# Patient Record
Sex: Female | Born: 1965 | Race: White | Hispanic: No | Marital: Married | Smoking: Former smoker
Health system: Southern US, Community
[De-identification: ages and names within clinical notes are randomized; demographics above are authoritative.]

## PROBLEM LIST (undated history)

## (undated) DIAGNOSIS — R109 Unspecified abdominal pain: Secondary | ICD-10-CM

## (undated) DIAGNOSIS — N63 Unspecified lump in unspecified breast: Secondary | ICD-10-CM

## (undated) DIAGNOSIS — R569 Unspecified convulsions: Secondary | ICD-10-CM

## (undated) DIAGNOSIS — L309 Dermatitis, unspecified: Secondary | ICD-10-CM

## (undated) DIAGNOSIS — N644 Mastodynia: Secondary | ICD-10-CM

## (undated) DIAGNOSIS — N61 Mastitis without abscess: Secondary | ICD-10-CM

## (undated) DIAGNOSIS — K589 Irritable bowel syndrome without diarrhea: Secondary | ICD-10-CM

## (undated) DIAGNOSIS — F32A Depression, unspecified: Secondary | ICD-10-CM

## (undated) DIAGNOSIS — N643 Galactorrhea not associated with childbirth: Secondary | ICD-10-CM

## (undated) DIAGNOSIS — N816 Rectocele: Secondary | ICD-10-CM

## (undated) DIAGNOSIS — L290 Pruritus ani: Secondary | ICD-10-CM

## (undated) DIAGNOSIS — F419 Anxiety disorder, unspecified: Secondary | ICD-10-CM

## (undated) DIAGNOSIS — G2579 Other drug induced movement disorders: Secondary | ICD-10-CM

## (undated) DIAGNOSIS — G9081 Serotonin syndrome: Secondary | ICD-10-CM

## (undated) DIAGNOSIS — K5790 Diverticulosis of intestine, part unspecified, without perforation or abscess without bleeding: Secondary | ICD-10-CM

## (undated) DIAGNOSIS — F329 Major depressive disorder, single episode, unspecified: Secondary | ICD-10-CM

## (undated) DIAGNOSIS — N189 Chronic kidney disease, unspecified: Secondary | ICD-10-CM

## (undated) DIAGNOSIS — R102 Pelvic and perineal pain unspecified side: Secondary | ICD-10-CM

## (undated) DIAGNOSIS — R6882 Decreased libido: Secondary | ICD-10-CM

## (undated) DIAGNOSIS — N8189 Other female genital prolapse: Secondary | ICD-10-CM

## (undated) DIAGNOSIS — D249 Benign neoplasm of unspecified breast: Secondary | ICD-10-CM

## (undated) DIAGNOSIS — IMO0002 Reserved for concepts with insufficient information to code with codable children: Secondary | ICD-10-CM

## (undated) HISTORY — PX: BREAST BIOPSY: SHX20

## (undated) HISTORY — PX: HERNIA REPAIR: SHX51

## (undated) HISTORY — DX: Reserved for concepts with insufficient information to code with codable children: IMO0002

## (undated) HISTORY — DX: Other female genital prolapse: N81.89

## (undated) HISTORY — DX: Galactorrhea not associated with childbirth: N64.3

## (undated) HISTORY — PX: OVARIAN CYST REMOVAL: SHX89

## (undated) HISTORY — DX: Rectocele: N81.6

## (undated) HISTORY — DX: Benign neoplasm of unspecified breast: D24.9

## (undated) HISTORY — PX: ABDOMINAL HYSTERECTOMY: SHX81

## (undated) HISTORY — DX: Pelvic and perineal pain unspecified side: R10.20

## (undated) HISTORY — DX: Decreased libido: R68.82

## (undated) HISTORY — DX: Unspecified abdominal pain: R10.9

## (undated) HISTORY — DX: Depression, unspecified: F32.A

## (undated) HISTORY — DX: Unspecified convulsions: R56.9

## (undated) HISTORY — DX: Major depressive disorder, single episode, unspecified: F32.9

## (undated) HISTORY — DX: Serotonin syndrome: G90.81

## (undated) HISTORY — PX: INCONTINENCE SURGERY: SHX676

## (undated) HISTORY — DX: Mastodynia: N64.4

## (undated) HISTORY — DX: Diverticulosis of intestine, part unspecified, without perforation or abscess without bleeding: K57.90

## (undated) HISTORY — DX: Mastitis without abscess: N61.0

## (undated) HISTORY — DX: Other drug induced movement disorders: G25.79

## (undated) HISTORY — DX: Pelvic and perineal pain: R10.2

## (undated) HISTORY — DX: Anxiety disorder, unspecified: F41.9

## (undated) HISTORY — DX: Irritable bowel syndrome, unspecified: K58.9

## (undated) HISTORY — DX: Unspecified lump in unspecified breast: N63.0

## (undated) HISTORY — DX: Pruritus ani: L29.0

## (undated) HISTORY — DX: Dermatitis, unspecified: L30.9

---

## 1998-04-10 ENCOUNTER — Other Ambulatory Visit: Admission: RE | Admit: 1998-04-10 | Discharge: 1998-04-10 | Payer: Self-pay | Admitting: Obstetrics and Gynecology

## 1998-08-04 ENCOUNTER — Other Ambulatory Visit: Admission: RE | Admit: 1998-08-04 | Discharge: 1998-08-04 | Payer: Self-pay | Admitting: Obstetrics & Gynecology

## 1999-02-21 ENCOUNTER — Inpatient Hospital Stay (HOSPITAL_COMMUNITY): Admission: AD | Admit: 1999-02-21 | Discharge: 1999-02-23 | Payer: Self-pay | Admitting: *Deleted

## 1999-02-25 ENCOUNTER — Encounter (HOSPITAL_COMMUNITY): Admission: RE | Admit: 1999-02-25 | Discharge: 1999-05-26 | Payer: Self-pay | Admitting: Gynecology

## 1999-08-18 ENCOUNTER — Other Ambulatory Visit: Admission: RE | Admit: 1999-08-18 | Discharge: 1999-08-18 | Payer: Self-pay | Admitting: *Deleted

## 2000-08-01 ENCOUNTER — Other Ambulatory Visit: Admission: RE | Admit: 2000-08-01 | Discharge: 2000-08-01 | Payer: Self-pay | Admitting: Obstetrics and Gynecology

## 2000-12-29 ENCOUNTER — Other Ambulatory Visit: Admission: RE | Admit: 2000-12-29 | Discharge: 2000-12-29 | Payer: Self-pay | Admitting: Obstetrics and Gynecology

## 2001-02-20 ENCOUNTER — Ambulatory Visit (HOSPITAL_COMMUNITY): Admission: RE | Admit: 2001-02-20 | Discharge: 2001-02-20 | Payer: Self-pay | Admitting: Obstetrics and Gynecology

## 2001-02-20 ENCOUNTER — Encounter: Payer: Self-pay | Admitting: Obstetrics and Gynecology

## 2001-07-13 ENCOUNTER — Inpatient Hospital Stay (HOSPITAL_COMMUNITY): Admission: AD | Admit: 2001-07-13 | Discharge: 2001-07-15 | Payer: Self-pay | Admitting: Obstetrics and Gynecology

## 2002-07-30 ENCOUNTER — Other Ambulatory Visit: Admission: RE | Admit: 2002-07-30 | Discharge: 2002-07-30 | Payer: Self-pay | Admitting: Obstetrics and Gynecology

## 2003-07-31 ENCOUNTER — Other Ambulatory Visit: Admission: RE | Admit: 2003-07-31 | Discharge: 2003-07-31 | Payer: Self-pay | Admitting: Obstetrics and Gynecology

## 2003-12-31 ENCOUNTER — Encounter: Payer: Self-pay | Admitting: Internal Medicine

## 2003-12-31 DIAGNOSIS — Z8719 Personal history of other diseases of the digestive system: Secondary | ICD-10-CM

## 2004-01-29 ENCOUNTER — Encounter: Admission: RE | Admit: 2004-01-29 | Discharge: 2004-01-29 | Payer: Self-pay | Admitting: Obstetrics and Gynecology

## 2004-02-05 ENCOUNTER — Ambulatory Visit: Payer: Self-pay | Admitting: Internal Medicine

## 2004-08-12 ENCOUNTER — Other Ambulatory Visit: Admission: RE | Admit: 2004-08-12 | Discharge: 2004-08-12 | Payer: Self-pay | Admitting: Obstetrics and Gynecology

## 2004-10-09 ENCOUNTER — Other Ambulatory Visit: Admission: RE | Admit: 2004-10-09 | Discharge: 2004-10-09 | Payer: Self-pay | Admitting: Obstetrics and Gynecology

## 2004-10-16 ENCOUNTER — Encounter: Admission: RE | Admit: 2004-10-16 | Discharge: 2004-10-16 | Payer: Self-pay | Admitting: Obstetrics and Gynecology

## 2005-08-23 ENCOUNTER — Other Ambulatory Visit: Admission: RE | Admit: 2005-08-23 | Discharge: 2005-08-23 | Payer: Self-pay | Admitting: Obstetrics and Gynecology

## 2005-11-01 ENCOUNTER — Encounter: Admission: RE | Admit: 2005-11-01 | Discharge: 2005-11-01 | Payer: Self-pay | Admitting: Obstetrics and Gynecology

## 2006-01-02 ENCOUNTER — Emergency Department (HOSPITAL_COMMUNITY): Admission: EM | Admit: 2006-01-02 | Discharge: 2006-01-02 | Payer: Self-pay | Admitting: Emergency Medicine

## 2006-06-17 ENCOUNTER — Encounter (INDEPENDENT_AMBULATORY_CARE_PROVIDER_SITE_OTHER): Payer: Self-pay | Admitting: *Deleted

## 2006-06-17 ENCOUNTER — Emergency Department (HOSPITAL_COMMUNITY): Admission: EM | Admit: 2006-06-17 | Discharge: 2006-06-17 | Payer: Self-pay | Admitting: Emergency Medicine

## 2006-06-23 ENCOUNTER — Ambulatory Visit: Payer: Self-pay | Admitting: Internal Medicine

## 2006-11-04 ENCOUNTER — Encounter: Admission: RE | Admit: 2006-11-04 | Discharge: 2006-11-04 | Payer: Self-pay | Admitting: Obstetrics and Gynecology

## 2006-12-09 ENCOUNTER — Ambulatory Visit: Payer: Self-pay | Admitting: Internal Medicine

## 2007-06-29 DIAGNOSIS — F329 Major depressive disorder, single episode, unspecified: Secondary | ICD-10-CM | POA: Insufficient documentation

## 2007-07-13 ENCOUNTER — Encounter: Admission: RE | Admit: 2007-07-13 | Discharge: 2007-07-13 | Payer: Self-pay | Admitting: Obstetrics and Gynecology

## 2007-11-24 ENCOUNTER — Encounter: Admission: RE | Admit: 2007-11-24 | Discharge: 2007-11-24 | Payer: Self-pay | Admitting: Obstetrics and Gynecology

## 2008-07-30 ENCOUNTER — Encounter: Admission: RE | Admit: 2008-07-30 | Discharge: 2008-07-30 | Payer: Self-pay | Admitting: Obstetrics and Gynecology

## 2008-09-23 ENCOUNTER — Telehealth: Payer: Self-pay | Admitting: Internal Medicine

## 2008-10-15 ENCOUNTER — Ambulatory Visit: Payer: Self-pay | Admitting: Internal Medicine

## 2008-10-15 DIAGNOSIS — K589 Irritable bowel syndrome without diarrhea: Secondary | ICD-10-CM

## 2008-10-21 ENCOUNTER — Ambulatory Visit: Payer: Self-pay | Admitting: Internal Medicine

## 2009-05-13 ENCOUNTER — Encounter (INDEPENDENT_AMBULATORY_CARE_PROVIDER_SITE_OTHER): Payer: Self-pay | Admitting: Obstetrics and Gynecology

## 2009-05-13 ENCOUNTER — Ambulatory Visit (HOSPITAL_COMMUNITY): Admission: RE | Admit: 2009-05-13 | Discharge: 2009-05-15 | Payer: Self-pay | Admitting: Obstetrics and Gynecology

## 2010-04-25 ENCOUNTER — Other Ambulatory Visit: Payer: Self-pay | Admitting: Obstetrics and Gynecology

## 2010-04-25 DIAGNOSIS — Z1239 Encounter for other screening for malignant neoplasm of breast: Secondary | ICD-10-CM

## 2010-04-26 ENCOUNTER — Encounter: Payer: Self-pay | Admitting: Obstetrics and Gynecology

## 2010-05-08 ENCOUNTER — Ambulatory Visit
Admission: RE | Admit: 2010-05-08 | Discharge: 2010-05-08 | Disposition: A | Discharge status: Home / Self Care | Payer: BC Managed Care – PPO | Source: Ambulatory Visit | Attending: Obstetrics and Gynecology | Admitting: Obstetrics and Gynecology

## 2010-05-08 DIAGNOSIS — Z1239 Encounter for other screening for malignant neoplasm of breast: Secondary | ICD-10-CM

## 2010-05-13 ENCOUNTER — Other Ambulatory Visit: Payer: Self-pay | Admitting: Obstetrics and Gynecology

## 2010-05-13 DIAGNOSIS — R928 Other abnormal and inconclusive findings on diagnostic imaging of breast: Secondary | ICD-10-CM

## 2010-05-20 ENCOUNTER — Other Ambulatory Visit: Payer: Self-pay | Admitting: Obstetrics and Gynecology

## 2010-05-20 ENCOUNTER — Ambulatory Visit
Admission: RE | Admit: 2010-05-20 | Discharge: 2010-05-20 | Disposition: A | Discharge status: Home / Self Care | Payer: BC Managed Care – PPO | Source: Ambulatory Visit | Attending: Obstetrics and Gynecology | Admitting: Obstetrics and Gynecology

## 2010-05-20 DIAGNOSIS — R928 Other abnormal and inconclusive findings on diagnostic imaging of breast: Secondary | ICD-10-CM

## 2010-05-20 DIAGNOSIS — N6310 Unspecified lump in the right breast, unspecified quadrant: Secondary | ICD-10-CM

## 2010-05-22 ENCOUNTER — Other Ambulatory Visit: Payer: Self-pay | Admitting: Obstetrics and Gynecology

## 2010-05-22 DIAGNOSIS — N6310 Unspecified lump in the right breast, unspecified quadrant: Secondary | ICD-10-CM

## 2010-05-25 ENCOUNTER — Other Ambulatory Visit: Payer: Self-pay | Admitting: Diagnostic Radiology

## 2010-05-25 ENCOUNTER — Ambulatory Visit
Admission: RE | Admit: 2010-05-25 | Discharge: 2010-05-25 | Disposition: A | Discharge status: Home / Self Care | Payer: BC Managed Care – PPO | Source: Ambulatory Visit | Attending: Obstetrics and Gynecology | Admitting: Obstetrics and Gynecology

## 2010-05-25 DIAGNOSIS — N6310 Unspecified lump in the right breast, unspecified quadrant: Secondary | ICD-10-CM

## 2010-06-24 LAB — CBC
Hemoglobin: 17.7 g/dL — ABNORMAL HIGH (ref 12.0–15.0)
MCHC: 33.8 g/dL (ref 30.0–36.0)
Platelets: 177 10*3/uL (ref 150–400)
RBC: 3.64 MIL/uL — ABNORMAL LOW (ref 3.87–5.11)
RBC: 5.2 MIL/uL — ABNORMAL HIGH (ref 3.87–5.11)
RDW: 12.7 % (ref 11.5–15.5)
WBC: 8.1 10*3/uL (ref 4.0–10.5)

## 2010-06-24 LAB — PROTIME-INR: INR: 0.94 (ref 0.00–1.49)

## 2010-06-24 LAB — BASIC METABOLIC PANEL
CO2: 32 mEq/L (ref 19–32)
Calcium: 8.3 mg/dL — ABNORMAL LOW (ref 8.4–10.5)
Calcium: 9.5 mg/dL (ref 8.4–10.5)
Creatinine, Ser: 0.72 mg/dL (ref 0.4–1.2)
GFR calc Af Amer: >60 mL/min (ref >60)
GFR calc non Af Amer: >60 mL/min (ref >60)
Glucose, Bld: 102 mg/dL — ABNORMAL HIGH (ref 70–99)
Glucose, Bld: 104 mg/dL — ABNORMAL HIGH (ref 70–99)
Sodium: 139 mEq/L (ref 135–145)

## 2010-06-24 LAB — DIFFERENTIAL
Basophils Absolute: 0 10*3/uL (ref 0.0–0.1)
Eosinophils Relative: 1 % (ref 0–5)
Lymphocytes Relative: 16 % (ref 12–46)
Lymphs Abs: 1.3 10*3/uL (ref 0.7–4.0)

## 2010-06-24 LAB — APTT: aPTT: 29 seconds (ref 24–37)

## 2010-06-24 LAB — TYPE AND SCREEN: Antibody Screen: NEGATIVE

## 2010-08-18 NOTE — Assessment & Plan Note (Signed)
[facility]                         GASTROENTEROLOGY OFFICE NOTE   NAME:[name], [name] MINARDI                 MRN:          [id number]  DATE:12/09/2006                            DOB:          [birth year]    [name] is a 45 year old white female who is followed by Dr. Jennelle Human  for severe depression.  We saw her initially in 2005 for nausea and  epigastric pain.  Upper endoscopy then showed minimal gastritis.  She  was treated with Zofran.  She has tried proton pump inhibitor without  much improvement.  She at that time was weighing only 103 pounds, she is  now up to about 113 pounds and did well for about 2 years but in the  last several months had exacerbation of severe depression and has  developed nausea, especially in the mornings which follows her through  the day.  There is no vomiting and there is no abdominal pain.  Upper  abdominal ultrasound was negative.  There has been no fever, no back  pain, no change in the bowel habits.   MEDICATIONS:  1. Mirena IUD.  2. Emsam patch 9 mg every 24 hours.  This is a MAO inhibitor which was      started about 3 weeks ago.  3. Zyprexa 7.5 mg p.o. daily.  4. Ativan 2 mg nightly.   She appeared depressed, crying.  LUNGS:  Clear to auscultation.  COR:  Normal S1, normal S2.  ABDOMEN:  Soft, nontender.  Normoactive bowel sounds, no distention, no  focal tenderness.  Liver edge at costal margin.  EXTREMITIES:  No edema.   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   PLAN:  1. Donnatal 1 p.o. b.i.d. to attempt to achieve an antispasmodic      effect.  2. Zofran 4 mg dispensed 30, one p.o. q. 8 hours p.r.n. nausea.  If no      better consider upper endoscopy.  3. Gaviscon p.r.n. indigestion.     Hedwig Morton. Juanda Chance, MD  Electronically Signed    DMB/MedQ  DD: 12/09/2006  DT: 12/09/2006  Job #: [id number]   cc:   [name], M.D.

## 2010-08-21 NOTE — Assessment & Plan Note (Signed)
[name]                         GASTROENTEROLOGY OFFICE NOTE   NAME:[name]                 MRN:          [id number]  DATE:06/23/2006                            DOB:          Nov 13, 1965    CHIEF COMPLAINT:  Nausea with dull pain in stomach.   HISTORY:  [name] is a 45 year old white woman previously seen by Dr.  Juanda [name] in 2005.  She had had nausea and epigastric pain at that time.  She had some minimal gastritis on an upper endoscopy.  She took AcipHex  and then an antispasmodic, which I think was hyoscyamine.  She could not  tolerate Reglan, and she improved, and had not been seen since.  More  recently, she developed problems.  She saw her primary care physician,  Dr. Caryl [name], who performed labs that included CBC, CMET, and lipase.  These were all normal.  She had extreme nausea with some light-  headedness and epigastric discomfort.  It was so bad, she went to the  emergency room on March 14.  She was evaluated there.  Repeat lipase was  normal.  An ultrasound was normal.  Dr. Juanda [name] was contacted and the  patient was offered an appointment at the office to follow up, and she  is here for that now.  Overall, she is significantly better, taking  Protonix 40 mg every day.  She is starting to eat better.  Light-  headedness is improved.   PAST MEDICAL HISTORY:  Notable for depression and anxiety, and those  things mentioned above.   FAMILY HISTORY:  Heart disease in her mother.  Father died of prostate  cancer.  Alcoholism in her family.   SOCIAL HISTORY:  She is married.  She lives with her husband, 2 sons,  and 1 daughter.  Review of our note shows that she has 1 child with  hyperactivity syndrome.  The patient relates that there is no real  change in stress level, but she considers her home life to be somewhat  stressful with the kids.  Occasional alcohol.  No tobacco or drugs.  She  informed me that I cared for her  sister-in-law when I was in practice in  [place], and in fact performed an endoscopy on the patient's husband to  look for celiac disease, as I had diagnosed it in her sister.  I do  remember them.   REVIEW OF SYSTEMS:  Eyeglasses.  Joint pain in the hands.  Night sweats,  usually with menses.  She has fatigue with her nausea.   PHYSICAL EXAM:  Height 5 feet 3-1/2 inches, weight 112 pounds, blood  pressure 120/70, pulse 72.  LUNGS:  Clear.  HEART:  S1, S2.  No rubs or gallops.  ABDOMEN:  Mildly tender in the epigastrium without organomegaly or mass.  LYMPHATICS:  No neck adenopathy.  EXTREMITIES:  No edema.  PSYCH:  She appears alert and oriented x3.  Does not appear anxious.  Affect is appropriate.   ASSESSMENT:  Epigastric pain and dyspepsia, nausea that is improving.  Workup is not revealing any serious cause at this time.  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  This is explained to the patient.  A viral or  bacterial gastritis could be causing this as well.  She does not really  have a lot of other constitutional symptoms, however.  The thought of  sprue comes to mind, but Dr. Juanda [name] took duodenal biopsies in the past  that were negative.   Note, she is on Cymbalta and a vitamin supplement.  Lorazepam at  bedtime.  Protonix.  She has a Mirena IUD.   There were no known drug allergies.  She has not had trouble with this  at night.  She takes her Ativan at bedtime.  She has not tried that  during the day.   PLAN:  1. Add Levbid.  The notes indicate that she did well with Levsin in      the past.  2. Should she continue to have symptoms, she should follow up with [place].      Dr. Juanda [name] would be available in the future.  She is on vacation      now.  I could see her if needed and Dr. Juanda [name] was not available.      Further plans pending clinical course.     Iva Boop, MD,FACG  Electronically Signed    CEG/MedQ  DD: 06/24/2006  DT: 06/24/2006  Job #: [id number]   cc:   [name] Morton. [name] Chance, MD

## 2010-08-21 NOTE — H&P (Signed)
[facility]  Patient:    [name], [name] Visit Number: [id number] MRN: [id number]          Service Type: OBS Location: [id number] Attending Physician:  Leonard Schwartz Dictated by:   Marcelle Smiling Clelia Croft, C.N.M. Admit Date:  07/13/2001                           History and Physical  DATE OF BIRTH:                09/14/1965  CHIEF COMPLAINT:              [name] is a 45 year old married white female, gravida 3 para 2, 0-0-2, at 38-2/7th weeks, who presents with regular uterine contractions this evening.  She denies leaking, bleeding, nausea and vomiting, headache, or visual disturbances.  Her pregnancy has been followed by the [facility] Certified Nurse Midwife service and has been remarkable for:                               1. History of depression.                               2. History of group B strep positive.                               3. History of PIH first pregnancy.                               4. Vulvar varicosities.                               5. Advanced maternal age, with no amnio.  HISTORY OF PRESENT PREGNANCY: She presented for care at approximately 10-1/[redacted] weeks gestation.  She stopped her Effexor and sleep medication both in early first trimester, and planned to follow up with her psychiatrist if depression increased.  At [redacted] weeks gestation she began with bilateral internal itching of the skin around her shoulder, with no evidence of external rash.  In the past she has had a rheumatoid work-up which was normal, as well as normal liver function tests.  She was given Claritin as well as hydrocortisone cream to use.  The itching continued on her shoulder.  At approximately [redacted] weeks gestation she was using Ben-Gay for comfort.  Her ultrasound at 18 weeks was done at [facility] of [place] and was within normal limits.  Vulvar varicosities progressively worsened during the pregnancy.   She was given a Z-Pak for productive cough at approximately [redacted] weeks gestation.  The rest of her prenatal care was unremarkable.  OBSTETRICAL LABORATORY DATA:  Collected on December 29, 2000, hemoglobin 15.1, hematocrit 45.5; platelets 185,000.  Blood type AB-positive.  Antibody negative.  RPR nonreactive.  Rubella immune.  Hepatitis B surface antigen negative.  Pap smear within normal limits.  Gonorrhea negative, Chlamydia negative.  On January 26, 2001 maternal serum alpha-fetoprotein was within normal range.  On April 19, 2001 her one hour glucola was 96 and her hemoglobin was 14.3.  Culture of the vaginal tract for group B strep on June 29, 2001 was positive.  OBSTETRICAL HISTORY:          1. She is gravida 3 para 2, 0-0-2.                               2. In 1998 she delivered a female infant via                                  vacuum extraction at [redacted] weeks gestation                                  after eight hours of labor.  He weighed                                  7 pounds 7 ounces.  His name is [name].                                  She had increased blood pressure the third                                  trimester with that pregnancy.  She was                                  positive for group B strep and also had a                                  fourth degree laceration.                               3. In November 2000 she vaginally delivered a                                  female infant at 38-5/7th weeks gestation                                  after four hours of labor.  Infant weighed                                  7 pounds 3 ounces.  Her name if [name].                                  She was positive for group B strep with                                  that pregnancy as well.  4. She has also had a history postpartum                                  depression.  ALLERGIES:                    No medication  allergies.  PAST MEDICAL HISTORY:         1. She reports using condoms for contraception                                  in the past.                               2. She has had a previous abnormal Pap smear                                  approximately three to five years ago, with                                  repeats normal.                               3. She reports having had the usual childhood                                  illnesses.                               4. She has had urinary tract infection x2 in the                                  past.                               5. History of depression, diagnosed at age 69                                  and followed by Dr. Jennelle Human, and has                                  usually taken Effexor as her primary                                  medication.                               6. The patient has been a previous smoker, and  stopped in 1996.  PAST SURGICAL HISTORY:        1. Wisdom teeth extraction at the age of 67.                               2. Hernia repair in 1986.  FAMILY HISTORY:               Remarkable for family member with angioplasty x2.  Maternal grandmother with borderline diabetes.  Paternal grandfather with rheumatoid arthritis.  Father with prostate cancer.  Father with history of depression and bipolar.  Siblings with bipolar.  Siblings with alcohol, tobacco, and drug use.  GENETIC HISTORY:              Remarkable for the patient being age 1 at the time of delivery.  SOCIAL HISTORY:               The patient is married to the father of the baby.  He is involved and supportive.  His name is Thelma Barge.  They are of the [place] faith.  They are both college educated.  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  They deny any alcohol, tobacco, or illicit drug use with the pregnancy.  PHYSICAL EXAMINATION:  VITAL SIGNS:                   Stable.  She is afebrile.  HEENT:                        Grossly within normal limits.  CHEST:                        Clear to auscultation.   HEART:                        Regular rate and rhythm.  BREAST:                       Soft, nontender.  ABDOMEN:                      Gravid contour, with reassuring fetal heart rate, as well as uterine contractions every three to four minutes.  PELVIC:                       Cervical examination is 5 cm, 80%, vertex presentation with a bulging bag of water per R.N. examination.  EXTREMITIES:                  Within normal limits.  ASSESSMENT:                   1. Intrauterine pregnancy at term.                               2. Active labor.                               3. Group B streptococci positive.  PLAN:                         1. Admit to birthing suite per consult  with Dr. Stefano Gaul.                               2. Routine CNM orders.                               3. Penicillin G for group B strep                                  prophylaxis.                               4. Plans epidural.                               5. Anticipated normal spontaneous vaginal                                  birth. Dictated by:   Marcelle Smiling Clelia Croft, C.N.M. Attending Physician:  Leonard Schwartz DD:  07/13/01 TD:  07/13/01 Job: [id number]

## 2011-05-18 ENCOUNTER — Other Ambulatory Visit: Payer: Self-pay | Admitting: Obstetrics and Gynecology

## 2011-05-18 DIAGNOSIS — Z1231 Encounter for screening mammogram for malignant neoplasm of breast: Secondary | ICD-10-CM

## 2011-06-08 ENCOUNTER — Ambulatory Visit: Payer: BC Managed Care – PPO

## 2011-08-05 ENCOUNTER — Ambulatory Visit
Admission: RE | Admit: 2011-08-05 | Discharge: 2011-08-05 | Disposition: A | Discharge status: Home / Self Care | Payer: BC Managed Care – PPO | Source: Ambulatory Visit | Attending: Obstetrics and Gynecology | Admitting: Obstetrics and Gynecology

## 2011-08-05 DIAGNOSIS — Z1231 Encounter for screening mammogram for malignant neoplasm of breast: Secondary | ICD-10-CM

## 2011-08-27 ENCOUNTER — Telehealth: Payer: Self-pay | Admitting: Obstetrics and Gynecology

## 2011-08-27 NOTE — Telephone Encounter (Signed)
Triage/epic 

## 2011-09-10 ENCOUNTER — Telehealth: Payer: Self-pay

## 2011-09-29 ENCOUNTER — Telehealth: Payer: Self-pay

## 2011-09-29 NOTE — Telephone Encounter (Signed)
Lm for pt to call back 09/10/11, no response from pt.

## 2011-09-29 NOTE — Telephone Encounter (Signed)
Lm for pt to call back on 09/10/11, no response from pt.

## 2011-10-27 ENCOUNTER — Encounter: Payer: Self-pay | Admitting: Obstetrics and Gynecology

## 2012-01-03 ENCOUNTER — Ambulatory Visit (INDEPENDENT_AMBULATORY_CARE_PROVIDER_SITE_OTHER): Payer: BC Managed Care – PPO | Admitting: Obstetrics and Gynecology

## 2012-01-03 ENCOUNTER — Ambulatory Visit (INDEPENDENT_AMBULATORY_CARE_PROVIDER_SITE_OTHER): Payer: BC Managed Care – PPO

## 2012-01-03 ENCOUNTER — Encounter: Payer: Self-pay | Admitting: Obstetrics and Gynecology

## 2012-01-03 ENCOUNTER — Other Ambulatory Visit: Payer: Self-pay | Admitting: Obstetrics and Gynecology

## 2012-01-03 VITALS — BP 94/70 | Temp 98.1°F | Wt 124.0 lb

## 2012-01-03 DIAGNOSIS — R3915 Urgency of urination: Secondary | ICD-10-CM

## 2012-01-03 DIAGNOSIS — K594 Anal spasm: Secondary | ICD-10-CM

## 2012-01-03 DIAGNOSIS — N83209 Unspecified ovarian cyst, unspecified side: Secondary | ICD-10-CM

## 2012-01-03 DIAGNOSIS — R102 Pelvic and perineal pain: Secondary | ICD-10-CM

## 2012-01-03 DIAGNOSIS — N949 Unspecified condition associated with female genital organs and menstrual cycle: Secondary | ICD-10-CM

## 2012-01-03 LAB — POCT URINALYSIS DIPSTICK
Leukocytes, UA: NEGATIVE
Nitrite, UA: NEGATIVE
Protein, UA: NEGATIVE
Urobilinogen, UA: NEGATIVE

## 2012-01-03 NOTE — Progress Notes (Signed)
46 YO complains of back pain, bloating, urinary urgency (no incontinence) and rectal pain x several months-off & on.  Seemingly bloating is worse at night (no change in diet) and sometimes it is difficult to start urine flow though often has urgency. Over the past year will monthly have severe spasm of rectal area and has to sit on toilet and do deep breathing to relieve it.  PMH: LAVH/TVT/A-P repair/left ovarian cystectomy- 2011, PMS, galactorrhea O:  U/A-negative  Abdomen: Pelvic: EGBUS-wnl, vagina-normal, uterus/cervix-surgically absent, right adnexal tenderness without masses, left adnexa-no tenderness Anus: fissures    A:  Proctalgia Fugax      Pelvic Discomfort      Bloating      Urinary Urgency/Hesitancy      Lower Back Pain      Complex Right Ovarian Follicle (history of ovarian cyst)  P: Pelvic U/S-uterus/cervix-surgically absent, normal appearing left ovary, right ovary with complex follicle-2.4 x 1.8 x 2.0 cm      Preparation-H as directed      Follow up with GI for proctalgia and bloating symtpoms     To consult Dr. Normand Sloop about further management of urinary symptoms      Repeat U/S in 4-6 weeks      RTO-as scheduled or prn  Danene Montijo, PA-C

## 2012-01-03 NOTE — Patient Instructions (Signed)
Proctalgia Fugax Proctalgia fugax is a very short episode of intense rectal pain. It can last from seconds to minutes. It often occurs in the night, and awakens the person from sleep. It is not a sign of cancer.  CAUSES  The cause of this often intense rectal pain is not known. One possible cause may be spasm of the pelvic muscles or of the lowest part of the large intestine.  SYMPTOMS  The pain of proctalgia fugax:  Is intensely severe.   Lasts from only a few seconds to thirty minutes.   Usually awakens the person from sleep.  DIAGNOSIS  In order to make sure that there are no other problems, diagnostic tests may be done such as:   Anoscopy. This is a lighted scope that is put into the rectum to look for abnormalities.   Barium enema. X-rays are taken after administering a radio-sensitive material.  TREATMENT  A number of things have been used to try to treat this condition, including:  Medications.   Warm baths.   Relaxation techniques.   Gentle massage of the painful area.  HOME CARE INSTRUCTIONS   Take all medications exactly as directed.   Follow any prescribed diet.   Follow instructions regarding both rest and physical activity.   Learn progressive relaxation techniques.  SEEK IMMEDIATE MEDICAL CARE IF:   Your pain does not get better in the usual amount of time.   You develop any new symptoms.  Document Released: 12/15/2000 Document Revised: 03/11/2011 Document Reviewed: 05/23/2008 [facility] Patient Information 2012 [place], [place].Anal Fissure, Adult An anal fissure is a small tear or crack in the skin around the anus. Bleeding from a fissure usually stops on its own within a few minutes. However, bleeding will often reoccur with each bowel movement until the crack heals.  CAUSES   Passing large, hard stools.   Frequent diarrheal stools.   Constipation.   Inflammatory bowel disease (Crohn's disease or ulcerative colitis).   Infections.   Anal sex.    SYMPTOMS   Small amounts of blood seen on your stools, on toilet paper, or in the toilet after a bowel movement.   Rectal bleeding.   Painful bowel movements.   Itching or irritation around the anus.  DIAGNOSIS Your caregiver will examine the anal area. An anal fissure can usually be seen with careful inspection. A rectal exam may be performed and a short tube (anoscope) may be used to examine the anal canal. TREATMENT   You may be instructed to take fiber supplements. These supplements can soften your stool to help make bowel movements easier.   Sitz baths may be recommended to help heal the tear. Do not use soap in the sitz baths.   A medicated cream or ointment may be prescribed to lessen discomfort.  HOME CARE INSTRUCTIONS   Maintain a diet high in fruits, whole grains, and vegetables. Avoid constipating foods like bananas and dairy products.   Take sitz baths as directed by your caregiver.   Drink enough fluids to keep your urine clear or pale yellow.   Only take over-the-counter or prescription medicines for pain, discomfort, or fever as directed by your caregiver. Do not take aspirin as this may increase bleeding.   Do not use ointments containing numbing medications (anesthetics) or hydrocortisone. They could slow healing.  SEEK MEDICAL CARE IF:   Your fissure is not completely healed within 3 days.   You have further bleeding.   You have a fever.   You have  diarrhea mixed with blood.   You have pain.   Your problem is getting worse rather than better.  MAKE SURE YOU:   Understand these instructions.   Will watch your condition.   Will get help right away if you are not doing well or get worse.  Document Released: 03/22/2005 Document Revised: 03/11/2011 Document Reviewed: 09/06/2010 [facility] Patient Information 2012 [place], [place].

## 2012-02-15 ENCOUNTER — Ambulatory Visit (INDEPENDENT_AMBULATORY_CARE_PROVIDER_SITE_OTHER): Payer: BC Managed Care – PPO | Admitting: Obstetrics and Gynecology

## 2012-02-15 ENCOUNTER — Encounter: Payer: Self-pay | Admitting: Obstetrics and Gynecology

## 2012-02-15 ENCOUNTER — Ambulatory Visit (INDEPENDENT_AMBULATORY_CARE_PROVIDER_SITE_OTHER): Payer: BC Managed Care – PPO

## 2012-02-15 VITALS — BP 102/70 | HR 70 | Wt 125.0 lb

## 2012-02-15 DIAGNOSIS — R102 Pelvic and perineal pain unspecified side: Secondary | ICD-10-CM

## 2012-02-15 DIAGNOSIS — N83201 Unspecified ovarian cyst, right side: Secondary | ICD-10-CM

## 2012-02-15 DIAGNOSIS — N949 Unspecified condition associated with female genital organs and menstrual cycle: Secondary | ICD-10-CM

## 2012-02-15 DIAGNOSIS — N83209 Unspecified ovarian cyst, unspecified side: Secondary | ICD-10-CM

## 2012-02-15 NOTE — Progress Notes (Signed)
46 YO seen in September with proctalgia fugax, pelvic pain & urinary urgency.  States that pain is some what less and has not had any change in urgency and no further rectal spasms since last visit.  O: U/S: uterus/cervix-surgically absent; right ovary with a simple dominant follicle 2.4 x 1.9 x 1.8 cm (virtually unchanged from September study except      the cyst has a simple instead of a complex component; left ovary not visualized   A: Stable Right Ovarian Cyst     Decreased Pelvic Discomfort  P:  Notify office of any worsening symptoms       Repeat U/S in 8 weeks      RTO-as scheduled or prn  Johnathan Heskett, PA-C

## 2012-04-11 ENCOUNTER — Other Ambulatory Visit: Payer: BC Managed Care – PPO

## 2012-04-11 ENCOUNTER — Encounter: Payer: BC Managed Care – PPO | Admitting: Obstetrics and Gynecology

## 2012-06-29 ENCOUNTER — Other Ambulatory Visit: Payer: Self-pay

## 2012-06-29 DIAGNOSIS — Z1231 Encounter for screening mammogram for malignant neoplasm of breast: Secondary | ICD-10-CM

## 2012-08-08 ENCOUNTER — Ambulatory Visit: Payer: Self-pay

## 2012-08-31 ENCOUNTER — Ambulatory Visit (INDEPENDENT_AMBULATORY_CARE_PROVIDER_SITE_OTHER): Payer: BC Managed Care – PPO | Admitting: Neurology

## 2012-08-31 ENCOUNTER — Encounter: Payer: Self-pay | Admitting: Neurology

## 2012-08-31 VITALS — BP 120/75 | HR 94 | Ht 63.75 in | Wt 117.0 lb

## 2012-08-31 DIAGNOSIS — R209 Unspecified disturbances of skin sensation: Secondary | ICD-10-CM | POA: Insufficient documentation

## 2012-08-31 MED ORDER — GABAPENTIN 300 MG PO CAPS: 300.0000 mg | ORAL_CAPSULE | Freq: Every day | ORAL | Status: DC
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
## 2012-09-05 ENCOUNTER — Ambulatory Visit
Admission: RE | Admit: 2012-09-05 | Discharge: 2012-09-05 | Disposition: A | Discharge status: Home / Self Care | Payer: BC Managed Care – PPO | Source: Ambulatory Visit

## 2012-09-05 DIAGNOSIS — Z1231 Encounter for screening mammogram for malignant neoplasm of breast: Secondary | ICD-10-CM

## 2013-01-02 ENCOUNTER — Other Ambulatory Visit: Payer: Self-pay | Admitting: Neurology

## 2013-03-13 ENCOUNTER — Ambulatory Visit: Payer: BC Managed Care – PPO | Admitting: Neurology

## 2013-04-26 ENCOUNTER — Other Ambulatory Visit: Payer: Self-pay | Admitting: Obstetrics and Gynecology

## 2013-04-26 DIAGNOSIS — R102 Pelvic and perineal pain: Secondary | ICD-10-CM

## 2013-05-01 ENCOUNTER — Other Ambulatory Visit: Payer: BC Managed Care – PPO

## 2013-05-03 ENCOUNTER — Ambulatory Visit
Admission: RE | Admit: 2013-05-03 | Discharge: 2013-05-03 | Disposition: A | Discharge status: Home / Self Care | Payer: BC Managed Care – PPO | Source: Ambulatory Visit | Attending: Obstetrics and Gynecology | Admitting: Obstetrics and Gynecology

## 2013-05-03 DIAGNOSIS — R102 Pelvic and perineal pain: Secondary | ICD-10-CM

## 2013-05-03 MED ORDER — IOHEXOL 300 MG/ML  SOLN
100.0000 mL | Freq: Once | INTRAMUSCULAR | Status: AC | PRN
  Administered 2013-05-03: 100 mL via INTRAVENOUS

## 2013-08-07 ENCOUNTER — Other Ambulatory Visit: Payer: Self-pay

## 2013-08-07 DIAGNOSIS — Z1231 Encounter for screening mammogram for malignant neoplasm of breast: Secondary | ICD-10-CM

## 2013-08-16 ENCOUNTER — Ambulatory Visit (INDEPENDENT_AMBULATORY_CARE_PROVIDER_SITE_OTHER): Payer: BC Managed Care – PPO | Admitting: Psychiatry

## 2013-08-16 ENCOUNTER — Encounter (HOSPITAL_COMMUNITY): Payer: Self-pay | Admitting: Psychiatry

## 2013-08-16 ENCOUNTER — Encounter (INDEPENDENT_AMBULATORY_CARE_PROVIDER_SITE_OTHER): Payer: Self-pay

## 2013-08-16 VITALS — BP 129/77 | HR 78 | Ht 63.0 in | Wt 120.0 lb

## 2013-08-16 DIAGNOSIS — F331 Major depressive disorder, recurrent, moderate: Secondary | ICD-10-CM

## 2013-08-16 NOTE — Progress Notes (Signed)
Psychiatric Assessment Adult  Patient Identification:  CELINE DISHMAN Date of Evaluation:  08/16/2013 Chief Complaint: [place] consult  History of Chief Complaint:  No chief complaint on file.   HPI Patient is 48 years old Caucasian female, with MDD, recurrent moderate-severe, since 1994. She thinks she had depressive symptoms, at age 54, prior to receiving treatment. She had post partum depression in the past, with one of her kids.Family history with father having depression, and ECT treatment x 2. Cutting at 80, one time in suicide attempt. She has received the following meds: paxil, prozac, zoloft, lexapro, effexor, cymbalta, pristiq, wellbutrin, imipramine, brintellix, lithium, latuda, mirtazapine. Currently she takes viibryd 40 mg po and abilify 7.5 mg po, vyanse 60 mg po QD, prosom 2 mg hs, and gabapentin 300 mg hs. Extensive psychotherapy in the past, last time was 5 years ago. Beck Score is 22, which is moderate; PHQ9-19, which is moderate range.  She denies psychotic or neurological conditions that would preclude her from [place]. She denies any tumors, metal devices, implants, pacemakers, stents, ICP, head traumas. She denies pregnancy, and she's not nursing. Depressive Symptoms are listed below. She works helping an Physiological scientist; she finds depression disabling and affecting all domains in her life. Calm, and cooperative. Currently, she denies SI/HI/AVH.  Review of Systems Physical Exam  Depressive Symptoms: depressed mood, anhedonia, insomnia, hypersomnia, psychomotor retardation, fatigue, feelings of worthlessness/guilt, difficulty concentrating, hopelessness, impaired memory, anxiety, panic attacks, loss of energy/fatigue, disturbed sleep, weight loss, decreased appetite,  (Hypo) Manic Symptoms:   Elevated Mood:  No Irritable Mood:  Yes Grandiosity:  No Distractibility:  Yes Labiality of Mood:  No Delusions:  No Hallucinations:  No Impulsivity:   No Sexually Inappropriate Behavior:  No Financial Extravagance:  No Flight of Ideas:  No  Anxiety Symptoms: Excessive Worry:  Yes Panic Symptoms:  Yes Agoraphobia:  No Obsessive Compulsive: Yes  Symptoms: obsessive worries Specific Phobias:  No Social Anxiety:  Yes  Psychotic Symptoms:  Hallucinations: No None Delusions:  No Paranoia:  No   Ideas of Reference:  No  PTSD Symptoms: Ever had a traumatic exposure:  No Had a traumatic exposure in the last month:  No Re-experiencing: No None Hypervigilance:  No Hyperarousal: No None Avoidance: No None  Traumatic Brain Injury: No   Past Psychiatric History: Diagnosis: MDD, recurrent, moderate   Hospitalizations: None   Outpatient Care: yes   Substance Abuse Care: none   Self-Mutilation: none   Suicidal Attempts: at 13 cut wrists   Violent Behaviors: none    Past Medical History:   Past Medical History  Diagnosis Date  . Galactorrhea   . Pelvic relaxation   . Anal itching   . Cystocele   . Rectocele   . Depression   . Decreased libido   . Breast nodule     left  . Pelvic pain in female   . Abdominal pain   . Breast inflammation   . Mastodynia   . Fibroadenoma    History of Loss of Consciousness:  No Seizure History:  No Cardiac History:  No Allergies:  No Known Allergies Current Medications:  Current Outpatient Prescriptions  Medication Sig Dispense Refill  . ARIPiprazole (ABILIFY) 5 MG tablet Take 7.5 mg by mouth daily.       Marland Kitchen estazolam (PROSOM) 2 MG tablet Take 2 mg by mouth daily.      Marland Kitchen gabapentin (NEURONTIN) 300 MG capsule TAKE ONE CAPSULE BY MOUTH EVERY DAY AT BEDTIME  30 capsule  3  . VIIBRYD 40 MG TABS Take 40 mg by mouth daily.      Marland Kitchen VYVANSE 60 MG capsule Take 60 mg by mouth daily.       No current facility-administered medications for this visit.    Previous Psychotropic Medications:  Medication Dose                          Substance Abuse History in the last 12  months: Substance Age of 1st Use Last Use Amount Specific Type  Nicotine  12  one week ago  10-20 cigs/daily   on chantix  Alcohol  20  one week ago  1 glass of wine    Cannabis      Opiates      Cocaine      Methamphetamines      LSD      Ecstasy      Benzodiazepines [age]  1 mg  lorazepamprn    Caffeine      Inhalants      Others:                         Social History: Current Place of Residence: [place] of Birth: [place]  Family Members: Husband and 3 kids (19, 65, 3) Marital Status:  Married Children:   Sons: 2  Daughters: 1 Relationships: yes  Education:  Dentist Problems/Performance: regular classes  Religious Beliefs/Practices: Christian  History of Abuse: none 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 Legal History: none  Hobbies/Interests: not really   Family History:   Family History  Problem Relation Age of Onset  . Heart disease Mother     angioplasty  . Cancer Father     prostate  . Hypertension Father   . Depression Father   . Bipolar disorder Father   . Hypertension Maternal Grandmother   . Diabetes Maternal Grandmother     borderline  . Hypertension Paternal Grandmother   . Arthritis Paternal Grandmother   . Stroke Paternal Grandmother   . Rheum arthritis Paternal Grandfather   . Rheum arthritis Sister   . Rheum arthritis Brother     Mental Status Examination/Evaluation: Objective:  Appearance: Casual  Eye Contact::  Fair  Speech:  Clear and Coherent  Volume:  Decreased  Mood:  Dysphoric  Affect:  Constricted and Depressed  Thought Process:  Goal Directed, Linear and Logical  Orientation:  Full (Time, Place, and Person)  Thought Content:  Obsessions and Rumination  Suicidal Thoughts:  No  Homicidal Thoughts:  No  Judgement:  Fair  Insight:  Fair  Psychomotor Activity:  Decreased  Akathisia:  No  Handed:  Right  AIMS (if indicated):  No Abnormal Movement    Assets:  Intimacy Leisure Time Physical Health Resilience Social Support    Laboratory/X-Ray Psychological Evaluation(s)   NA Dr. Dwyane Dee, MD/Abigal Choung   Assessment:  Axis I: MDD, recurrent, moderate  AXIS I MDD, recurrent, moderate  AXIS II Deferred  AXIS III Past Medical History  Diagnosis Date  . Galactorrhea   . Pelvic relaxation   . Anal itching   . Cystocele   . Rectocele   . Depression   . Decreased libido   . Breast nodule     left  . Pelvic pain in female   . Abdominal pain   . Breast inflammation   .  Mastodynia   . Fibroadenoma      AXIS IV economic problems, educational problems, housing problems, occupational problems, other psychosocial or environmental problems, problems related to legal system/crime, problems related to social environment, problems with access to health care services and problems with primary support group  AXIS V 51-60 moderate symptoms   Treatment Plan/Recommendations: Patient is 48 years old Caucasian female, with MDD, recurrent moderate-severe, since 1994. She thinks she had depressive symptoms, at age 76, prior to receiving treatment. She had post partum depression in the past, with one of her kids.Family history with father having depression, and ECT treatment x 2. Cutting at 45, one time in suicide attempt. She has received the following meds: paxil, prozac, zoloft, lexapro, effexor, cymbalta, pristiq, wellbutrin, imipramine, brintellix, lithium, latuda, mirtazapine. Currently she takes viibryd 40 mg po and abilify 7.5 mg po, vyanse 40 mg po QD. Extensive psychotherapy in the past, last time was 5 years ago. Beck Score is 22, which is moderate; PHQ9-19, which is moderate range. She denies psychotic or neurological conditions that would preclude her from [place]. She denies any tumors, metal devices, implants, pacemakers, stents, ICP, head traumas. She denies pregnancy, and she's not nursing. Depressive Symptoms are listed below. She works  helping an Physiological scientist; she finds depression disabling and affecting all domains in her life. Calm, and cooperative. Currently, she denies SI/HI/AVH. Discussed R/R/B/O of [place] with patient. Patient has moderate depression and does not meed eligibility at this time. Patient will come back, if she has a severe episode.     Plan of Care: Medications  Laboratory:  NA  Psychotherapy: none   Medications: abilify 7.5 mg po QD, viibryd 40 mg po QD, Vyvanse 60 mg po QD, Prosom 2mg  HS, and gabapentin 300 mg hs.   Routine PRN Medications:  No  Consultations: as needed   Safety Concerns:  none  Other:      Madison Hickman, NP 5/14/20151:18 PM

## 2013-09-06 ENCOUNTER — Ambulatory Visit: Payer: BC Managed Care – PPO

## 2013-09-13 ENCOUNTER — Ambulatory Visit: Payer: BC Managed Care – PPO

## 2013-10-11 ENCOUNTER — Encounter (INDEPENDENT_AMBULATORY_CARE_PROVIDER_SITE_OTHER): Payer: Self-pay

## 2013-10-11 ENCOUNTER — Ambulatory Visit
Admission: RE | Admit: 2013-10-11 | Discharge: 2013-10-11 | Disposition: A | Discharge status: Home / Self Care | Payer: BC Managed Care – PPO | Source: Ambulatory Visit

## 2013-10-11 ENCOUNTER — Ambulatory Visit: Payer: BC Managed Care – PPO

## 2013-10-11 DIAGNOSIS — Z1231 Encounter for screening mammogram for malignant neoplasm of breast: Secondary | ICD-10-CM

## 2013-10-25 ENCOUNTER — Encounter: Payer: Self-pay | Admitting: Internal Medicine

## 2013-10-25 ENCOUNTER — Telehealth (HOSPITAL_COMMUNITY): Payer: Self-pay | Admitting: *Deleted

## 2013-10-25 NOTE — Telephone Encounter (Signed)
Pt was seen in clinic on 08/16/13 for [place] consultation, but did not rate in the severe category of depression at that time. Called pt to follow up re: current level of depression. Pt stated "I am doing really well, actually." Pt stated that her depression has not worsened at all since her consultation. Pt stated that she would prefer to call this writer if her depression worsens rather than having this writer follow up with her periodically.

## 2013-12-15 ENCOUNTER — Encounter: Payer: Self-pay | Admitting: Internal Medicine

## 2014-02-04 ENCOUNTER — Encounter (HOSPITAL_COMMUNITY): Payer: Self-pay | Admitting: Psychiatry

## 2014-05-14 ENCOUNTER — Other Ambulatory Visit: Payer: Self-pay | Admitting: Obstetrics and Gynecology

## 2014-07-04 ENCOUNTER — Other Ambulatory Visit: Payer: Self-pay | Admitting: Dermatology

## 2014-08-06 ENCOUNTER — Encounter (HOSPITAL_COMMUNITY): Payer: Self-pay | Admitting: Psychiatry

## 2014-08-06 ENCOUNTER — Ambulatory Visit (INDEPENDENT_AMBULATORY_CARE_PROVIDER_SITE_OTHER): Payer: BLUE CROSS/BLUE SHIELD | Admitting: Psychiatry

## 2014-08-06 VITALS — BP 126/80 | HR 80 | Ht 63.5 in | Wt 128.2 lb

## 2014-08-06 DIAGNOSIS — F332 Major depressive disorder, recurrent severe without psychotic features: Secondary | ICD-10-CM | POA: Diagnosis not present

## 2014-08-06 NOTE — Progress Notes (Signed)
[place] [phone] Progress Note  [name] [id number] 49 y.o.  08/06/2014 4:38 PM  Chief Complaint: depression got worse  History of Present Illness: Pt was evaluated for [place] in May 2015. At the time she was moderately depressed. Here today due to worsening depression.  Pt was diagnosed with Serotonin syndrome a few weeks ago by her PCP. Her meds were cut in half and symptoms have improved.    Pt see's Dr. Curt Jews for MDD treatment. States she has been fighting depression for 25 yrs. States depression is getting worse. Reports low motivation, low energy, anxiety, poor hygiene, not doing house work, more irritable, isolation, body aches, worthlessness and hopelessness. Pt has to push herself to do to anything.   Denies crying spells but wishes she could.  Anxiety is increased. Sometimes when she has to make decisions. She is easily overwhelmed.  Pt is sleeping good with Xanax. She gets up at 4:30am and works out for 1 hrs 5 days a week. Energy is low. Appetite is decreased but denies weight changes. Concentration is poor and she is forgetting a lot.   Denies manic and hypomanic symptoms including periods of decreased need for sleep, increased energy, mood lability, impulsivity, FOI, and excessive spending.  Pt is pre-menopausal as confirmed by her Gyn. Pt also has low testosterone.   Pt denies any metal placement in the body. She has one tattoo on her toe.    States meds are keeping from having more severe depression symptoms but they are not helping much. States she tried numerous antidepressants (see previous evaluation from May 2015).  She is not currently in therapy. States she has tried it several times and it is no longer helping.   Suicidal Ideation: No Plan Formed: No Patient has means to carry out plan: No wants to live for her kids  Homicidal Ideation: No Plan Formed: No Patient has means to carry out plan: No  Review of  Systems: Psychiatric: Agitation: Yes Hallucination: Yes Depressed Mood: Yes Insomnia: No Hypersomnia: No Altered Concentration: No Feels Worthless: Yes Grandiose Ideas: No Belief In Special Powers: No New/Increased Substance Abuse: Yes smoking 1/2 ppd. Denies illicit drug use. Drinks about once a month.  Compulsions: No  Neurologic: Headache: No Seizure: No Paresthesias: Yes arms. Evaluated by neurologist   Review of Systems  Constitutional: Negative for fever, chills and weight loss.  HENT: Negative for congestion, ear pain, nosebleeds, sore throat and tinnitus.   Eyes: Negative for blurred vision, double vision and pain.  Respiratory: Negative for cough, sputum production and shortness of breath.   Cardiovascular: Negative for chest pain, palpitations and leg swelling.  Gastrointestinal: Negative for heartburn, nausea, vomiting and abdominal pain.  Musculoskeletal: Positive for myalgias, back pain, joint pain and neck pain.  Skin: Positive for itching. Negative for rash.  Neurological: Positive for sensory change. Negative for dizziness, seizures, loss of consciousness and headaches.  Psychiatric/Behavioral: Positive for depression. Negative for suicidal ideas, hallucinations and substance abuse. The patient is nervous/anxious. The patient does not have insomnia.       Past Medical Family, Social History: married and has 3 kids. Works for an Field seismologist.   Outpatient Encounter Prescriptions as of 08/06/2014  Medication Sig  . ALPRAZolam (XANAX) 1 MG tablet Take 3 mg by mouth at bedtime as needed for anxiety.  . Brexpiprazole 1 MG TABS Take 1.5 mg by mouth daily.  Marland Kitchen VIIBRYD 40 MG TABS Take 40 mg by mouth daily.  Marland Kitchen VYVANSE 60 MG  capsule Take 60 mg by mouth daily.  . ARIPiprazole (ABILIFY) 5 MG tablet Take 7.5 mg by mouth daily.   Marland Kitchen estazolam (PROSOM) 2 MG tablet Take 2 mg by mouth daily.  Marland Kitchen gabapentin (NEURONTIN) 300 MG capsule TAKE ONE CAPSULE BY MOUTH EVERY DAY AT  BEDTIME (Patient not taking: Reported on 08/06/2014)   No facility-administered encounter medications on file as of 08/06/2014.    Past Psychiatric History/Hospitalization(s): Anxiety: No Bipolar Disorder: No Depression: Yes Mania: No Psychosis: No Schizophrenia: No Personality Disorder: No Hospitalization for psychiatric illness: No History of Electroconvulsive Shock Therapy: No Prior Suicide Attempts: Yes  Physical Exam: Constitutional:  LMP 05/20/2010  General Appearance: alert, oriented, no acute distress  Musculoskeletal: Strength & Muscle Tone: within normal limits Gait & Station: normal Patient leans: N/A  Mental Status Examination/Evaluation: Objective: Attitude: Calm and cooperative  Appearance: Casual, appears to be stated age  Eye Contact::  Fair  Speech:  Clear and Coherent and Normal Rate  Volume:  Normal  Mood:  depressed  Affect:  Depressed and Tearful  Thought Process:  Goal Directed  Orientation:  Full (Time, Place, and Person)  Thought Content:  Negative  Suicidal Thoughts:  No  Homicidal Thoughts:  No  Judgement:  Intact  Insight:  Present  Concentration: good  Memory: Immediate-fair Recent-fair Remote-fair  Recall: fair  Language: fair  Gait and Station: normal  [employer] of Knowledge: average  Psychomotor Activity:  Normal  Akathisia:  No  Handed:  Right  AIMS (if indicated):  Facial and Oral Movements  Muscles of Facial Expression: None, normal  Lips and Perioral Area: None, normal  Jaw: None, normal  Tongue: None, normal Extremity Movements: Upper (arms, wrists, hands, fingers): None, normal  Lower (legs, knees, ankles, toes): None, normal,  Trunk Movements:  Neck, shoulders, hips: None, normal,  Overall Severity : Severity of abnormal movements (highest score from questions above): None, normal  Incapacitation due to abnormal movements: None, normal  Patient's awareness of abnormal movements (rate only patient's report): No  Awareness, Dental Status  Current problems with teeth and/or dentures?: No  Does patient usually wear dentures?: No    Assets:  Communication Skills Desire for Improvement Financial Resources/Insurance Housing Intimacy Leisure Time Physical Health Social Support Engineer, drilling (Choose Three): Review of Psycho-Social Stressors (1), Established Problem, Worsening (2) and Review or order medicine tests (1)  Assessment: Axis I: MDD- severe, recurrent without psychotic features  Axis II: deferred  Axis III:  Past Medical History  Diagnosis Date  . Galactorrhea   . Pelvic relaxation   . Anal itching   . Cystocele   . Rectocele   . Depression   . Decreased libido   . Breast nodule     left  . Pelvic pain in female   . Abdominal pain   . Breast inflammation   . Mastodynia   . Fibroadenoma   . Serotonin syndrome     Axis IV: poor coping skills  Axis V: GAF 51-60   Plan: BECK= 32 PHQ 9= 22  Pt is a good TMS candidiate. Reviewed risks/benefits, SE and treatment plan.   Meds as prescribed by primary psychiatrist  Charlcie Cradle, MD 08/06/2014

## 2014-09-05 ENCOUNTER — Other Ambulatory Visit (HOSPITAL_COMMUNITY): Payer: BLUE CROSS/BLUE SHIELD | Admitting: Psychiatry

## 2014-09-12 ENCOUNTER — Other Ambulatory Visit (HOSPITAL_COMMUNITY): Payer: BLUE CROSS/BLUE SHIELD | Attending: Psychiatry | Admitting: Psychiatry

## 2014-09-12 VITALS — BP 130/81 | HR 75 | Ht 63.0 in | Wt 129.4 lb

## 2014-09-12 DIAGNOSIS — F332 Major depressive disorder, recurrent severe without psychotic features: Secondary | ICD-10-CM | POA: Diagnosis present

## 2014-09-13 ENCOUNTER — Other Ambulatory Visit (INDEPENDENT_AMBULATORY_CARE_PROVIDER_SITE_OTHER): Payer: BLUE CROSS/BLUE SHIELD | Admitting: *Deleted

## 2014-09-13 VITALS — BP 154/84 | HR 68

## 2014-09-13 DIAGNOSIS — F332 Major depressive disorder, recurrent severe without psychotic features: Secondary | ICD-10-CM | POA: Diagnosis not present

## 2014-09-13 NOTE — Progress Notes (Signed)
Patient ID: [name], female   DOB: [birth year], 49 y.o.   MRN: [id number] Pt reported to [facility] for Repetitive Transcranial Magnetic Stimulation treatment for Major Depressive Disorder. Pt reported that she experienced no headache or other side effect after her first tx session the previous day. Pt reported no change in medication regimen, alcohol/substance use, caffeine consumption, sleep pattern, or metal implant status since previous tx. Prior to beginning tx, pt became tearful and stated that the tx is much different from what she had expected. Pt stated "I really don't like this. It's really uncomfortable." Staff reassured pt that the majority of [facility] patients acclimate to the sensation of the stimulation within the first 2 weeks of treatment. Pt stated that she plans to stick with the tx despite the discomfort. Pt tolerated tx well. %MT titrated up to 115%. Will attempt to titrate up to 120%MT at the following tx session. Pt with no complaints post-tx. Pt departed from clinic without issue.

## 2014-09-13 NOTE — Addendum Note (Signed)
Addended by: Charlcie Cradle on: 09/13/2014 08:18 PM   Modules accepted: Level of Service

## 2014-09-13 NOTE — Progress Notes (Signed)
Patient ID: [name], female   DOB: [birth year], 49 y.o.   MRN: [id number] Pt reported to [facility] for cortical mapping for Repetitive Transcranial Magnetic Stimulation treatment for Major Depressive Disorder. Pt also received her first session of tx. Pt completed a PHQ-9 prior to procedure with a score of 20 (severe depression). Pt also completed a Beck's Depression Inventory with a score of 35 (severe depression). Pt read and signed an Informed Consent Agreement for [place] tx. TMS tx system head support was fitted to pt's proportions with the following parameter: Up/Down -- 0.7 cm, Front/Back -- 0 cm, LC Setting -- 1.0 cm. After location was identified pt's first treatment session began. Tx was initiated at 80%MT, and was titrated up to 100%MT by the end of the session. After receiving stimulation for several minutes, pt complained of intolerable discomfort during stimulation. Tech increased coil angle to +15, after which the pt stated the stimulation was much more tolerable. Pt with no complaints post-tx. Pt departed from clinic without issue.   MD provider note: Tx coil was placed per biological markers including midline of pt's head and left tragus. Singular pulses were applied with incrementally increasing strength until a motor response was elicited in the musculature of the pt's right hand. This occurred at 1.3 SMT. Anterior/posterior hunt was conducted to find the area that elicited the strongest response, which was found at 4.5 cm A/P. Hunt was also conducted along the superior oblique angle to isolate a motor response in pt's right thumb. Pt's right thumb was unable to be isolated, and best response occurred at 38 degrees SOA, which included pt's index and middle fingers. MT Assist algorithm was used to calculate pt's precise motor threshold, which was found at 1.21 SMT. Cortical mapping procedure produced the following tx parameters for the pt: A/P -- 10.0 cm,  SOA -- 38 degrees, Coil Angle -- +5 degrees. Tx coil was shifted to the tx location and pt received her first burst of pulses. Pt grimaced upon stimulation and later verbalized that the pulses were very painful. Coil angle was increased to +10 degrees for pt comfort. Upon receiving her second burst of pulses, pt did not grimace as much and stated that the stimulation was tolerable. Having found pt's tx parameters and adjusted the coil for comfort, pt will receive high frequency repetitive Transcranial Magnetic Stimulation at 3000 pulses per session at 120% motor threshold consisting of 4 seconds of stimulation at 10 pulses per second separated by 26 seconds of rest.

## 2014-09-16 ENCOUNTER — Other Ambulatory Visit (INDEPENDENT_AMBULATORY_CARE_PROVIDER_SITE_OTHER): Payer: BLUE CROSS/BLUE SHIELD | Admitting: *Deleted

## 2014-09-16 DIAGNOSIS — F332 Major depressive disorder, recurrent severe without psychotic features: Secondary | ICD-10-CM

## 2014-09-16 NOTE — Progress Notes (Signed)
Patient ID: BRYNNLEIGH MCELWEE, female   DOB: 22-Jul-1965, 49 y.o.   MRN: [id number] Pt reported to [facility] for Repetitive Transcranial Magnetic Stimulation treatment for Major Depressive Disorder. Pt reported no change in medication regimen, alcohol/substance use, caffeine consumption, sleep pattern, or metal implant status since previous tx. Pt reported that she cried for several hours on the evening of Friday 09/13/14 (date of her previous tx session). Pt stated that she had not been able to cry for quite some time, and it felt good to "get it all out." Re: the following day, pt stated "I feel like it was the first normal day I have had in 25 years." Pt stated she experienced increased energy and motivation, as well as decreased isolation. Pt reported that her husband and daughter noticed this, as well. Pt stated she also felt less depressed on Sunday. Pt denied experiencing any side effects from [place] after her previous tx. Pt tolerated tx well today. %MT was titrated up to 120%, where it will remain for the rest of the pt's course of tx. Pt with no complaints post-tx. Pt departed from clinic without issue.

## 2014-09-17 ENCOUNTER — Other Ambulatory Visit: Payer: Self-pay

## 2014-09-17 ENCOUNTER — Other Ambulatory Visit (INDEPENDENT_AMBULATORY_CARE_PROVIDER_SITE_OTHER): Payer: BLUE CROSS/BLUE SHIELD | Admitting: *Deleted

## 2014-09-17 VITALS — BP 139/74 | HR 73

## 2014-09-17 DIAGNOSIS — F332 Major depressive disorder, recurrent severe without psychotic features: Secondary | ICD-10-CM | POA: Diagnosis not present

## 2014-09-17 DIAGNOSIS — Z1231 Encounter for screening mammogram for malignant neoplasm of breast: Secondary | ICD-10-CM

## 2014-09-17 NOTE — Progress Notes (Signed)
Patient ID: [name], female   DOB: Oct 12, 1965, 49 y.o.   MRN: [id number] Pt reported to [facility] for Repetitive Transcranial Magnetic Stimulation treatment for Major Depressive Disorder. Pt reported no change in medication regimen, alcohol/substance use, caffeine consumption, sleep pattern, or metal implant status since previous tx. Pt reiterated to writer that the pulses are "very difficult" for her. Pt described the magnitude of difficulty as somewhere between childbirth and getting a tattoo. Writer assured pt that most people who have received [place] at this clinic have reported that unpleasant sensations during stimulation decrease within the first two weeks of initiating tx. Pt tolerated tx well. %MT remained at 120% for the duration of tx. Pt with no complaints post-tx. Pt departed from clinic without issue.

## 2014-09-18 ENCOUNTER — Other Ambulatory Visit (INDEPENDENT_AMBULATORY_CARE_PROVIDER_SITE_OTHER): Payer: BLUE CROSS/BLUE SHIELD | Admitting: *Deleted

## 2014-09-18 VITALS — BP 131/81 | HR 86

## 2014-09-18 DIAGNOSIS — F332 Major depressive disorder, recurrent severe without psychotic features: Secondary | ICD-10-CM | POA: Diagnosis not present

## 2014-09-18 NOTE — Progress Notes (Signed)
Patient ID: [name], female   DOB: [birth year], 49 y.o.   MRN: [id number] Pt reported to [facility] for Repetitive Transcranial Magnetic Stimulation treatment for Major Depressive Disorder. Pt reported no change in medication regimen, alcohol/substance use, caffeine consumption, or metal implant status since previous tx. Pt reported that she had an extremely difficult time getting out of bed this morning, and she almost called out of work as a result. Pt completed a PHQ-9 with a score of 5 (mild depression). This is a drastic decrease from her baseline score of 20 (severe depression) taken on 09/12/2014. Writer explained the PHQ-9 depression inventory to the pt and clarified that the inventory is asking about the past 2 weeks. Pt verbalized that she understood this, and she felt that her answers to the questions were honest. Pt stated "I don't know how to explain it. I've just been feeling better. I have always been the type of person that responds to treatments faster that others." Pt acknowledged that it is possible that she is merely having a good week. Pt tolerated tx well. %MT remained at 120% for the duration of tx. Pt talked for the duration of the tx session with little response from this writer, telling this Probation officer about her extended family and sharing many details about numerous individuals she is related to. There was no change in the rate, volume, or tone of the pt's speech compared to what is normal for her. It should also be noted that the pt exhibited slightly increased rate of gait today while being escorted to the tx area. Will inform MD of pt's presentation today, and will continue to monitor for changes. Pt with no complaints post-tx. Pt departed from clinic without issue.

## 2014-09-19 ENCOUNTER — Other Ambulatory Visit (INDEPENDENT_AMBULATORY_CARE_PROVIDER_SITE_OTHER): Payer: BLUE CROSS/BLUE SHIELD | Admitting: *Deleted

## 2014-09-19 VITALS — BP 132/87 | HR 83 | Ht 63.0 in | Wt 128.4 lb

## 2014-09-19 DIAGNOSIS — F332 Major depressive disorder, recurrent severe without psychotic features: Secondary | ICD-10-CM | POA: Diagnosis not present

## 2014-09-19 NOTE — Progress Notes (Signed)
Patient ID: [name], female   DOB: 01/23/1966, 49 y.o.   MRN: [id number] Pt reported to [facility] for Repetitive Transcranial Magnetic Stimulation treatment for Major Depressive Disorder. Pt reported no change in medication regimen, alcohol/substance use, caffeine consumption, sleep pattern, or metal implant status since previous tx. Pt remained talkative throughout tx session today, but engaged Probation officer in conversation more (see note dated 09/18/14). Will continue to monitor for changes in pt's presentation. Pt tolerated tx well. %MT remained at 120% for the duration of tx. Pt with no complaints post-tx. Pt departed from clinic without issue.

## 2014-09-20 ENCOUNTER — Other Ambulatory Visit (INDEPENDENT_AMBULATORY_CARE_PROVIDER_SITE_OTHER): Payer: BLUE CROSS/BLUE SHIELD | Admitting: *Deleted

## 2014-09-20 VITALS — BP 126/81 | HR 89

## 2014-09-20 DIAGNOSIS — F332 Major depressive disorder, recurrent severe without psychotic features: Secondary | ICD-10-CM

## 2014-09-20 NOTE — Progress Notes (Signed)
Patient ID: [name], female   DOB: [birth year], 49 y.o.   MRN: [id number] Pt reported to [facility] for Repetitive Transcranial Magnetic Stimulation treatment for Major Depressive Disorder. Tx attended by two [place] trainees. Pt reported no change in medication regimen, alcohol/substance use, caffeine consumption, sleep pattern, or metal implant status since previous tx. Pt tolerated tx well. Pt endorsed significant discomfort in the tx location during stimulation, so coil angle was increased 5 degrees to +20 degrees. After this intervention, pt stated that the pulses were much more tolerable. %MT remained at 120% for the duration of tx. Pt with no complaints post-tx. Pt departed from clinic without issue.

## 2014-09-23 ENCOUNTER — Other Ambulatory Visit (INDEPENDENT_AMBULATORY_CARE_PROVIDER_SITE_OTHER): Payer: BLUE CROSS/BLUE SHIELD | Admitting: *Deleted

## 2014-09-23 VITALS — BP 139/92 | HR 78

## 2014-09-23 DIAGNOSIS — F332 Major depressive disorder, recurrent severe without psychotic features: Secondary | ICD-10-CM

## 2014-09-23 NOTE — Progress Notes (Signed)
Patient ID: [name], female   DOB: Dec 04, 1965, 49 y.o.   MRN: [id number] Pt reported to [facility] for Repetitive Transcranial Magnetic Stimulation treatment for Major Depressive Disorder. Pt reported no change in medication regimen, alcohol/substance use, caffeine consumption, sleep pattern, or metal implant status since previous tx. Pt stated that her energy level remained high over the weekend. Pt reported that she painted a room in her house over the weekend, which she has not had the energy to do for years. Pt was talkative today, initiating and maintaining conversation with staff throughout entire tx session. Pt tolerated tx well. %MT remained at 120% for the duration of tx. Pt with no complaints post-tx. Pt stated that stimulation was much more tolerable today than it has been thusfar in her course of tx. Pt departed from clinic without issue.

## 2014-09-24 ENCOUNTER — Other Ambulatory Visit (INDEPENDENT_AMBULATORY_CARE_PROVIDER_SITE_OTHER): Payer: BLUE CROSS/BLUE SHIELD | Admitting: *Deleted

## 2014-09-24 VITALS — BP 135/77 | HR 75

## 2014-09-24 DIAGNOSIS — F332 Major depressive disorder, recurrent severe without psychotic features: Secondary | ICD-10-CM

## 2014-09-25 ENCOUNTER — Other Ambulatory Visit (INDEPENDENT_AMBULATORY_CARE_PROVIDER_SITE_OTHER): Payer: BLUE CROSS/BLUE SHIELD | Admitting: *Deleted

## 2014-09-25 VITALS — BP 136/85 | HR 75 | Ht 63.0 in | Wt 127.2 lb

## 2014-09-25 DIAGNOSIS — F332 Major depressive disorder, recurrent severe without psychotic features: Secondary | ICD-10-CM | POA: Diagnosis not present

## 2014-09-25 NOTE — Progress Notes (Signed)
Patient ID: [name], female   DOB: 18-Nov-1965, 49 y.o.   MRN: [id number] Pt reported to [facility] for Repetitive Transcranial Magnetic Stimulation treatment for Major Depressive Disorder. Pt reported no change in medication regimen, alcohol/substance use, caffeine consumption, sleep pattern, or metal implant status since previous tx. Pt reported that she had been feeling tired and less energetic today, whereas since beginning [place] she has felt an increased level of energy compared to her baseline. Pt tolerated tx well. %MT remained at 120% for the duration of tx. Pt with no complaints post-tx. Pt departed from clinic without issue.

## 2014-09-25 NOTE — Progress Notes (Signed)
Patient ID: [name], female   DOB: 11-09-1965, 49 y.o.   MRN: [id number] Pt reported to [facility] for Repetitive Transcranial Magnetic Stimulation treatment for Major Depressive Disorder. Pt reported no change in medication regimen, alcohol/substance use, caffeine consumption, sleep pattern, or metal implant status since previous tx. Pt completed a PHQ-9 with a score of 6 (mild depression), which is decreased from her baseline of 20, but slightly increased from her score of 5 the previous week. Pt reported that she has felt less energetic the past few days, whereas immediately after beginning [place] tx she felt a drastic increase in her energy level that lasted several days. Pt reported that she feels that substantial progress has been made in alleviating her depression since beginning [place], but she feels there is still room for further progress. This is reflective of the trend in her PHQ-9 score, as it has decreased by 70% since beginning [place], but the pt is not yet in remission. Further tx is warranted in order to obtain remission for this pt from her depression. Pt tolerated tx well. %MT remained at 120% for the duration of tx. Pt with no complaints post-tx. Pt departed from clinic without issue.

## 2014-09-26 ENCOUNTER — Other Ambulatory Visit (INDEPENDENT_AMBULATORY_CARE_PROVIDER_SITE_OTHER): Payer: BLUE CROSS/BLUE SHIELD | Admitting: *Deleted

## 2014-09-26 VITALS — BP 142/89 | HR 75

## 2014-09-26 DIAGNOSIS — F332 Major depressive disorder, recurrent severe without psychotic features: Secondary | ICD-10-CM | POA: Diagnosis not present

## 2014-09-26 NOTE — Progress Notes (Signed)
Patient ID: [name], female   DOB: 1965/11/30, 49 y.o.   MRN: [id number] Pt reported to [facility] for Repetitive Transcranial Magnetic Stimulation treatment for Major Depressive Disorder. Pt reported no change in medication regimen, alcohol/substance use, caffeine consumption, sleep pattern, or metal implant status since previous tx. Pt concerned about her blood pressure (see vitals record). Pt states "my blood pressure is never high, but it's been high the whole time I've been doing this treatment." Pt inquiring if her Vyvanse could be causing her blood pressure to be elevated "since I'm feeling better and don't really need it for the energy anymore." Writer instructed pt to discuss this matter with her prescribing psychiatrist. Pt said she would. Pt tolerated tx well. %MT remained at 120% for the duration of tx. Pt with no complaints post-tx. Pt departed from clinic without issue.

## 2014-09-27 ENCOUNTER — Other Ambulatory Visit (INDEPENDENT_AMBULATORY_CARE_PROVIDER_SITE_OTHER): Payer: BLUE CROSS/BLUE SHIELD | Admitting: *Deleted

## 2014-09-27 VITALS — BP 129/79 | HR 75

## 2014-09-27 DIAGNOSIS — F332 Major depressive disorder, recurrent severe without psychotic features: Secondary | ICD-10-CM | POA: Diagnosis not present

## 2014-09-27 NOTE — Progress Notes (Signed)
Patient ID: [name], female   DOB: Jun 24, 1965, 49 y.o.   MRN: [id number] Pt reported to [facility] for Repetitive Transcranial Magnetic Stimulation treatment for Major Depressive Disorder. Pt reported no change in medication regimen, alcohol/substance use, caffeine consumption, sleep pattern, or metal implant status since previous tx. Pt presented with less energy today. Pt stated she worked for 5 hours at her job today, which made her very tired. This is in contrast to the increase in energy level the pt experienced immediately after beginning [place] tx. Pt tolerated tx well. %MT remained at 120% for the duration of tx. Pt with no complaints post-tx. Pt departed from clinic without issue.  I agree with assessment and plan [name] A. Sabra Heck, M.D.

## 2014-09-30 ENCOUNTER — Other Ambulatory Visit (INDEPENDENT_AMBULATORY_CARE_PROVIDER_SITE_OTHER): Payer: BLUE CROSS/BLUE SHIELD | Admitting: *Deleted

## 2014-09-30 VITALS — BP 132/89 | HR 81

## 2014-09-30 DIAGNOSIS — F332 Major depressive disorder, recurrent severe without psychotic features: Secondary | ICD-10-CM

## 2014-09-30 NOTE — Progress Notes (Signed)
Patient ID: [name], female   DOB: [birth year], 50 y.o.   MRN: [id number] Pt reported to [facility] for Repetitive Transcranial Magnetic Stimulation treatment for Major Depressive Disorder. Pt reported no change in medication regimen, alcohol/substance use, caffeine consumption, sleep pattern, or metal implant status since previous tx. Pt reported that one night over the weekend, she stayed up until 11:00pm watching a movie with her daughter, which she states she never does due to her depression. Pt stated "It's like I'm a different person." Pt reported that her two sons and her husband have also noticed and commented on the pt's progress since beginning [place] tx. Pt tolerated tx well. %MT remained at 120% for the duration of tx. Approximately halfway through tx session, pt verbalized that she could feel the pulses in her right hand. Tx was paused, coil removed from pt's head, proper positioning of pt's head was re-established, and tx was resumed. Pt stated that this resolved the issue. Pt with no complaints post-tx. Pt departed from clinic without issue.  I agree with assessment and plan [name], M.D.

## 2014-10-01 ENCOUNTER — Other Ambulatory Visit (INDEPENDENT_AMBULATORY_CARE_PROVIDER_SITE_OTHER): Payer: BLUE CROSS/BLUE SHIELD | Admitting: *Deleted

## 2014-10-01 VITALS — BP 130/78 | HR 81

## 2014-10-01 DIAGNOSIS — F332 Major depressive disorder, recurrent severe without psychotic features: Secondary | ICD-10-CM | POA: Diagnosis not present

## 2014-10-01 NOTE — Progress Notes (Signed)
Patient ID: [name], female   DOB: June 30, 1965, 49 y.o.   MRN: [id number] Pt reported to [facility] for Repetitive Transcranial Magnetic Stimulation treatment for Major Depressive Disorder. Pt reported no change in medication regimen, alcohol/substance use, caffeine consumption, sleep pattern, or metal implant status since previous tx. Pt completed a PHQ-9 with a score of 5 (mild depression), which is no change from the previous week. Pt also completed a Beck's Depression Inventory with a score of 17 (borderline clinical depression), which is a significant decrease from her baseline score of 35 taken on 09/12/14. Pt has clearly made significant gains since starting [place], but as she has not yet reached remission, further treatment is warranted. Pt tolerated tx well. %MT remained at 120% for the duration of tx. Pt with no complaints post-tx. Pt departed from clinic without issue.  I agree with assessment and plan [name], M.D.

## 2014-10-02 ENCOUNTER — Other Ambulatory Visit (INDEPENDENT_AMBULATORY_CARE_PROVIDER_SITE_OTHER): Payer: BLUE CROSS/BLUE SHIELD | Admitting: *Deleted

## 2014-10-02 VITALS — BP 144/88 | HR 78 | Ht 63.0 in | Wt 129.2 lb

## 2014-10-02 DIAGNOSIS — F332 Major depressive disorder, recurrent severe without psychotic features: Secondary | ICD-10-CM

## 2014-10-02 NOTE — Progress Notes (Signed)
Patient ID: [name], female   DOB: May 16, 1965, 49 y.o.   MRN: [id number] Pt reported to [facility] for Repetitive Transcranial Magnetic Stimulation treatment for Major Depressive Disorder. Pt reported no change in medication regimen, alcohol/substance use, caffeine consumption, sleep pattern, or metal implant status since previous tx. Pt presented today highly talkative. Pt also brought a gift for this Probation officer. Writer explained that he could not accept, as it would be against clinic policy and a breach of the provider/patient relationship. Pt verbalized that she understood. Pt tolerated tx well. %MT remained at 120% for the duration of tx. Pt with no complaints post-tx. Pt departed from clinic without issue.  I agree with assessment and plan [name] A. Sabra Heck, M.D.

## 2014-10-03 ENCOUNTER — Other Ambulatory Visit (INDEPENDENT_AMBULATORY_CARE_PROVIDER_SITE_OTHER): Payer: BLUE CROSS/BLUE SHIELD | Admitting: *Deleted

## 2014-10-03 VITALS — BP 136/78 | HR 75

## 2014-10-03 DIAGNOSIS — F332 Major depressive disorder, recurrent severe without psychotic features: Secondary | ICD-10-CM | POA: Diagnosis not present

## 2014-10-03 NOTE — Progress Notes (Addendum)
Patient ID: [name], female   DOB: 08-05-1965, 49 y.o.   MRN: [id number] Pt reported to [facility] for Repetitive Transcranial Magnetic Stimulation treatment for Major Depressive Disorder. Pt reported no change in medication regimen, alcohol/substance use, caffeine consumption, sleep pattern, or metal implant status since previous tx. Pt stating that she may be interested in coming off of some of her antidepressant regimen after [place], as she is feeling better and may not need the medication any longer. Pt stated she has an appointment with her prescribing psychiatrist next week. Writer encouraged pt to discuss this issue with her psychiatrist at her upcoming appointment. Pt tolerated tx well. %MT remained at 120% for the duration of tx. Pt with no complaints post-tx. Pt departed from clinic without issue.  I agree with assessment and plan [name] A. Sabra Heck, M.D.

## 2014-10-04 ENCOUNTER — Other Ambulatory Visit (HOSPITAL_COMMUNITY): Payer: BLUE CROSS/BLUE SHIELD | Attending: Psychiatry | Admitting: Psychiatry

## 2014-10-04 VITALS — BP 139/75 | HR 82

## 2014-10-04 DIAGNOSIS — F332 Major depressive disorder, recurrent severe without psychotic features: Secondary | ICD-10-CM | POA: Insufficient documentation

## 2014-10-04 NOTE — Progress Notes (Signed)
Patient ID: [name], female   DOB: 01-Mar-1966, 49 y.o.   MRN: [id number] Saw pt in clinic setting. Pt described that her CBK level is elevated [phone] per her PCP. Pt's PCP suggested discontinuing all psychotropic medication immediately. Pt stated that she has requested a call from her prescribing psychiatrist, and she will see him on 7/5 at 2:30 prior to her next [place] appointment. I recommend that we discontinue TMS until she is seen by her prescribing psychiatrist. Pt instructed to contact PCP or report to ED in the event she experiences increase of symptoms, such as muscle tightening or spasms.  Carlton Adam, M.D.

## 2014-10-07 ENCOUNTER — Other Ambulatory Visit (HOSPITAL_COMMUNITY): Payer: BLUE CROSS/BLUE SHIELD

## 2014-10-08 ENCOUNTER — Other Ambulatory Visit (INDEPENDENT_AMBULATORY_CARE_PROVIDER_SITE_OTHER): Payer: BLUE CROSS/BLUE SHIELD | Admitting: *Deleted

## 2014-10-08 VITALS — BP 144/86 | HR 86

## 2014-10-08 DIAGNOSIS — F332 Major depressive disorder, recurrent severe without psychotic features: Secondary | ICD-10-CM | POA: Diagnosis not present

## 2014-10-08 NOTE — Progress Notes (Signed)
Patient ID: [name], female   DOB: Dec 16, 1965, 49 y.o.   MRN: [id number] Pt reported to [facility] for Repetitive Transcranial Magnetic Stimulation treatment for Major Depressive Disorder. Pt reported that she was seen by her prescribing psychiatrist immediately prior to reporting to this clinic for [place] tx. Pt reported that her prescribing psychiatrist recommended continuing [place] treatments, despite her elevated creatine kinase level (see note from 10/04/14). Pt reported that her prescribing psychiatrist discontinued her Rexulti 1.5 mg due to her elevated creatine kinase level, and he wants her to have blood work again in two weeks to monitor this level. Pt reported that she stopped the [place] on Saturday, and she has been feeling "edgy and irritable" since then, including snapping at her children. Pt presented today with lethargic, somewhat guarded affect, and became tearful for a short time while discussing her fear of relapsing into a sever state of depression due to coming off the [place]. Pt reported no change in alcohol/substance use, caffeine consumption, sleep pattern, or metal implant status since previous tx. Pt tolerated tx well. %MT remained at 120% for the duration of tx. Pt with no complaints post-tx. Pt departed from clinic without issue.  I agree with assessment and plan [name] A. Sabra Heck, M.D.

## 2014-10-09 ENCOUNTER — Other Ambulatory Visit (INDEPENDENT_AMBULATORY_CARE_PROVIDER_SITE_OTHER): Payer: BLUE CROSS/BLUE SHIELD | Admitting: *Deleted

## 2014-10-09 VITALS — BP 116/73 | HR 75

## 2014-10-09 DIAGNOSIS — F332 Major depressive disorder, recurrent severe without psychotic features: Secondary | ICD-10-CM | POA: Diagnosis not present

## 2014-10-09 NOTE — Progress Notes (Signed)
Patient ID: [name], female   DOB: 03-Jul-1965, 49 y.o.   MRN: [id number] Pt reported to [facility] for Repetitive Transcranial Magnetic Stimulation treatment for Major Depressive Disorder. Pt reported no change in medication regimen, alcohol/substance use, caffeine consumption, sleep pattern, or metal implant status since previous tx. Pt presented with flat affect today, and stated that she felt "very melancholy," although she reported the irritability/edginess from the previous day had subsided. Pt tolerated tx well. %MT remained at 120% for the duration of tx. Pt with no complaints post-tx. Pt departed from clinic without issue.  I agree with assessment and plan [name], M.D.

## 2014-10-10 ENCOUNTER — Other Ambulatory Visit (INDEPENDENT_AMBULATORY_CARE_PROVIDER_SITE_OTHER): Payer: BLUE CROSS/BLUE SHIELD | Admitting: *Deleted

## 2014-10-10 VITALS — BP 120/71 | HR 75

## 2014-10-10 DIAGNOSIS — F332 Major depressive disorder, recurrent severe without psychotic features: Secondary | ICD-10-CM

## 2014-10-10 NOTE — Progress Notes (Signed)
Patient ID: [name], female   DOB: 1966/03/04, 49 y.o.   MRN: [id number]  Pt reported to [place] [facility] forTranscranial Magnetic Stimulation for Major Depressive Disorder. Pt reported no change in metal implant status, sleep pattern, appetite/intake, caffeine consumption or alcohol/substance use. Pt presented talkative and with bright affect today. Pt tolerated treatment. %MT remained at 120% for duration of treatment. Pt with no complaints post treatment. Pt departed from clinic without issue.  I agree with assessment and plan [name] A. Sabra Heck, M.D.

## 2014-10-11 ENCOUNTER — Other Ambulatory Visit (INDEPENDENT_AMBULATORY_CARE_PROVIDER_SITE_OTHER): Payer: BLUE CROSS/BLUE SHIELD | Admitting: *Deleted

## 2014-10-11 VITALS — BP 127/79 | HR 84

## 2014-10-11 DIAGNOSIS — F332 Major depressive disorder, recurrent severe without psychotic features: Secondary | ICD-10-CM

## 2014-10-11 NOTE — Progress Notes (Addendum)
Patient ID: [name], female   DOB: Jul 31, 1965, 49 y.o.   MRN: [id number] Pt reported to [facility] for Repetitive Transcranial Magnetic Stimulation treatment for Major Depressive Disorder. Pt accompanied for tx by her sone. Pt reported no change in medication regimen, alcohol/substance use, caffeine consumption, sleep pattern, or metal implant status since previous tx. Pt presented with flat affect today. Pt reported that she is feeling upset today due to marital conflict with her husband that began last night. Pt became tearful and stated "it's bad. I really messed up." Pt expressed that she is worried that she may experience situational depression as a result of these circumstances which might counteract the improvements she's made with [place]. Writer encouraged pt to try to deal with her depressive symptoms "one day at a time," rather than worrying about what they will be like in the future. Pt agreed that this is best. Pt tolerated tx well. %MT remained at 120% for the duration of tx. Pt with no complaints post-tx. Pt and her son departed from clinic without issue.  I agree with assessment and plan [name] A. Sabra Heck, M.D.

## 2014-10-14 ENCOUNTER — Other Ambulatory Visit (INDEPENDENT_AMBULATORY_CARE_PROVIDER_SITE_OTHER): Payer: BLUE CROSS/BLUE SHIELD | Admitting: *Deleted

## 2014-10-14 VITALS — BP 136/80 | HR 76

## 2014-10-14 DIAGNOSIS — F332 Major depressive disorder, recurrent severe without psychotic features: Secondary | ICD-10-CM

## 2014-10-14 NOTE — Progress Notes (Signed)
Patient ID: [name], female   DOB: 12-27-1965, 49 y.o.   MRN: [id number] Pt reported to [facility] for Repetitive Transcranial Magnetic Stimulation treatment for Major Depressive Disorder. Pt accompanied by her daughter for tx. Pt reported no change in medication regimen, alcohol/substance use, caffeine consumption, sleep pattern, or metal implant status since previous tx. Pt reported "I had an awful weekend. I came very close to admitting myself here at [facility]." Pt stated that she was experiencing SI due to the circumstantial stress/marital conflict between she and her husband (see note dated 10/11/14). Pt tolerated tx well. %MT remained at 120% for the duration of tx. Pt with no complaints post-tx. Prior to pt departing, Probation officer asked pt's daughter to wait in clinic lobby, and Probation officer discussed SI with pt. Pt denied current active SI, stating "I just don't think I have the guts to do it. I have to think of my kids." Pt denied a plan or intent to harm herself. Writer instructed pt to call 911, report to the nearest ED, or come to [facility] for assessment if her SI increases. Pt confirmed that she would. Pt entered into verbal agreement with writer that she would not harm herself before her next tx session, and that she would seek help per the aforementioned plan if her SI, which is currently passive, worsened. Pt and her daughter departed from clinic without issue. I agree with assessment and plan [name] A. Sabra Heck, M.D.

## 2014-10-15 ENCOUNTER — Other Ambulatory Visit (INDEPENDENT_AMBULATORY_CARE_PROVIDER_SITE_OTHER): Payer: BLUE CROSS/BLUE SHIELD | Admitting: *Deleted

## 2014-10-15 VITALS — BP 124/72 | HR 77

## 2014-10-15 DIAGNOSIS — F332 Major depressive disorder, recurrent severe without psychotic features: Secondary | ICD-10-CM | POA: Diagnosis not present

## 2014-10-15 NOTE — Progress Notes (Signed)
Patient ID: [name], female   DOB: 26-Jan-1966, 49 y.o.   MRN: [id number] Pt reported to [facility] for Repetitive Transcranial Magnetic Stimulation treatment for Major Depressive Disorder. Pt reported no change in medication regimen, alcohol/substance use, caffeine consumption, sleep pattern, or metal implant status since previous tx. Pt reported that she continues to experience anxiety as a result of the marital conflict/life stress she has been experiencing, but she has not been experiencing SI today (see previous notes from 10/11/14 and 10/14/14). Pt reported that she saw her prescribing psychiatrist today, but no changes were made to her medication regimen. Pt also reported that she had her blood drawn for lab work today to follow up on her elevate creatine kinase level discovered on 10/04/14 (see note from that date), and she should know the results tomorrow. Pt tolerated tx well. %MT remained at 120% for the duration of tx. Pt with no complaints post-tx. Pt departed from clinic without issue.  I agree with assessment and plan [name] A. Sabra Heck, M.D.

## 2014-10-16 ENCOUNTER — Other Ambulatory Visit (INDEPENDENT_AMBULATORY_CARE_PROVIDER_SITE_OTHER): Payer: BLUE CROSS/BLUE SHIELD | Admitting: Emergency Medicine

## 2014-10-16 VITALS — BP 126/83 | HR 73

## 2014-10-16 DIAGNOSIS — F332 Major depressive disorder, recurrent severe without psychotic features: Secondary | ICD-10-CM | POA: Diagnosis not present

## 2014-10-16 NOTE — Progress Notes (Signed)
Patient ID: [name], female   DOB: 09-07-1965, 49 y.o.   MRN: [id number] Pt reported to [facility] for Transcranial Magnetic Stimulation for Major Depressive Disorder. Pt reported no change in medication regimen, sleep pattern, caffeine intake, appetite/intake, metal implant status, or alcohol/substance abuse since previous treatment.Pt stated she was felling less depressed today compared to earlier in the week. Pt has been dealing with life stressors (martial conflict). Pt endorsed mild nauseas which she stated she has been experiencing all week. Pt also stated that she has not received the results from the blood that was drawn (see not from 10/15/14).  Pt tolerated treatment well. Pt was talkative as usual and maintained conversation with staff members. %MT remained at 120% for the duration of treatment. Pt had no complaints post treatment. Pt departed clinic without issue.   I agree with assessment and plan [name] A. Sabra Heck, M.D.

## 2014-10-17 ENCOUNTER — Other Ambulatory Visit (INDEPENDENT_AMBULATORY_CARE_PROVIDER_SITE_OTHER): Payer: BLUE CROSS/BLUE SHIELD | Admitting: *Deleted

## 2014-10-17 ENCOUNTER — Ambulatory Visit
Admission: RE | Admit: 2014-10-17 | Discharge: 2014-10-17 | Disposition: A | Discharge status: Home / Self Care | Payer: Self-pay | Source: Ambulatory Visit

## 2014-10-17 VITALS — BP 124/77 | HR 73

## 2014-10-17 DIAGNOSIS — Z1231 Encounter for screening mammogram for malignant neoplasm of breast: Secondary | ICD-10-CM

## 2014-10-17 DIAGNOSIS — F332 Major depressive disorder, recurrent severe without psychotic features: Secondary | ICD-10-CM | POA: Diagnosis not present

## 2014-10-17 NOTE — Progress Notes (Signed)
Patient ID: NABA SNEED, female   DOB: 10-Nov-1965, 49 y.o.   MRN: [id number] Pt reported to [facility] for Repetitive Transcranial Magnetic Stimulation treatment for Major Depressive Disorder. Pt accompanied for tx by her daughter. Pt reported no change in medication regimen, alcohol/substance use, caffeine consumption, sleep pattern, or metal implant status since previous tx. Pt stated that she heard from her PCP's office today re: her CBK level which was extremely elevated on 10/04/14 (see note dated the same). Pt stated that a receptionist informed her that her CBK level from the blood drawn on 10/16/14 was in the normal range. Pt with bright affect today and persistent interaction with staff and her daughter. Pt completed a PHQ-9 with a score of 6 (mild depression). Pt tolerated tx well. %MT remained at 120% for the duration of tx. Pt with no complaints post-tx. Pt and her daughter departed from clinic without issue.  I agree with assessment and plan [name] A. Sabra Heck, M.D.

## 2014-10-18 ENCOUNTER — Other Ambulatory Visit (INDEPENDENT_AMBULATORY_CARE_PROVIDER_SITE_OTHER): Payer: BLUE CROSS/BLUE SHIELD | Admitting: *Deleted

## 2014-10-18 VITALS — BP 116/71 | HR 83

## 2014-10-18 DIAGNOSIS — F332 Major depressive disorder, recurrent severe without psychotic features: Secondary | ICD-10-CM

## 2014-10-18 NOTE — Progress Notes (Signed)
Patient ID: [name], female   DOB: 06/28/1965, 49 y.o.   MRN: [id number] Pt reported to [facility] for Repetitive Transcranial Magnetic Stimulation treatment for Major Depressive Disorder. Pt reported no change in medication regimen, alcohol/substance use, caffeine consumption, sleep pattern, or metal implant status since previous tx. Pt presented with somewhat flat affect today, but maintained conversation with Probation officer for the duration of the session. Regarding her score on the PHQ-9 at the previous session, pt stated that she feels that she is feeling less depressed despite the life stress she is currently dealing with, although she stated she is feeling a considerable amount of anxiety. Pt tolerated tx well. %MT remained at 120% for the duration of tx. Pt with no complaints post-tx. Pt departed from clinic without issue.  I agree with assessment and plan [name] A. Sabra Heck, M.D.

## 2014-10-21 ENCOUNTER — Other Ambulatory Visit (INDEPENDENT_AMBULATORY_CARE_PROVIDER_SITE_OTHER): Payer: BLUE CROSS/BLUE SHIELD | Admitting: *Deleted

## 2014-10-21 ENCOUNTER — Other Ambulatory Visit: Payer: Self-pay | Admitting: Obstetrics and Gynecology

## 2014-10-21 VITALS — BP 143/90 | HR 82

## 2014-10-21 DIAGNOSIS — F332 Major depressive disorder, recurrent severe without psychotic features: Secondary | ICD-10-CM

## 2014-10-21 DIAGNOSIS — R928 Other abnormal and inconclusive findings on diagnostic imaging of breast: Secondary | ICD-10-CM

## 2014-10-21 NOTE — Progress Notes (Signed)
Patient ID: [name], female   DOB: [birth year], 49 y.o.   MRN: [id number]  Pt reported to [facility] forTranscranial Magnetic Stimulation for Major Depressive Disorder. Pt reported no change in metal implant status, caffeine consumption or alcohol/substance use. Pt did report change in sleep pattern and appetite/intake. Pt stated that she could not sleep last night and that she barely ate all week. After reporting this, pt became tearful and very anxious. Pt also reported that she stopped taking her one of her medications last week. [name] (nurse) was notified and Dr. Sabra [name] was called. Dr Sabra [name] suggested that pt continue with treatment this afternoon. Pt tolerated treatment. %MT remained at 120% for duration of treatment. Pt with no complaints post treatment. Pt departed from clinic without issue.  I agree with assessment and plan [name], M.D.

## 2014-10-22 ENCOUNTER — Other Ambulatory Visit (INDEPENDENT_AMBULATORY_CARE_PROVIDER_SITE_OTHER): Payer: BLUE CROSS/BLUE SHIELD | Admitting: Emergency Medicine

## 2014-10-22 VITALS — BP 140/86 | HR 89

## 2014-10-22 DIAGNOSIS — F332 Major depressive disorder, recurrent severe without psychotic features: Secondary | ICD-10-CM

## 2014-10-22 NOTE — Progress Notes (Signed)
Patient ID: [name], female   DOB: 10-20-1965, 49 y.o.   MRN: [id number]  Pt reported to [facility] for Transcranial Magnetic Stimulation for Major Depressive Disorder. Pt reported that her medication regimen has changed - pt did not take one of her medications. Hilliard Clark was notified by tech.  PT reported no change in her sleep pattern, caffeine intake, metal implant status, or alcohol/substance abuse. However, pt reported that her appetite has been suppressed due to medication. Pt stated she only has eaten a piece of toast and cheese. Pt stated she has been feeling very anxious lately and reported that yesterdays session was extremely effective. Pt was accompanied by two of her girlfriends. Pt was talkative like normal and expressed her life stressors. In addition, pt expressed concerns regarding her medications, leftover [place] sessions and her doctors choice of medications. Pt's girlfriends encouraged the idea of switching to a different doctor - pt strongly took idea into consideration. %MT remained at 120% for the duration of treatment. Pt had no complaints post treatment. Pt and her two girlfriends departed clinic without issue.    Marland Kitchen

## 2014-10-23 ENCOUNTER — Other Ambulatory Visit (INDEPENDENT_AMBULATORY_CARE_PROVIDER_SITE_OTHER): Payer: BLUE CROSS/BLUE SHIELD | Admitting: Emergency Medicine

## 2014-10-23 VITALS — BP 122/70 | HR 78

## 2014-10-23 DIAGNOSIS — F332 Major depressive disorder, recurrent severe without psychotic features: Secondary | ICD-10-CM

## 2014-10-23 NOTE — Progress Notes (Signed)
Patient ID: [name], female   DOB: 09/17/1965, 49 y.o.   MRN: [id number] Pt reported to [facility] for Transcranial Magnetic Stimulation for Major Depressive Disorder. Pt reported that her medication regimen has changed (see list of changes below). Hilliard Clark was notified by tech. PT reported no change in her sleep pattern, caffeine intake, metal implant status, or alcohol/substance abuse. However, pt reported that her appetite has been suppressed due to medication. Pt stated she has only eaten a piece of toast. Pt was accompanied by one of her girlfriends. Pt was talkative like normal and expressed her life stressors (martial issues and anxiety about going on family trip this weekend). Pt's girlfriend was supportive during the treatment, however, repeatedly talked about pt's martial problems. Pt did not display any discomfort when her girlfriend expressed her feelings. Pt repeatedly mentioned how stress she was going on the family trip. Pt mentioned she was not going to come to her session 30, however, was strongly encouraged by her girlfriend and tech that needs to come in. Tech and pt made arrangements for early Friday morning. Pt briefly mentioned she received her mammogram results and was requested to see doctor for further updates. %MT remained at 120% for the duration of treatment. Pt had no complaints post treatment. Pt and her two girlfriends departed clinic without issue.   Medication changes instructed by pt's doctor:  *Vyvanse (normally takes 40 mg) - pt is completely off this medication starting this morning 10/23/14 *Vibride (normally takes 20 mg, switched to taking 40 mg) - pt started this morning 10/23/14 *Alprazolam (normally takes 3 or 2 at night, switched to taking 1 at night) - doctor advised it was based on pt discretion. Doctor told pt she will be taken off this medication when she returns from her trip *Quetiapine (started at 1/2 a dose - 50 mg - pt  cuts pill in half) - pt started last night 10/22/14

## 2014-10-24 ENCOUNTER — Ambulatory Visit
Admission: RE | Admit: 2014-10-24 | Discharge: 2014-10-24 | Disposition: A | Discharge status: Home / Self Care | Payer: BLUE CROSS/BLUE SHIELD | Source: Ambulatory Visit | Attending: Obstetrics and Gynecology | Admitting: Obstetrics and Gynecology

## 2014-10-24 ENCOUNTER — Other Ambulatory Visit (INDEPENDENT_AMBULATORY_CARE_PROVIDER_SITE_OTHER): Payer: BLUE CROSS/BLUE SHIELD | Admitting: Emergency Medicine

## 2014-10-24 VITALS — BP 115/76 | HR 65 | Wt 122.4 lb

## 2014-10-24 DIAGNOSIS — F332 Major depressive disorder, recurrent severe without psychotic features: Secondary | ICD-10-CM

## 2014-10-24 DIAGNOSIS — R928 Other abnormal and inconclusive findings on diagnostic imaging of breast: Secondary | ICD-10-CM

## 2014-10-24 NOTE — Progress Notes (Signed)
Patient ID: [name], female   DOB: 06-Mar-1966, 49 y.o.   MRN: [id number] Pt reported to [facility] for Transcranial Magnetic Stimulation for Major Depressive Disorder. Pt reported the changes in her medication regimen yesterday. Changes have been reported to [place]. Modifications have been made. Pt reported no changes in sleep pattern, caffeine intake, appetite/intake, metal implant status, or alcohol/substance abuse since previous treatment. Pt reported eating half a piece of toast and ice cream cone from [employer]. Pt reported her results from her mammogram visit - a benign cyst was located on left breast. Pt tolerated treatment well. Pt was talkative as usual and maintained conversation with staff member. Pt was accompanied by her son. %MT remained at 120% for the duration of treatment. Pt had no complaints post treatment. Pt and her son departed clinic without issue.

## 2014-10-25 ENCOUNTER — Other Ambulatory Visit (INDEPENDENT_AMBULATORY_CARE_PROVIDER_SITE_OTHER): Payer: BLUE CROSS/BLUE SHIELD | Admitting: Emergency Medicine

## 2014-10-25 VITALS — BP 138/90 | HR 80

## 2014-10-25 DIAGNOSIS — F332 Major depressive disorder, recurrent severe without psychotic features: Secondary | ICD-10-CM

## 2014-10-25 NOTE — Progress Notes (Signed)
Patient ID: [name], female   DOB: Nov 03, 1965, 49 y.o.   MRN: [id number] Pt reported to [facility] for Transcranial Magnetic Stimulation for Major Depressive Disorder. Pt reported no changes in medication sleep pattern,metal implant status, or alcohol/substance abuse since previous treatment. Pt has made a smooth transition from her medication adjustment from earlier this week.  Pt reported consuming more food - nuts, cottage cheese and toast. Pt also started drinking coffee and stated that it does not upset her stomach any longer - no nausea experienced. Pt concludes that her slight increase of appetite may be due to the termination of Vyvanse medication. Pt explained she does not feel anxious about this trip - pt will be departing soon as she leaves session. Pt tolerated treatment well. Pt was talkative as usual and maintained conversation with staff member. Pt remained at 120% for the duration of treatment. Pt had no complaints post treatment. Pt departed clinic without issue.

## 2014-11-04 ENCOUNTER — Other Ambulatory Visit (HOSPITAL_COMMUNITY): Payer: BLUE CROSS/BLUE SHIELD | Attending: Psychiatry | Admitting: *Deleted

## 2014-11-04 VITALS — BP 140/75 | HR 63

## 2014-11-04 DIAGNOSIS — F332 Major depressive disorder, recurrent severe without psychotic features: Secondary | ICD-10-CM | POA: Diagnosis present

## 2014-11-04 NOTE — Progress Notes (Signed)
Patient ID: [name], female   DOB: 05-Jul-1965, 49 y.o.   MRN: [id number] Pt reported to [facility] for Repetitive Transcranial Magnetic Stimulation treatment for Major Depressive Disorder. Pt accompanied for tx by her husband. Pt with flat affect for duration of tx, at times appearing anxious. Pt reported no change in medication regimen, alcohol/substance use, caffeine consumption, sleep pattern, or metal implant status since previous tx. Pt tolerated tx well. %MT remained at 120% for the duration of tx. Pt with no complaints post-tx.  Pt and her husband departed from clinic without issue.  I agree with assessment and plan [name] A. Sabra Heck, M.D.

## 2014-11-06 ENCOUNTER — Other Ambulatory Visit (HOSPITAL_COMMUNITY): Payer: BLUE CROSS/BLUE SHIELD | Attending: Psychiatry | Admitting: *Deleted

## 2014-11-06 VITALS — BP 143/86 | HR 88 | Ht 63.0 in | Wt 120.0 lb

## 2014-11-06 DIAGNOSIS — F329 Major depressive disorder, single episode, unspecified: Secondary | ICD-10-CM | POA: Insufficient documentation

## 2014-11-06 DIAGNOSIS — F332 Major depressive disorder, recurrent severe without psychotic features: Secondary | ICD-10-CM

## 2014-11-06 NOTE — Progress Notes (Signed)
Patient ID: [name], female   DOB: 1966/03/16, 49 y.o.   MRN: [id number] Pt reported to [facility] for Repetitive Transcranial Magnetic Stimulation treatment for Major Depressive Disorder. Pt presented with flat, depressed affect. Pt reported no change in medication regimen, alcohol/substance use, caffeine consumption, sleep pattern, or metal implant status since previous tx. Pt completed a PHQ-9 with a score of 10 (moderate depression). This is a slight increase from her score of 9 on 11/01/14 (mild depression). Pt has been dealing with significant life stressors recently, including financial and marital stress, which she believes are affecting her results from treatment. Pt tolerated tx well. %MT remained at 120% for the duration of tx. Pt with no complaints post-tx. Pt departed from clinic without issue.

## 2014-11-08 ENCOUNTER — Other Ambulatory Visit (INDEPENDENT_AMBULATORY_CARE_PROVIDER_SITE_OTHER): Payer: BLUE CROSS/BLUE SHIELD | Admitting: Emergency Medicine

## 2014-11-08 VITALS — BP 135/91 | HR 80

## 2014-11-08 DIAGNOSIS — F332 Major depressive disorder, recurrent severe without psychotic features: Secondary | ICD-10-CM | POA: Diagnosis not present

## 2014-11-08 NOTE — Progress Notes (Signed)
Patient ID: [name], female   DOB: October 02, 1965, 49 y.o.   MRN: [id number] Pt reported to [facility] for Repetitive Transcranial Magnetic Stimulation treatment for Major Depressive Disorder.  Pt reported no changes in alcohol/substance use, caffeine consumption, sleep pattern, or metal implant status since previous tx. However, pt has made modifications to medication regimen - pt currently ONLY taking 40 mg Viibryd in the morning and 1 mg of Xanax (2 pills) at night. Pt was talkative through out session with staff venting about her stressors (i.e., kids starting school this week and marital stressor - mentioned going to see abuse counselor). Pt tolerated tx well. %MT remained at 120% for the duration of tx. Pt expressed no complaints post-tx. Pt departed from clinic without issue

## 2014-11-11 ENCOUNTER — Other Ambulatory Visit (INDEPENDENT_AMBULATORY_CARE_PROVIDER_SITE_OTHER): Payer: BLUE CROSS/BLUE SHIELD | Admitting: *Deleted

## 2014-11-11 VITALS — BP 126/74 | HR 82

## 2014-11-11 DIAGNOSIS — F332 Major depressive disorder, recurrent severe without psychotic features: Secondary | ICD-10-CM | POA: Diagnosis not present

## 2014-11-11 NOTE — Progress Notes (Signed)
Patient ID: [name], female   DOB: 05/17/1965, 49 y.o.   MRN: [id number] Pt reported to [facility] for Repetitive Transcranial Magnetic Stimulation treatment for Major Depressive Disorder. Pt presented with bright, calm affect. Pt reported no change in medication regimen, alcohol/substance use, caffeine consumption, sleep pattern, or metal implant status since previous tx. Pt stated that her mood improved drastically over the weekend. Pt also reported that she has been able to eat regular meals beginning over the weekend, as well, which she has been unable to do for the past two weeks due to reduced appetite from her anxiety. Pt completed a PHQ-9 with a score of 7 (mild depression). Pt tolerated tx well. %MT remained at 120% for the duration of tx. Pt with no complaints post-tx. Pt departed from clinic without issue. I agree with assessment and plan [name] A. Sabra Heck, M.D.

## 2014-11-12 ENCOUNTER — Telehealth (HOSPITAL_COMMUNITY): Payer: Self-pay | Admitting: *Deleted

## 2014-11-12 ENCOUNTER — Other Ambulatory Visit (HOSPITAL_COMMUNITY): Payer: BLUE CROSS/BLUE SHIELD

## 2014-11-12 NOTE — Telephone Encounter (Signed)
Opened in error

## 2014-11-14 ENCOUNTER — Other Ambulatory Visit (INDEPENDENT_AMBULATORY_CARE_PROVIDER_SITE_OTHER): Payer: BLUE CROSS/BLUE SHIELD | Admitting: *Deleted

## 2014-11-14 VITALS — BP 120/78 | HR 75

## 2014-11-14 DIAGNOSIS — F332 Major depressive disorder, recurrent severe without psychotic features: Secondary | ICD-10-CM

## 2014-11-14 NOTE — Progress Notes (Signed)
Patient ID: [name], female   DOB: [birth year], 49 y.o.   MRN: [id number] Pt reported to [facility] for Repetitive Transcranial Magnetic Stimulation treatment for Major Depressive Disorder. Pt reported no change in alcohol/substance use, caffeine consumption, sleep pattern, or metal implant status since previous tx. Pt reported that she recently saw her prescribing psychiatrist who altered her medication regimen. Pt stated she is currently only taking Vybriid 40 mg qd and Xanax 2 mg qd. Pt presented with bright affect today and maintained conversation with Probation officer for entire tx session. Pt stated she is feeling much better re: her depression. Pt tolerated tx well. %MT remained at 120% for the duration of tx. Pt with no complaints post-tx. Pt departed from clinic without issue.  I agree with assessment and plan [name] A. Sabra Heck, M.D.

## 2014-11-18 ENCOUNTER — Other Ambulatory Visit (INDEPENDENT_AMBULATORY_CARE_PROVIDER_SITE_OTHER): Payer: BLUE CROSS/BLUE SHIELD | Admitting: *Deleted

## 2014-11-18 VITALS — BP 138/82 | HR 76 | Ht 63.0 in | Wt 118.2 lb

## 2014-11-18 DIAGNOSIS — F332 Major depressive disorder, recurrent severe without psychotic features: Secondary | ICD-10-CM

## 2014-11-18 NOTE — Progress Notes (Signed)
Patient ID: [name], female   DOB: Sep 14, 1965, 49 y.o.   MRN: [id number] Pt reported to [facility] for Repetitive Transcranial Magnetic Stimulation treatment for Major Depressive Disorder. This is the final session in the pt's course of [place] tx. Pt presented with flat affect today and became tearful at several points during the session, stating that she was sad that today was her final day of tx. Pt reported no change in medication regimen, alcohol/substance use, caffeine consumption, sleep pattern, or metal implant status since previous tx. Pt completed a PHQ-9 with a score of 9 (mild depression). Pt also completed a Beck's Depression Inventory with a score of (borderline clinical depression). This constitutes a 55% decrease in the pt's PHQ-9 score compared to her baseline score of 20 on 09/12/14, which signifies that she has responded to [place] tx. Pt tolerated tx well. %MT remained at 120% for the duration of tx. Pt with no complaints post-tx. Pt departed from clinic without issue. Will schedule a follow up appointment for the pt to see the MD for approximately 1 month from today.  I agree with assessment and plan [name] A. Sabra Heck, M.D.

## 2014-12-19 ENCOUNTER — Ambulatory Visit (HOSPITAL_COMMUNITY): Payer: Self-pay | Admitting: Psychiatry

## 2014-12-24 ENCOUNTER — Ambulatory Visit (HOSPITAL_COMMUNITY): Payer: BLUE CROSS/BLUE SHIELD | Admitting: Psychiatry

## 2014-12-24 ENCOUNTER — Encounter (HOSPITAL_COMMUNITY): Payer: Self-pay | Admitting: Psychiatry

## 2014-12-24 NOTE — Progress Notes (Signed)
Patient ID: [name], female   DOB: [birth year], 49 y.o.   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  That set her back and she lost the response she had and feels about the same as when she started.  Does not see her outpatient psychiatrist until October but would like to see Dr Doyne Keel to see if something else can be done as she has not responded to any medication to any degree.  ECT is not possible at the moment because she has nobody to consistently go with her to treatments and payment might also be a problem as well.

## 2014-12-25 ENCOUNTER — Ambulatory Visit
Admission: RE | Admit: 2014-12-25 | Discharge: 2014-12-25 | Disposition: A | Discharge status: Home / Self Care | Payer: BLUE CROSS/BLUE SHIELD | Source: Ambulatory Visit | Attending: Family Medicine | Admitting: Family Medicine

## 2014-12-25 ENCOUNTER — Other Ambulatory Visit: Payer: Self-pay | Admitting: Family Medicine

## 2014-12-25 DIAGNOSIS — M545 Low back pain, unspecified: Secondary | ICD-10-CM

## 2014-12-26 ENCOUNTER — Encounter (HOSPITAL_COMMUNITY): Payer: Self-pay | Admitting: Emergency Medicine

## 2014-12-26 ENCOUNTER — Emergency Department (HOSPITAL_COMMUNITY)
Admission: EM | Admit: 2014-12-26 | Discharge: 2014-12-26 | Disposition: A | Discharge status: Home / Self Care | Payer: BLUE CROSS/BLUE SHIELD | Attending: Emergency Medicine | Admitting: Emergency Medicine

## 2014-12-26 ENCOUNTER — Emergency Department (HOSPITAL_COMMUNITY): Payer: BLUE CROSS/BLUE SHIELD

## 2014-12-26 ENCOUNTER — Ambulatory Visit (HOSPITAL_COMMUNITY): Payer: Self-pay | Admitting: Psychiatry

## 2014-12-26 DIAGNOSIS — Z872 Personal history of diseases of the skin and subcutaneous tissue: Secondary | ICD-10-CM | POA: Insufficient documentation

## 2014-12-26 DIAGNOSIS — Z8669 Personal history of other diseases of the nervous system and sense organs: Secondary | ICD-10-CM | POA: Insufficient documentation

## 2014-12-26 DIAGNOSIS — R5383 Other fatigue: Secondary | ICD-10-CM | POA: Diagnosis not present

## 2014-12-26 DIAGNOSIS — Z86018 Personal history of other benign neoplasm: Secondary | ICD-10-CM | POA: Diagnosis not present

## 2014-12-26 DIAGNOSIS — R531 Weakness: Secondary | ICD-10-CM | POA: Diagnosis present

## 2014-12-26 DIAGNOSIS — Z720 Tobacco use: Secondary | ICD-10-CM | POA: Insufficient documentation

## 2014-12-26 DIAGNOSIS — N200 Calculus of kidney: Secondary | ICD-10-CM

## 2014-12-26 DIAGNOSIS — Z79899 Other long term (current) drug therapy: Secondary | ICD-10-CM | POA: Insufficient documentation

## 2014-12-26 LAB — BASIC METABOLIC PANEL
Anion gap: 7 (ref 5–15)
BUN: 15 mg/dL (ref 6–20)
CHLORIDE: 103 mmol/L (ref 101–111)
CO2: 31 mmol/L (ref 22–32)
CREATININE: 0.91 mg/dL (ref 0.44–1.00)
Calcium: 9 mg/dL (ref 8.9–10.3)
GFR calc Af Amer: >60 mL/min (ref >60)
GFR calc non Af Amer: >60 mL/min (ref >60)
Glucose, Bld: 117 mg/dL — ABNORMAL HIGH (ref 65–99)
Potassium: 3.5 mmol/L (ref 3.5–5.1)
Sodium: 141 mmol/L (ref 135–145)

## 2014-12-26 LAB — CBC WITH DIFFERENTIAL/PLATELET
Basophils Absolute: 0 10*3/uL (ref 0.0–0.1)
Basophils Relative: 0 %
EOS PCT: 1 %
Eosinophils Absolute: 0.1 10*3/uL (ref 0.0–0.7)
HCT: 43.4 % (ref 36.0–46.0)
Hemoglobin: 14.9 g/dL (ref 12.0–15.0)
LYMPHS ABS: 1.6 10*3/uL (ref 0.7–4.0)
Lymphocytes Relative: 21 %
MCH: 32.5 pg (ref 26.0–34.0)
MCHC: 34.3 g/dL (ref 30.0–36.0)
MCV: 94.8 fL (ref 78.0–100.0)
MONOS PCT: 7 %
Monocytes Absolute: 0.5 10*3/uL (ref 0.1–1.0)
Neutro Abs: 5.3 10*3/uL (ref 1.7–7.7)
Neutrophils Relative %: 71 %
PLATELETS: 182 10*3/uL (ref 150–400)
RBC: 4.58 MIL/uL (ref 3.87–5.11)
RDW: 12.5 % (ref 11.5–15.5)
WBC: 7.5 10*3/uL (ref 4.0–10.5)

## 2014-12-26 LAB — URINALYSIS, ROUTINE W REFLEX MICROSCOPIC
Bilirubin Urine: NEGATIVE
GLUCOSE, UA: NEGATIVE mg/dL
HGB URINE DIPSTICK: NEGATIVE
Ketones, ur: NEGATIVE mg/dL
Leukocytes, UA: NEGATIVE
Nitrite: NEGATIVE
PH: 7 (ref 5.0–8.0)
Protein, ur: NEGATIVE mg/dL
SPECIFIC GRAVITY, URINE: 1.005 (ref 1.005–1.030)
Urobilinogen, UA: 0.2 mg/dL (ref 0.0–1.0)

## 2014-12-26 MED ORDER — SODIUM CHLORIDE 0.9 % IV SOLN: Freq: Once | INTRAVENOUS | Status: DC

## 2014-12-26 MED ORDER — SODIUM CHLORIDE 0.9 % IV BOLUS (SEPSIS)
1000.0000 mL | Freq: Once | INTRAVENOUS | Status: AC
  Administered 2014-12-26: 1000 mL via INTRAVENOUS

## 2014-12-26 NOTE — Discharge Instructions (Signed)

## 2014-12-26 NOTE — ED Provider Notes (Signed)
CSN: [id number]     Arrival date & time 12/26/14  1329 History   First MD Initiated Contact with Patient 12/26/14 1519     Chief Complaint  Patient presents with  . Nephrolithiasis  . Nausea  . Weakness     (Consider location/radiation/quality/duration/timing/severity/associated sxs/prior Treatment) HPI   [name] is a 49 y.o. female  PCP: Suzan Garibaldi, FNP - Dr. Ernie Hew  Blood pressure 133/80, pulse 71, temperature 98.2 F (36.8 C), resp. rate 13, height 5\' 3"  (1.6 m), weight 113 lb (51.256 kg), last menstrual period 05/20/2010, SpO2 98 %.  SIGNIFICANT PMH: hx of abdominal, hx of serotonin syndrome, pelvic/abdominal pain CHIEF COMPLAINT: left flank/back pains with nausea  When: 1 week ago Mechanism: pt reports starting not to feel well, thought it was from working out. She saw Dr. Ernie Hew two days ago who was concerned with kidney stones and got an xray for evaluation, the stones are 6 mm and more smaller stones. Concern for obstruction and advised to come to the ED Chronicity: acute Location: left flank/back Radiation: towards middle of stomach  Quality and severity: stabbing lasting only a few seconds Treatments tried: Ketorolac, Flomax and Rocephin Alleviating factors: None Worsening factors: None Associated Symptoms: Significant fatigue, reports its her most significant symptoms  Risk Factors: none  Negative ROS: Confusion, diaphoresis, fever, headache, weakness (general or focal), change of vision,  neck pain, dysphagia, aphagia, chest pain, shortness of breath, nausea, vomiting, diarrhea, lower extremity swelling, rash.   Past Medical History  Diagnosis Date  . Galactorrhea   . Pelvic relaxation   . Anal itching   . Cystocele   . Rectocele   . Depression   . Decreased libido   . Breast nodule     left  . Pelvic pain in female   . Abdominal pain   . Breast inflammation   . Mastodynia   . Fibroadenoma   . Serotonin syndrome    Past Surgical  History  Procedure Laterality Date  . Abdominal hysterectomy      partial  . Ovarian cyst removal      left  . Hernia repair    . Incontinence surgery     Family History  Problem Relation Age of Onset  . Heart disease Mother     angioplasty  . Cancer Father     prostate  . Hypertension Father   . Depression Father   . Bipolar disorder Father   . Hypertension Maternal Grandmother   . Diabetes Maternal Grandmother     borderline  . Hypertension Paternal Grandmother   . Arthritis Paternal Grandmother   . Stroke Paternal Grandmother   . Rheum arthritis Paternal Grandfather   . Rheum arthritis Sister   . Rheum arthritis Brother    Social History  Substance Use Topics  . Smoking status: Current Every Day Smoker -- 0.50 packs/day    Types: Cigarettes  . Smokeless tobacco: Never Used  . Alcohol Use: 0.0 oz/week    0 Standard drinks or equivalent per week   OB History    Gravida Para Term Preterm AB TAB SAB Ectopic Multiple Living   3 3        3      Review of Systems 10 Systems reviewed and are negative for acute change except as noted in the HPI.   Allergies  Review of patient's allergies indicates no known allergies.  Home Medications   Prior to Admission medications   Medication Sig Start Date End Date Taking?  Authorizing Provider  ALPRAZolam Duanne Moron) 1 MG tablet Take 2 mg by mouth at bedtime as needed for anxiety.    Yes Historical Provider, MD  ketorolac (TORADOL) 10 MG tablet Take 10 mg by mouth every 6 (six) hours as needed for moderate pain.  12/23/14  Yes Historical Provider, MD  tamsulosin (FLOMAX) 0.4 MG CAPS capsule Take 0.4 mg by mouth daily. 12/23/14  Yes Historical Provider, MD  VIIBRYD 40 MG TABS Take 40 mg by mouth daily. 08/09/12  Yes Historical Provider, MD  gabapentin (NEURONTIN) 300 MG capsule TAKE ONE CAPSULE BY MOUTH EVERY DAY AT BEDTIME Patient not taking: Reported on 08/06/2014 01/02/13   Kathrynn Ducking, MD   BP 133/80 mmHg  Pulse 64  Temp(Src)  98.2 F (36.8 C)  Resp 11  Ht 5\' 3"  (1.6 m)  Wt 113 lb (51.256 kg)  BMI 20.02 kg/m2  SpO2 99%  LMP 05/20/2010 Physical Exam  Constitutional: She appears well-developed and well-nourished. No distress.  HENT:  Head: Normocephalic and atraumatic.  Eyes: Pupils are equal, round, and reactive to light.  Neck: Normal range of motion. Neck supple.  Cardiovascular: Normal rate and regular rhythm.   Pulmonary/Chest: Effort normal.  Abdominal: Soft. Bowel sounds are normal. She exhibits no distension. There is no tenderness. There is CVA tenderness (left). There is no rebound and no guarding.  Neurological: She is alert.  Skin: Skin is warm and dry.  Nursing note and vitals reviewed.   ED Course  Procedures (including critical care time) Labs Review Labs Reviewed  URINALYSIS, ROUTINE W REFLEX MICROSCOPIC (NOT AT [facility]) - Abnormal; Notable for the following:    APPearance CLOUDY (*)    All other components within normal limits  BASIC METABOLIC PANEL - Abnormal; Notable for the following:    Glucose, Bld 117 (*)    All other components within normal limits  CBC WITH DIFFERENTIAL/PLATELET    Imaging Review Ct Abdomen Pelvis Wo Contrast  12/26/2014   CLINICAL DATA:  Left abdominal pain. Recently diagnosed kidney stones with possible hydronephrosis. Nausea and weakness. Smoker.  EXAM: CT ABDOMEN AND PELVIS WITHOUT CONTRAST  TECHNIQUE: Multidetector CT imaging of the abdomen and pelvis was performed following the standard protocol without IV contrast.  COMPARISON:  Abdomen and pelvis radiographs obtained yesterday and abdomen and pelvis CT dated 05/03/2013.  FINDINGS: A tiny right lobe liver cyst is unchanged on image 10. There is also a small right lobe liver cyst on image number 20 without significant change. There are 2 tiny right renal calculi 80. There are also 2 small left renal calculi. The largest measures 7 mm in maximum diameter. No bladder or ureteral calculi and no hydronephrosis.   Unremarkable non contrasted appearance of the spleen, pancreas, gallbladder, adrenal glands and ovaries. Surgically absent uterus. No gastrointestinal abnormalities or enlarged lymph nodes. Mild bilateral hip degenerative changes. Minimal bilateral dependent atelectasis or scarring. The lungs are mildly hyperexpanded at the lung bases. Mild lumbar and lower thoracic spine degenerative changes.  IMPRESSION: 1. Small, bilateral, nonobstructing renal calculi. 2. Mild bilateral hip degenerative changes. 3. Mild changes of COPD at the lung bases.   Electronically Signed   By: Claudie Revering M.D.   On: 12/26/2014 16:52   Dg Abd 1 View  12/25/2014   CLINICAL DATA:  Left flank pain for 1 week.  EXAM: ABDOMEN - 1 VIEW  COMPARISON:  CT 05/03/2013.  FINDINGS: Soft tissue structures are unremarkable. Tiny renal stones left kidney. Largest measure 6 mm in the left  upper pole. No evidence of urolithiasis. No bowel distention. Degenerative changes lumbar spine. No acute bony abnormality .  IMPRESSION: Left nephrolithiasis.  No evidence of urolithiasis.   Electronically Signed   By: Marcello Moores  Register   On: 12/25/2014 10:03   I have personally reviewed and evaluated these images and lab results as part of my medical decision-making.   EKG Interpretation None      MDM   Final diagnoses:  Other fatigue  Nephrolithiasis   Pt reports overall general fatigue, denies having significant pain. Declines pain medication x 2. Her PCP sent her for further eval to r/o obstruction.  Normal UA, labs unremarkable (CBC and BMP) CT abd/pelv wo contrast shows kidney stones within the kidney, largest at 2mm without any signs of obstruction. Pt continues to deny pain medicine or nausea medicine. She reports that she has not any pain currently. Just reports feeling fatigued but that her 3 children stress her out.  We'll have her follow-up with Dr. Valora Corporal for her stones who she seen previously and her primary care doctor Dr. Ernie Hew  for fatigue. No infection on UTI. Patient denies any pain or nausea medicine for home.   Medications  0.9 %  sodium chloride infusion (not administered)  sodium chloride 0.9 % bolus 1,000 mL (1,000 mLs Intravenous New Bag/Given 12/26/14 1634)  sodium chloride 0.9 % bolus 1,000 mL (1,000 mLs Intravenous New Bag/Given 12/26/14 1634)    49 y.o.Ron Junco Lakeman's evaluation in the Emergency Department is complete. It has been determined that no acute conditions requiring further emergency intervention are present at this time. The patient/guardian have been advised of the diagnosis and plan. We have discussed signs and symptoms that warrant return to the ED, such as changes or worsening in symptoms.  Vital signs are stable at discharge. Filed Vitals:   12/26/14 1630  BP:   Pulse: 64  Temp:   Resp: 11    Patient/guardian has voiced understanding and agreed to follow-up with the PCP or specialist.    Delos Haring, PA-C 12/26/14 1754  Merrily Pew, MD 12/26/14 2227

## 2014-12-26 NOTE — ED Notes (Signed)
Pt being sent by PCP, Ernie Hew MD, d/t L flank pain and 8mm kidney stone.

## 2014-12-26 NOTE — ED Notes (Signed)
Warm blanket given to pt

## 2014-12-26 NOTE — ED Notes (Signed)
Pt reports dx yesterday with left kidney stones with possible blockage. Pt reports nausea and weakness , palpitation . Hx anxiety. Pt sts that she is taking meds for kidney stone. Pt denies severe pain at this time, she describes it as stabbing discomfort that comes and goes.

## 2014-12-26 NOTE — ED Notes (Signed)
Patient transported to CT 

## 2014-12-31 ENCOUNTER — Ambulatory Visit (INDEPENDENT_AMBULATORY_CARE_PROVIDER_SITE_OTHER): Payer: BLUE CROSS/BLUE SHIELD | Admitting: Psychiatry

## 2014-12-31 ENCOUNTER — Encounter (HOSPITAL_COMMUNITY): Payer: Self-pay | Admitting: Psychiatry

## 2014-12-31 VITALS — BP 138/70 | HR 80 | Ht 63.0 in | Wt 112.0 lb

## 2014-12-31 DIAGNOSIS — F411 Generalized anxiety disorder: Secondary | ICD-10-CM | POA: Diagnosis not present

## 2014-12-31 DIAGNOSIS — F332 Major depressive disorder, recurrent severe without psychotic features: Secondary | ICD-10-CM

## 2014-12-31 NOTE — Progress Notes (Signed)
Patient ID: [name], female   DOB: 09-17-1965, 49 y.o.   MRN: [id number]  [place] [id number] Progress Note  [name] [id number] 49 y.o.  12/31/2014 3:40 PM  Chief Complaint: depression got worse  History of Present Illness: Here with husband. He states pt was doing well at the beginning of [place] and he noticed improvement thru out treatment but mostly notably in the beginning. Even notes today that since completing she has some good days where as before [place] she had none.Marland Kitchen He reports pt has a long hx of a  pattern of starting new meds that was only mildly effective initially but when meds were d/c she would have more severe depression episodes. He feels pt is very hopeful about meds will do and is disappointed by the actual effect. Difficult days pt is hopeless, isolative, confined to bed, low motivation, nausea and depression. On good days pt is engaging and making jokes, doing chores around the house and less hopeless and anxious.   Pt completed [place] on 11/19/2014. Pt states due to med changes where she stopped Rexulti due to serotonin syndrome and multiple stressors she stopped responding to [place] half way thru. Pt states in the beginning of [place] she was doing great and describes it as a "rebirth". Pt thinks she had withdrawal symptoms from [place]. States after this she had crying spells before starting each session of [place] but felt a lot of relief at the end of each [place] session. At the completion of [place] she was only on Vibryd but started on Buspar shortly after. At then end of [place] she states depression improved but anxiety continued and was worse due to stressors.   Here today due to continue depression. Pt see's Dr. Curt Jews for MDD treatment and feels they have exhausted all possible treatments and would like to establish care here at [facility]. States she has been fighting depression for over 25 yrs. Pt reports low motivation, low energy, crying spells, anxiety, poor  hygiene, not doing house work, irritability, isolation, body aches, worthlessness and hopelessness. Pt notes she has had some good days where she has little anxiety, mild improvement in energy and more socially engaging.  Anxiety is increased. She is easily overwhelmed by normal tasks. She has uncontrollable crying, itching, shaky feeling, nervous. It lasts all day and all she wants to do is lie in bed.   Pt is sleeping good with Xanax from 9pm-4:30am. She gets up at 4:30am and works out for 1 hrs 5 days a week. Energy is low. Appetite is decreased and she has lost 18 lbs in the last few months. Concentration is ok.   Denies manic and hypomanic symptoms including periods of decreased need for sleep, increased energy, mood lability, impulsivity, FOI, and excessive spending.  Pt is pre-menopausal as confirmed by her Gyn. Pt also has low testosterone.   States Vibryd is keeping from having more severe depression symptoms but is not helping much. States she tried numerous antidepressants (see previous evaluation from May 2015).  [facility] States she has tried it several times and it is no longer helping.   Suicidal Ideation: No but reports if someone gave her a pill to end her life she would take it Plan Formed: No Patient has means to carry out plan: No wants to live for her kids  Homicidal Ideation: No Plan Formed: No Patient has means to carry out plan: No  Review  of Systems: Psychiatric: Agitation: Yes Hallucination: No Depressed Mood: Yes Insomnia: No Hypersomnia: No Altered Concentration: No Feels Worthless: Yes Grandiose Ideas: No Belief In Special Powers: No New/Increased Substance Abuse: Yes smoking 1/2 ppd. Denies illicit drug use. Drinks about once a month.  Compulsions: No  Neurologic: Headache: No Seizure: No Paresthesias: Yes arms  Review of Systems  Constitutional: Positive for weight loss and  malaise/fatigue. Negative for fever and chills.  HENT: Negative for congestion, ear pain, nosebleeds, sore throat and tinnitus.   Eyes: Negative for blurred vision, double vision and pain.  Respiratory: Negative for cough, sputum production and shortness of breath.   Cardiovascular: Negative for chest pain, palpitations and leg swelling.  Gastrointestinal: Positive for nausea. Negative for heartburn, vomiting and abdominal pain.  Genitourinary: Positive for flank pain. Negative for dysuria, urgency, frequency and hematuria.  Musculoskeletal: Positive for joint pain. Negative for myalgias, back pain and neck pain.  Skin: Positive for itching. Negative for rash.  Neurological: Positive for sensory change. Negative for dizziness, seizures, loss of consciousness and headaches.  Psychiatric/Behavioral: Positive for depression. Negative for suicidal ideas, hallucinations and substance abuse. The patient is nervous/anxious. The patient does not have insomnia.       Past Medical Family, Social History: married and has 3 kids. 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    Outpatient Encounter Prescriptions as of 12/31/2014  Medication Sig  . ALPRAZolam (XANAX) 1 MG tablet Take 2 mg by mouth at bedtime as needed for anxiety.   . busPIRone (BUSPAR) 15 MG tablet Take 15 mg by mouth 2 (two) times daily.  Marland Kitchen ketorolac (TORADOL) 10 MG tablet Take 10 mg by mouth every 6 (six) hours as needed for moderate pain.   . tamsulosin (FLOMAX) 0.4 MG CAPS capsule Take 0.4 mg by mouth daily.  Marland Kitchen VIIBRYD 40 MG TABS Take 40 mg by mouth daily.  Marland Kitchen gabapentin (NEURONTIN) 300 MG capsule TAKE ONE CAPSULE BY MOUTH EVERY DAY AT BEDTIME (Patient not taking: Reported on 08/06/2014)   No facility-administered encounter medications on file as of 12/31/2014.    Past Psychiatric History/Hospitalization(s): Anxiety: No Bipolar Disorder: No Depression: Yes Mania: No Psychosis: No Schizophrenia: No Personality  Disorder: No Hospitalization for psychiatric illness: No History of Electroconvulsive Shock Therapy: No Prior Suicide Attempts: Yes  Physical Exam: Constitutional:  BP 138/70 mmHg  Pulse 80  Ht 5\' 3"  (1.6 m)  Wt 112 lb (50.803 kg)  BMI 19.84 kg/m2  LMP 05/20/2010  General Appearance: alert, oriented, no acute distress  Musculoskeletal: Strength & Muscle Tone: within normal limits Gait & Station: normal Patient leans: N/A  Mental Status Examination/Evaluation: Objective: Attitude: Calm and cooperative  Appearance: Casual, appears to be stated age  Eye Contact::  Fair  Speech:  Clear and Coherent and Normal Rate  Volume:  Normal  Mood:  depressed  Affect:  Depressed and Tearful  Thought Process:  Goal Directed  Orientation:  Full (Time, Place, and Person)  Thought Content:  Negative  Suicidal Thoughts:  No  Homicidal Thoughts:  No  Judgement:  Intact  Insight:  Present  Concentration: good  Memory: Immediate-fair Recent-fair Remote-fair  Recall: fair  Language: fair  Gait and Station: normal  [employer] of Knowledge: average  Psychomotor Activity:  Normal  Akathisia:  No  Handed:  Right  AIMS (if indicated):  Facial and Oral Movements  Muscles of Facial Expression: None, normal  Lips and Perioral Area: None, normal  Jaw: None, normal  Tongue: None, normal Extremity Movements: Upper (arms, wrists, hands, fingers): None, normal  Lower (legs, knees, ankles, toes): None, normal,  Trunk Movements:  Neck, shoulders, hips: None, normal,  Overall Severity : Severity of abnormal movements (highest score from questions above): None, normal  Incapacitation due to abnormal movements: None, normal  Patient's awareness of abnormal movements (rate only patient's report): No Awareness, Dental Status  Current problems with teeth and/or dentures?: No  Does patient usually wear dentures?: No    Assets:  Communication Skills Desire for Improvement Financial  Resources/Insurance Housing Intimacy Leisure Time Physical Health Social Support Engineer, drilling (Choose Three): Review of Psycho-Social Stressors (1), Review or order clinical lab tests (1), Established Problem, Worsening (2), Review of Last Therapy Session (1), Review of Medication Regimen & Side Effects (2) and Review of New Medication or Change in Dosage (2)  Assessment: Axis I: MDD- severe, recurrent without psychotic features; GAD  Axis II: deferred  Axis III:  Past Medical History  Diagnosis Date  . Galactorrhea   . Pelvic relaxation   . Anal itching   . Cystocele   . Rectocele   . Depression   . Decreased libido   . Breast nodule     left  . Pelvic pain in female   . Abdominal pain   . Breast inflammation   . Mastodynia   . Fibroadenoma   . Serotonin syndrome   . Kidney stones     Axis IV: poor coping skills  Axis V: GAF 51-60   Plan:  Pt is interested in changing providers from Dr. Curt Jews to myself at this time.   ECT- possibility in the futture but not now due to transportation and cost. She would like to consider [place] in the future if meds are ineffective.  Discussed developing a daily routine and goal setting as measures for improving depression.  Pt is make a list of realistic expectation vs hope of what meds can do to be discussed at her next appointment. Therapy: brief supportive therapy provided. Discussed psychosocial stressors in detail.   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 Pt will research and discuss possiblity at next appt.   Medication management with supportive therapy. Risks/benefits and SE of the medication  discussed. Pt verbalized understanding and verbal consent obtained for treatment.  Affirm with the patient that the medications are taken as ordered. Patient expressed understanding of how their medications were to be used.   Labs reviewed frp, 12/26/2014 BMP WNL except glu 117, CBC WNL  Pt denies SI and is at an acute low risk for suicide.Patient told to call clinic if any problems occur. Patient advised to go to ER if they should develop SI/HI, side effects, or if symptoms worsen. Has crisis numbers to call if needed. Pt verbalized understanding.  F/up in 1-2 weeks or sooner if needed Charlcie Cradle, MD 12/31/2014

## 2015-01-14 ENCOUNTER — Ambulatory Visit (INDEPENDENT_AMBULATORY_CARE_PROVIDER_SITE_OTHER): Payer: BLUE CROSS/BLUE SHIELD | Admitting: Psychiatry

## 2015-01-14 ENCOUNTER — Encounter (HOSPITAL_COMMUNITY): Payer: Self-pay | Admitting: Psychiatry

## 2015-01-14 VITALS — BP 122/78 | HR 86 | Ht 63.0 in | Wt 109.8 lb

## 2015-01-14 DIAGNOSIS — F411 Generalized anxiety disorder: Secondary | ICD-10-CM | POA: Diagnosis not present

## 2015-01-14 DIAGNOSIS — F332 Major depressive disorder, recurrent severe without psychotic features: Secondary | ICD-10-CM | POA: Diagnosis not present

## 2015-01-14 NOTE — Progress Notes (Signed)
Patient ID: [name], female   DOB: 1965-09-26, 49 y.o.   MRN: [id number]  [facility] [id number] Progress Note  [name] [id number] 49 y.o.  01/14/2015 2:15 PM  Chief Complaint: f/up about meds  History of Present Illness: Here with husband. Pt last seen about 2 weeks ago. Pt reports no change in depression and anxiety symptoms. Today states she is very frustrated and depressed. States quality of life is poor and she can barely get out of bed.  Pt did not do her assigned homework. Pt was to bring in a list of her expectations vs hope of the effect of antidepressants can have. Reports she has a long hx of having mild improvement to antidepressants and then significant worsening of symptoms when the antidepressant is stopped.     Pt has stopped use of Buspar and wants to stop Vibryd as she states it is not effective.   Suicidal Ideation: No but reports if someone gave her a pill to end her life she would take it Plan Formed: No Patient has means to carry out plan: No wants to live for her kids  Homicidal Ideation: No Plan Formed: No Patient has means to carry out plan: No  Review of Systems: Psychiatric: Agitation: Yes Hallucination: No Depressed Mood: Yes Insomnia: No Hypersomnia: No Altered Concentration: No Feels Worthless: Yes Grandiose Ideas: No Belief In Special Powers: No New/Increased Substance Abuse: Yes smoking 1/2 ppd. Denies illicit drug use. Drinks about once a month.  Compulsions: No  Neurologic: Headache: No Seizure: No Paresthesias: Yes arms  Review of Systems  Constitutional: Positive for weight loss and malaise/fatigue. Negative for fever and chills.  HENT: Negative for congestion, ear pain, nosebleeds, sore throat and tinnitus.   Eyes: Negative for blurred vision, double vision and pain.  Respiratory: Negative for cough, sputum production and shortness of breath.   Cardiovascular: Negative for chest pain, palpitations and  leg swelling.  Gastrointestinal: Positive for vomiting. Negative for heartburn, nausea and abdominal pain.  Genitourinary: Negative for dysuria, urgency, frequency, hematuria and flank pain.  Musculoskeletal: Negative for myalgias, back pain, joint pain and neck pain.  Skin: Negative for itching and rash.  Neurological: Positive for sensory change. Negative for dizziness, seizures, loss of consciousness and headaches.  Psychiatric/Behavioral: Positive for depression. Negative for suicidal ideas, hallucinations and substance abuse. The patient is nervous/anxious. The patient does not have insomnia.       Past Medical Family, Social History: married and has 3 kids. 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   reports that she has been smoking Cigarettes.  She has been smoking about 0.50 packs per day. She has never used smokeless tobacco. She reports that she drinks alcohol. She reports that she does not use illicit drugs.   Family History  Problem Relation Age of Onset  . Heart disease Mother     angioplasty  . Cancer Father     prostate  . Hypertension Father   . Depression Father   . Bipolar disorder Father   . Hypertension Maternal Grandmother   . Diabetes Maternal Grandmother     borderline  . Hypertension Paternal Grandmother   . Arthritis Paternal Grandmother   . Stroke Paternal Grandmother   . Rheum arthritis Paternal Grandfather   . Rheum arthritis Sister   . Rheum arthritis Brother     Past Medical History  Diagnosis Date  . Galactorrhea   . Pelvic relaxation   . Anal itching   .  Cystocele   . Rectocele   . Depression   . Decreased libido   . Breast nodule     left  . Pelvic pain in female   . Abdominal pain   . Breast inflammation   . Mastodynia   . Fibroadenoma   . Serotonin syndrome   . Kidney stones      Outpatient Encounter Prescriptions as of 01/14/2015  Medication Sig  . ALPRAZolam (XANAX) 1 MG tablet Take 2 mg by mouth at  bedtime as needed for anxiety.   . gabapentin (NEURONTIN) 300 MG capsule TAKE ONE CAPSULE BY MOUTH EVERY DAY AT BEDTIME  . ketorolac (TORADOL) 10 MG tablet Take 10 mg by mouth every 6 (six) hours as needed for moderate pain.   . tamsulosin (FLOMAX) 0.4 MG CAPS capsule Take 0.4 mg by mouth daily.  Marland Kitchen VIIBRYD 40 MG TABS Take 40 mg by mouth daily.  . busPIRone (BUSPAR) 15 MG tablet Take 15 mg by mouth 2 (two) times daily.   No facility-administered encounter medications on file as of 01/14/2015.    Past Psychiatric History/Hospitalization(s): Anxiety: No Bipolar Disorder: No Depression: Yes Mania: No Psychosis: No Schizophrenia: No Personality Disorder: No Hospitalization for psychiatric illness: No History of Electroconvulsive Shock Therapy: No Prior Suicide Attempts: Yes  Physical Exam: Constitutional:  BP 122/78 mmHg  Pulse 86  Ht 5\' 3"  (1.6 m)  Wt 109 lb 12.8 oz (49.805 kg)  BMI 19.46 kg/m2  LMP 05/20/2010  General Appearance: alert, oriented, no acute distress  Musculoskeletal: Strength & Muscle Tone: within normal limits Gait & Station: normal Patient leans: N/A  Mental Status Examination/Evaluation: Objective: Attitude: Calm and cooperative  Appearance: Casual, appears to be stated age  Eye Contact::  Fair  Speech:  Clear and Coherent and Normal Rate  Volume:  Normal  Mood:  depressed  Affect:  Depressed and Tearful  Thought Process:  Goal Directed  Orientation:  Full (Time, Place, and Person)  Thought Content:  Negative  Suicidal Thoughts:  No  Homicidal Thoughts:  No  Judgement:  Intact  Insight:  Present  Concentration: good  Memory: Immediate-fair Recent-fair Remote-fair  Recall: fair  Language: fair  Gait and Station: normal  [employer] of Knowledge: average  Psychomotor Activity:  Normal  Akathisia:  No  Handed:  Right  AIMS (if indicated):  n/a   Assets:  Armed forces logistics/support/administrative officer Desire for Improvement Financial  Resources/Insurance Housing Intimacy Leisure Time Physical Health Social Support Engineer, drilling (Choose Three): Review of Psycho-Social Stressors (1), Established Problem, Worsening (2), Review of Last Therapy Session (1), Review of Medication Regimen & Side Effects (2) and Review of New Medication or Change in Dosage (2)  Assessment: Axis I: MDD- severe, recurrent without psychotic features; GAD  Axis II: deferred  Axis III:  Past Medical History  Diagnosis Date  . Galactorrhea   . Pelvic relaxation   . Anal itching   . Cystocele   . Rectocele   . Depression   . Decreased libido   . Breast nodule     left  . Pelvic pain in female   . Abdominal pain   . Breast inflammation   . Mastodynia   . Fibroadenoma   . Serotonin syndrome   . Kidney stones     Axis IV: poor coping skills  Axis V: GAF 51-60   Plan:  Pt is interested in changing providers from Dr. Curt Jews to myself at this time.  ECT- possibility in the futture but not now due to transportation and cost. She would like to consider [place] in the future if meds are ineffective.  Discussed developing a daily routine and goal setting as measures for improving depression.  Pt is make a list of realistic expectation vs hope of what meds can do to be discussed at her next appointment. Therapy: brief supportive therapy provided. Discussed psychosocial stressors in detail.   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 Pt will research and discuss possiblity at next appt.   Medication management with supportive therapy. Risks/benefits and SE of the medication discussed. Pt verbalized understanding and verbal consent obtained for treatment.  Affirm with the patient that the medications are taken as ordered. Patient expressed  understanding of how their medications were to be used.   Labs reviewed frp, 12/26/2014 BMP WNL except glu 117, CBC WNL  Pt denies SI and is at an acute low risk for suicide.Patient told to call clinic if any problems occur. Patient advised to go to ER if they should develop SI/HI, side effects, or if symptoms worsen. Has crisis numbers to call if needed. Pt verbalized understanding.  F/up in 2 days or sooner if needed Charlcie Cradle, MD 01/14/2015

## 2015-01-16 ENCOUNTER — Ambulatory Visit (INDEPENDENT_AMBULATORY_CARE_PROVIDER_SITE_OTHER): Payer: BLUE CROSS/BLUE SHIELD | Admitting: Psychiatry

## 2015-01-16 ENCOUNTER — Encounter (HOSPITAL_COMMUNITY): Payer: Self-pay | Admitting: Psychiatry

## 2015-01-16 VITALS — BP 128/88 | HR 95 | Ht 63.0 in | Wt 108.0 lb

## 2015-01-16 DIAGNOSIS — F411 Generalized anxiety disorder: Secondary | ICD-10-CM

## 2015-01-16 DIAGNOSIS — F332 Major depressive disorder, recurrent severe without psychotic features: Secondary | ICD-10-CM

## 2015-01-16 MED ORDER — METHYLPHENIDATE HCL 5 MG PO TABS: 5.0000 mg | ORAL_TABLET | Freq: Every day | ORAL | Status: DC

## 2015-01-16 MED ORDER — VORTIOXETINE HBR 5 MG PO TABS: 5.0000 mg | ORAL_TABLET | Freq: Every day | ORAL | Status: DC

## 2015-01-16 MED ORDER — ALPRAZOLAM 1 MG PO TABS: ORAL_TABLET | ORAL | Status: DC

## 2015-01-16 NOTE — Progress Notes (Signed)
[facility] [phone] Progress Note  TRINITI GRUETZMACHER [id number] 49 y.o.  01/16/2015 11:37 AM  Chief Complaint: f/up about meds  History of Present Illness: Here with husband. Husband is very frustrated and feels worn out by her depression. States he doesn't expect the medication to work based on her previous hx. States TMS was effective until all the stressors happened.  Pt last seen about 2 days ago. Pt reports no change in depression and anxiety symptoms. Reports ongoing sad mood, crying spells, irritability, hopelessness, lack of energy and motivation, nausea and poor appetite, worthlessness, poor concentration and indecision. Pt states she is unable to get out of bed, interact with her kids and do chores. Pt spends all her time in bed. Pt takes Xanas 2mg  at bedtime and it helps her sleep. She takes Xanax 0.5mg  BID for anxiety but it doesn't do much.   States she hopes the meds will give her energy, take away the hopelessness, ease her anxiety and improve her mood. She expects the meds will help her depression even though nothing has worked in the past. States Cymbalta was terrible because she had anxiety and panic attacks and withdrawal symptoms from it.     Pt has stopped use of Buspar and wants to stop Vibryd as she states it is not effective and denies withdrawal symptoms.    States dad suffered from severe depression and had ECT. Mother told her that her dad never improved despite ECT. He died 31 yrs ago from cancer.   Pt reports she was on Vyvanse for many years and it helped her energy but not her mood.   Pt would like to try [place] again as it seemed effective in the beginning.   Suicidal Ideation: Yes Plan Formed: Yes OD on pills. Pt denies collecting pills and states she doesn't want to die Patient has means to carry out plan: No wants to live for her kids  Homicidal Ideation: No Plan Formed: No Patient has means to carry out plan: No  Review of  Systems: Psychiatric: Agitation: Yes Hallucination: No Depressed Mood: Yes Insomnia: No Hypersomnia: No Altered Concentration: Yes Feels Worthless: Yes Grandiose Ideas: No Belief In Special Powers: No New/Increased Substance Abuse: Yes smoking 1/2 ppd. Denies illicit drug use. Drinks about once a month.  Compulsions: No  Neurologic: Headache: No Seizure: No Paresthesias: Yes arms  Review of Systems  Constitutional: Positive for weight loss and malaise/fatigue. Negative for fever and chills.  HENT: Negative for congestion, ear pain, nosebleeds, sore throat and tinnitus.   Eyes: Negative for blurred vision, double vision and pain.  Respiratory: Negative for cough, sputum production and shortness of breath.   Cardiovascular: Negative for chest pain, palpitations and leg swelling.  Gastrointestinal: Positive for vomiting. Negative for heartburn, nausea and abdominal pain.  Genitourinary: Negative for dysuria, urgency, frequency, hematuria and flank pain.  Musculoskeletal: Negative for myalgias, back pain, joint pain and neck pain.  Skin: Negative for itching and rash.  Neurological: Positive for sensory change. Negative for dizziness, seizures, loss of consciousness and headaches.  Psychiatric/Behavioral: Positive for depression. Negative for suicidal ideas, hallucinations and substance abuse. The patient is nervous/anxious. The patient does not have insomnia.       Past Medical Family, Social History: married and has 3 kids. 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   reports that she has been smoking Cigarettes.  She has been smoking about 0.50 packs per day. She has never used smokeless tobacco.  She reports that she drinks alcohol. She reports that she does not use illicit drugs.   Family History  Problem Relation Age of Onset  . Heart disease Mother     angioplasty  . Cancer Father     prostate  . Hypertension Father   . Depression Father   . Bipolar  disorder Father   . Hypertension Maternal Grandmother   . Diabetes Maternal Grandmother     borderline  . Hypertension Paternal Grandmother   . Arthritis Paternal Grandmother   . Stroke Paternal Grandmother   . Rheum arthritis Paternal Grandfather   . Rheum arthritis Sister   . Rheum arthritis Brother     Past Medical History  Diagnosis Date  . Galactorrhea   . Pelvic relaxation   . Anal itching   . Cystocele   . Rectocele   . Depression   . Decreased libido   . Breast nodule     left  . Pelvic pain in female   . Abdominal pain   . Breast inflammation   . Mastodynia   . Fibroadenoma   . Serotonin syndrome   . Kidney stones      Outpatient Encounter Prescriptions as of 01/16/2015  Medication Sig  . ALPRAZolam (XANAX) 1 MG tablet Take 2 mg by mouth at bedtime as needed for anxiety.   . gabapentin (NEURONTIN) 300 MG capsule TAKE ONE CAPSULE BY MOUTH EVERY DAY AT BEDTIME (Patient not taking: Reported on 01/16/2015)  . ketorolac (TORADOL) 10 MG tablet Take 10 mg by mouth every 6 (six) hours as needed for moderate pain.   . tamsulosin (FLOMAX) 0.4 MG CAPS capsule Take 0.4 mg by mouth daily.   No facility-administered encounter medications on file as of 01/16/2015.    Past Psychiatric History/Hospitalization(s): Anxiety: No Bipolar Disorder: No Depression: Yes Mania: No Psychosis: No Schizophrenia: No Personality Disorder: No Hospitalization for psychiatric illness: No History of Electroconvulsive Shock Therapy: No Prior Suicide Attempts: Yes  Physical Exam: Constitutional:  BP 128/88 mmHg  Pulse 95  Ht 5\' 3"  (1.6 m)  Wt 108 lb (48.988 kg)  BMI 19.14 kg/m2  LMP 05/20/2010  General Appearance: alert, oriented, no acute distress  Musculoskeletal: Strength & Muscle Tone: within normal limits Gait & Station: normal Patient leans: N/A  Mental Status Examination/Evaluation: Objective: Attitude: Calm and cooperative  Appearance: Casual, appears to be stated  age  Eye Contact::  Fair  Speech:  Clear and Coherent and Normal Rate  Volume:  Normal  Mood:  depressed  Affect:  Depressed and Tearful  Thought Process:  Goal Directed  Orientation:  Full (Time, Place, and Person)  Thought Content:  Negative  Suicidal Thoughts:  No  Homicidal Thoughts:  No  Judgement:  Intact  Insight:  Present  Concentration: good  Memory: Immediate-fair Recent-fair Remote-fair  Recall: fair  Language: fair  Gait and Station: normal  [employer] of Knowledge: average  Psychomotor Activity:  Normal  Akathisia:  No  Handed:  Right  AIMS (if indicated):  n/a   Assets:  Armed forces logistics/support/administrative officer Desire for Improvement Financial Resources/Insurance Housing Intimacy Leisure Time Physical Health Social Support Engineer, drilling (Choose Three): Review of Psycho-Social Stressors (1), Established Problem, Worsening (2), Review of Last Therapy Session (1), Review of Medication Regimen & Side Effects (2) and Review of New Medication or Change in Dosage (2)  Assessment: Axis I: MDD- severe, recurrent without psychotic features; GAD  Axis II: deferred  Axis III:  Past Medical History  Diagnosis Date  . Galactorrhea   . Pelvic relaxation   . Anal itching   . Cystocele   . Rectocele   . Depression   . Decreased libido   . Breast nodule     left  . Pelvic pain in female   . Abdominal pain   . Breast inflammation   . Mastodynia   . Fibroadenoma   . Serotonin syndrome   . Kidney stones     Axis IV: poor coping skills  Axis V: GAF 51-60   Plan:  Will attempt to get insurance approval of [place]  Discussed developing a daily routine and goal setting as measures for improving depression.  Pt made a list of realistic expectation vs hope of what meds can do and we discussed it in detail.  Therapy: brief supportive therapy provided. Discussed psychosocial stressors in detail.   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 Goal to increase if pt can tolerate.   Medication management with supportive therapy. Risks/benefits and SE of the medication discussed. Pt verbalized understanding and verbal consent obtained for treatment.  Affirm with the patient that the medications are taken as ordered. Patient expressed understanding of how their medications were to be used.   Labs reviewed frp, 12/26/2014 BMP WNL except glu 117, CBC WNL  Pt denies SI and is at an acute low risk for suicide.Patient told to call clinic if any problems occur. Patient advised to go to ER if they should develop SI/HI, side effects, or if symptoms worsen. Has crisis numbers to call if needed. Pt verbalized understanding.  F/up in 4 weeks or sooner if needed Charlcie Cradle, MD 01/16/2015

## 2015-01-28 ENCOUNTER — Telehealth (HOSPITAL_COMMUNITY): Payer: Self-pay | Admitting: *Deleted

## 2015-01-28 ENCOUNTER — Telehealth (HOSPITAL_COMMUNITY): Payer: Self-pay

## 2015-01-28 NOTE — Telephone Encounter (Signed)
Telephone call with Lowella Grip, representative at [employer] to attempt to have patient's Trintellix approved but was informed patient's status with insurance was inactive.  Representative requested patient call her insurance to see if there was just something missing or needed updating so patient could then request approval for the medication but at this point patient is showing as inactive.  Left patient a message with this information and requested she call this nurse back to discuss and requested she also follow up with her insurance to make sure coverage is available.

## 2015-01-28 NOTE — Telephone Encounter (Signed)
Called for authorization of Trintellix. Spoke with Odie Sera. Was told to complete online with cover my meds. Submitted and awaiting decision to be faxed.

## 2015-01-29 ENCOUNTER — Telehealth (HOSPITAL_COMMUNITY): Payer: Self-pay | Admitting: *Deleted

## 2015-01-29 NOTE — Telephone Encounter (Signed)
Prior authorization for Trintellix has been approved online with cover my meds. Called to notify pharmacy of approval and it went through without problems.

## 2015-01-30 ENCOUNTER — Encounter: Payer: Self-pay | Admitting: Gastroenterology

## 2015-01-30 ENCOUNTER — Telehealth (HOSPITAL_COMMUNITY): Payer: Self-pay

## 2015-01-30 NOTE — Telephone Encounter (Signed)
Medication management - Telephone call with patient to inform her Trintellix was approved and could be filled.  Pt. reported she had 3-4 really good days and was going to hold off on starting new medication.  Agreed to inform Dr. Doyne Keel but reminded patient the Trintellix could now be filled.  Patient agreed to call back if any problems but stated she just wanted to see if she could do without the medication for now.  Requested if she started the medication to keep [place] informed or if problems.

## 2015-02-13 ENCOUNTER — Encounter (HOSPITAL_COMMUNITY): Payer: Self-pay | Admitting: Psychiatry

## 2015-02-13 ENCOUNTER — Ambulatory Visit (INDEPENDENT_AMBULATORY_CARE_PROVIDER_SITE_OTHER): Payer: BLUE CROSS/BLUE SHIELD | Admitting: Psychiatry

## 2015-02-13 VITALS — BP 125/87 | HR 85 | Ht 63.0 in | Wt 105.2 lb

## 2015-02-13 DIAGNOSIS — F332 Major depressive disorder, recurrent severe without psychotic features: Secondary | ICD-10-CM

## 2015-02-13 DIAGNOSIS — F411 Generalized anxiety disorder: Secondary | ICD-10-CM

## 2015-02-13 NOTE — Progress Notes (Signed)
Patient ID: [name], female   DOB: 03-Sep-1965, 49 y.o.   MRN: [id number]  [facility] Progress Note  [name] [id number] 49 y.o.  02/13/2015 11:45 AM  Chief Complaint: doing good  History of Present Illness: Here alone today.   Pt states she has been gradually has been doing better. Thinks her anxiety ramped up her symptoms but [place] worked. She feels like [place] worked. Pt never started Trintillex. No longer taking Ritalin. Only taking Xanax to sleep.   Depression seems to be have resolved. Pt is motivated. She is working and doing house work. She feels content and is living her life. Pt is happy and interactive with her family. Keeping up with her kids busy schedule. Denies anhedonia, crying spells, worthlessness, hopelessness. Denise SI/HI. Pt is in a support group thru a [employer]. States it is really helping.   Anxiety is still present. She is having dry mouth. Anxiety is very situational and she gets tingling her hands and arms and upset stomach. She does not feel like she needs to be on meds right now.   Pt states sleep and energy are good. Appetite is improving slowly. She is eating small snacks all the thru out the day. She has lost 25 lbs since July.   Pt states husband is not supportive at all and they have issues they are dealing with. Husband is very anti-drug.   Pt is taking herbal supplements in the morning and it is helping.    Suicidal Ideation: No Plan Formed: No  Patient has means to carry out plan: No wants to live for her kids  Homicidal Ideation: No Plan Formed: No Patient has means to carry out plan: No  Review of Systems: Psychiatric: Agitation: No Hallucination: No Depressed Mood: No Insomnia: No Hypersomnia: No Altered Concentration: No Feels Worthless: No Grandiose Ideas: No Belief In Special Powers: No New/Increased Substance Abuse: Yes smoking 1/2 ppd. Denies illicit drug use. Drinks about once a month.   Compulsions: No  Neurologic: Headache: No Seizure: No Paresthesias: No  Review of Systems  Constitutional: Positive for weight loss. Negative for fever, chills and malaise/fatigue.  HENT: Negative for congestion, ear pain, nosebleeds, sore throat and tinnitus.   Eyes: Negative for blurred vision, double vision and pain.  Respiratory: Negative for cough, sputum production and shortness of breath.   Cardiovascular: Negative for chest pain, palpitations and leg swelling.  Gastrointestinal: Positive for abdominal pain. Negative for heartburn, nausea, vomiting, diarrhea and constipation.  Genitourinary: Negative for dysuria, urgency, frequency, hematuria and flank pain.  Musculoskeletal: Negative for myalgias, back pain, joint pain and neck pain.  Skin: Negative for itching and rash.  Neurological: Negative for dizziness, sensory change, seizures, loss of consciousness and headaches.  Psychiatric/Behavioral: Negative for depression, suicidal ideas, hallucinations and substance abuse. The patient is nervous/anxious. The patient does not have insomnia.       Past Medical Family, Social History: married and has 3 kids. 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   reports that she has been smoking Cigarettes.  She has been smoking about 0.50 packs per day. She has never used smokeless tobacco. She reports that she drinks alcohol. She reports that she does not use illicit drugs.   Family History  Problem Relation Age of Onset  . Heart disease Mother     angioplasty  . Cancer Father     prostate  . Hypertension Father   . Depression Father   .  Bipolar disorder Father   . Hypertension Maternal Grandmother   . Diabetes Maternal Grandmother     borderline  . Hypertension Paternal Grandmother   . Arthritis Paternal Grandmother   . Stroke Paternal Grandmother   . Rheum arthritis Paternal Grandfather   . Rheum arthritis Sister   . Rheum arthritis Brother     Past  Medical History  Diagnosis Date  . Galactorrhea   . Pelvic relaxation   . Anal itching   . Cystocele   . Rectocele   . Depression   . Decreased libido   . Breast nodule     left  . Pelvic pain in female   . Abdominal pain   . Breast inflammation   . Mastodynia   . Fibroadenoma   . Serotonin syndrome   . Kidney stones      Outpatient Encounter Prescriptions as of 02/13/2015  Medication Sig  . ALPRAZolam (XANAX) 1 MG tablet 1mg  po BID and 2mg  po qHS for anxiety  . gabapentin (NEURONTIN) 300 MG capsule TAKE ONE CAPSULE BY MOUTH EVERY DAY AT BEDTIME (Patient not taking: Reported on 01/16/2015)  . ketorolac (TORADOL) 10 MG tablet Take 10 mg by mouth every 6 (six) hours as needed for moderate pain.   . methylphenidate (RITALIN) 5 MG tablet Take 1 tablet (5 mg total) by mouth daily. (Patient not taking: Reported on 02/13/2015)  . tamsulosin (FLOMAX) 0.4 MG CAPS capsule Take 0.4 mg by mouth daily.  . Vortioxetine HBr (TRINTELLIX) 5 MG TABS Take 1 tablet (5 mg total) by mouth daily. (Patient not taking: Reported on 02/13/2015)   No facility-administered encounter medications on file as of 02/13/2015.    Past Psychiatric History/Hospitalization(s): Anxiety: No Bipolar Disorder: No Depression: Yes Mania: No Psychosis: No Schizophrenia: No Personality Disorder: No Hospitalization for psychiatric illness: No History of Electroconvulsive Shock Therapy: No Prior Suicide Attempts: Yes  Physical Exam: Constitutional:  BP 125/87 mmHg  Pulse 85  Ht 5\' 3"  (1.6 m)  Wt 105 lb 3.2 oz (47.718 kg)  BMI 18.64 kg/m2  LMP 05/20/2010  General Appearance: alert, oriented, no acute distress  Musculoskeletal: Strength & Muscle Tone: within normal limits Gait & Station: normal Patient leans: N/A  Mental Status Examination/Evaluation: Objective: Attitude: Calm and cooperative  Appearance: Fairly Groomed, appears to be stated age  Engineer, water::  Fair  Speech:  Clear and Coherent and  Normal Rate  Volume:  Normal  Mood:  euthymic  Affect:  Appropriate  Thought Process:  Goal Directed  Orientation:  Full (Time, Place, and Person)  Thought Content:  Negative  Suicidal Thoughts:  No  Homicidal Thoughts:  No  Judgement:  Intact  Insight:  Present  Concentration: good  Memory: Immediate-fair Recent-fair Remote-fair  Recall: fair  Language: fair  Gait and Station: normal  [employer] of Knowledge: average  Psychomotor Activity:  Normal  Akathisia:  No  Handed:  Right  AIMS (if indicated):  n/a   Assets:  Armed forces logistics/support/administrative officer Desire for Improvement Financial Resources/Insurance [place] Engineer, drilling (Choose Three): Established Problem, Stable/Improving (1), Review of Psycho-Social Stressors (1) and Review of Last Therapy Session (1)  Assessment: Axis I: MDD- severe, recurrent without psychotic features; GAD- reports significant improvement in overall symptoms.   Axis II: deferred    Plan:  Discussed developing a daily routine and goal setting as measures for improving depression.   Therapy: brief supportive therapy provided. Discussed  psychosocial stressors in detail.   Pt encouraged to continue group therapy  D/c Ritalin  Xanax 1mg  po BID and 2mg  po qhs for sleep and anxiety D/c Trintellix   Medication management with supportive therapy. Risks/benefits and SE of the medication discussed. Pt verbalized understanding and verbal consent obtained for treatment.  Affirm with the patient that the medications are taken as ordered. Patient expressed understanding of how their medications were to be used.   Labs reviewed frp, 12/26/2014 BMP WNL except glu 117, CBC WNL  Pt denies SI and is at an acute low risk for suicide.Patient told to call clinic if any problems occur. Patient advised to go to ER if they should develop SI/HI, side effects, or if  symptoms worsen. Has crisis numbers to call if needed. Pt verbalized understanding.  F/up in 6 weeks or sooner if needed Charlcie Cradle, MD 02/13/2015

## 2015-03-05 ENCOUNTER — Telehealth (HOSPITAL_COMMUNITY): Payer: Self-pay

## 2015-03-05 NOTE — Telephone Encounter (Signed)
Telephone call with patient who called back stating she was in need of a refill of her prescribed Xanax.  Patient reported not overtaking and last filled on 01/16/15.  Informed patient she should have a refill remaining as she reported she had not filled the medication since 01/16/15.  Patient reported she was not at home and did not have her bottle with her to check but would call back if any problems obtaining refill.

## 2015-03-05 NOTE — Telephone Encounter (Signed)
Telephone message left for patient to inform this nurse had gotten a message to call her and requested she call back to discuss any needs or concerns as this was not left on her original message.  Informed patient Dr. Doyne Keel would be back in the office on 03/06/15 if her presence was needed and requested patient call back to discuss any concerns.  Patient next scheduled on 03/27/15 with Dr. Doyne Keel.

## 2015-03-27 ENCOUNTER — Ambulatory Visit (INDEPENDENT_AMBULATORY_CARE_PROVIDER_SITE_OTHER): Payer: BLUE CROSS/BLUE SHIELD | Admitting: Psychiatry

## 2015-03-27 ENCOUNTER — Encounter (HOSPITAL_COMMUNITY): Payer: Self-pay | Admitting: Psychiatry

## 2015-03-27 VITALS — BP 130/78 | HR 88 | Ht 63.0 in | Wt 103.4 lb

## 2015-03-27 DIAGNOSIS — F332 Major depressive disorder, recurrent severe without psychotic features: Secondary | ICD-10-CM

## 2015-03-27 DIAGNOSIS — Z630 Problems in relationship with spouse or partner: Secondary | ICD-10-CM

## 2015-03-27 DIAGNOSIS — F411 Generalized anxiety disorder: Secondary | ICD-10-CM

## 2015-03-27 MED ORDER — CLONAZEPAM 1 MG PO TABS: ORAL_TABLET | ORAL | Status: DC
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
## 2015-03-28 ENCOUNTER — Telehealth (HOSPITAL_COMMUNITY): Payer: Self-pay

## 2015-03-28 NOTE — Telephone Encounter (Signed)
Telephone call with patient, after discussing the message she left that she did not sleep the previous night following a medication change to Clonazepam from Dr. Doyne Keel 03/27/15 with Dr. Donnelly Angelica, helping to cover for provider out this date.  Informed Dr. Lovena Le was not willing to change anything with medications at this time and discussed with patient that she could take the Clonazepam 1 mg, twice a day and then 2 at bedtime and how to possibly help schedule these to be most effective.  Patient agreed to try and informed if still problems to call back when Dr. Doyne Keel is in the office on 04/01/15 as that will be the only day in the coming week provider will be working.  Patient stated agreement with plan and will call back if needed.

## 2015-04-08 ENCOUNTER — Telehealth (HOSPITAL_COMMUNITY): Payer: Self-pay

## 2015-04-08 DIAGNOSIS — F411 Generalized anxiety disorder: Secondary | ICD-10-CM

## 2015-04-08 MED ORDER — LORAZEPAM 0.5 MG PO TABS: ORAL_TABLET | ORAL | Status: DC

## 2015-04-08 NOTE — Telephone Encounter (Signed)
Pt states Klonopin is not helping with sleep. Klonopin does help during the day with anxiety and she has taken it 1-2x over the last 2 weeks.   She is taking Xanax nightly and sleeping till 2am. States she hasn't slept will in the last 19 yrs and all the sleep aids she has tried have not worked for long.   A/P: GAD 1. D/c Klonopin. Pt will dispose of it 2. D/c Xanax. Pt will dispose of it  Pt understands the danger of mixing benzos. Writer has explained and recommended pt only take Ativan and dispose of Klonopin and Xanax. Pt has verbalized understanding and agreed to the plan.  3. Start trial of Ativan 0.5mg  po BID and 1mg  po qHS  [facility]

## 2015-04-08 NOTE — Telephone Encounter (Signed)
Medication problem - Telephone call with pt. after she left a message she has only taken Clonazepam approximately once over the past few weeks.  States it helps when needed with anxiety in the day but does nothing for sleep. States takes Xanax, 2 at night still to help with sleep and then only sleeps sound until approximately 2 in the morning.  Requests to return to taking Xanax at night order from Dr. Doyne Keel as states "I got to sleep".  Agreed to send message to Dr. Doyne Keel to help follow up this date.

## 2015-04-09 ENCOUNTER — Telehealth (HOSPITAL_COMMUNITY): Payer: Self-pay

## 2015-04-09 DIAGNOSIS — G4700 Insomnia, unspecified: Secondary | ICD-10-CM

## 2015-04-09 NOTE — Telephone Encounter (Signed)
Telephone call with patient after she left a message requesting a call back.  Patient reported speaking with Dr. Doyne Keel on 04/08/15 and was changed to Ativan.  Reports took the previous night and did not sleep at all throughout the night and also just "feels bad" today after taking them the previous night.  Patient stated " I just want to sleep" and discussed medications patient states she has tried in the past under Dr.Carey Cottle.  Patient reports having sleep problems every since her oldest son was born 84 years ago.  States she takes medication for a while for sleep and it works and then just stops working.  Patient reported her anxiety is doing okay but just wants to sleep.  Questioned if patient has ever tried Nurse, mental health Therapy for sleep as [employer], therapist offers in [place] since she reports nothing works well.  Patient reported she understands why Dr. Doyne Keel does not want her on Benzodiazepines and reports will bring ones recently prescribed in if needed.  Patient reported frustration Xanax stopped working recently as it does put her to sleep but then wakes up around 2 pm.  States this is better than not sleeping at all.  Discussed with Dr.Kynzee Devinney as patient stated she was not going to take any more Ativan due to the way it makes her feel and he approved her taking Xanax as was previously ordered just for the night this date and then to follow up with discussion with Dr.Agarwal on 04/10/15.  Agreed to inform Dr. Doyne Keel and would contact patient back on 04/10/15 for follow up and requested pt. Consider CBT for sleep hygiene as a possible helpful tool as well for improving sleep and patient agreed with plan if Dr.Agarwal felt may be helpful.

## 2015-04-10 MED ORDER — TEMAZEPAM 30 MG PO CAPS: 30.0000 mg | ORAL_CAPSULE | Freq: Every evening | ORAL | Status: DC | PRN

## 2015-04-10 NOTE — Telephone Encounter (Signed)
Pt dropped off bottle of Xanax, Klonopin and Vistaril to the clinic. Pharmacy stated per hospital policy they could not accept controlled substances for disposal. Spoke with pt and informed her that she needed to come and pick up her medication pills and destroy them on her own. Pt verbalized understanding and agreed to the plan. Medication has been left with the front desk for pt pickup.  Pt states she took a friend's Ambien last night and only slept for 3 hrs. Reports she is having issues falling and staying asleep. Pt states anxiety is high and she is taking Xanax 1/2 tab 4 times a day. Pt would like to start [place] again if her husband agrees.  A/P: GAD, MDD; Insomnia 1. D/c Xanax for sleep 2. D/c Klonopin 3. D/c Vistaril 4. D/C Ativan 4. Start trial of Restoril 30mg  po qHS prn insomnia

## 2015-04-15 ENCOUNTER — Telehealth (HOSPITAL_COMMUNITY): Payer: Self-pay

## 2015-04-15 ENCOUNTER — Encounter: Payer: Self-pay | Admitting: Gastroenterology

## 2015-04-15 NOTE — Telephone Encounter (Signed)
Met with Dr. Doyne Keel who approved setting patient back up with [place] and agreed to send to [place] coordinator to obtain prior approval and restart date.

## 2015-04-15 NOTE — Telephone Encounter (Signed)
Telephone call with patient to inform this nurse received her call with request to inform Dr. Doyne Keel she wanted to restart [place] treatments.  Informed would send request to Dr. Doyne Keel and would be back in touch with patient to set up when [place] slots available.

## 2015-04-17 ENCOUNTER — Telehealth (HOSPITAL_COMMUNITY): Payer: Self-pay

## 2015-04-17 DIAGNOSIS — F411 Generalized anxiety disorder: Secondary | ICD-10-CM

## 2015-04-17 MED ORDER — ALPRAZOLAM 1 MG PO TABS: ORAL_TABLET | ORAL | Status: DC

## 2015-04-17 NOTE — Telephone Encounter (Addendum)
Telephone call with patient after she left another message about wanting to restart [place] and needing a refill of Ativan.  Informed Dr. Doyne Keel had approved patient to restart [place] but there is currently a waiting list for [place] patients and it may be some time before she is able to start.  Agreed to request from Dr. Doyne Keel a refill of Xanax as states she took Restoril for 2 nights and "it did not work and I am just not sleeping with it".  Reports she has been taking Xanax 1 mg, 1/2 tablet 3-4 times a day for increased anxiety and would like a new order for this medication.  Agreed to send information to Dr. Doyne Keel with request and would call back once discussed.  Met with Dr. Doyne Keel to discuss patient's status with Alprazolam request and states no longer taking Restoril.  Dr. Doyne Keel authorized a new order for patient's Alprazolam 1 mg tablet, one tablet twice a day and 2 at bedtime for sleep and anxiety, #120 with no refills.  [facility] [address] with Shirlean Mylar, pharmacist and called patient to make sure she understood to discontinue her Restoril and to not be taking any other Benzodiazepines with new Xanax order and patient stated understanding.

## 2015-04-17 NOTE — Telephone Encounter (Signed)
Spoke with [employer] who will call insurance for [employer] approval.

## 2015-05-08 ENCOUNTER — Ambulatory Visit (HOSPITAL_COMMUNITY): Payer: Self-pay | Admitting: Psychiatry

## 2015-05-22 ENCOUNTER — Telehealth (HOSPITAL_COMMUNITY): Payer: Self-pay

## 2015-05-22 DIAGNOSIS — F411 Generalized anxiety disorder: Secondary | ICD-10-CM

## 2015-05-22 MED ORDER — ALPRAZOLAM 1 MG PO TABS: ORAL_TABLET | ORAL | Status: DC

## 2015-05-22 NOTE — Telephone Encounter (Signed)
Patient is calling for refill on Xanax. Last visit was 03/27/2015 and her f/u 06/03/2015. She was givin a script on 04/17/2015 for one month. Okay to refill?

## 2015-05-22 NOTE — Telephone Encounter (Signed)
Called in patient's one month refill of patient's Alprazolam 1 mg, one twice a day and 2 at bedtime, #120 with no refills to [facility] [address] with Bryson Ha, pharmacist. Called patient to inform order was called in for her this date.

## 2015-05-22 NOTE — Telephone Encounter (Signed)
Yes ok to refill xanax

## 2015-06-03 ENCOUNTER — Encounter (HOSPITAL_COMMUNITY): Payer: Self-pay | Admitting: Psychiatry

## 2015-06-03 ENCOUNTER — Ambulatory Visit (INDEPENDENT_AMBULATORY_CARE_PROVIDER_SITE_OTHER): Payer: BLUE CROSS/BLUE SHIELD | Admitting: Psychiatry

## 2015-06-03 VITALS — BP 130/84 | HR 99 | Ht 63.0 in | Wt 98.4 lb

## 2015-06-03 DIAGNOSIS — Z630 Problems in relationship with spouse or partner: Secondary | ICD-10-CM

## 2015-06-03 DIAGNOSIS — F411 Generalized anxiety disorder: Secondary | ICD-10-CM | POA: Diagnosis not present

## 2015-06-03 DIAGNOSIS — F332 Major depressive disorder, recurrent severe without psychotic features: Secondary | ICD-10-CM

## 2015-06-03 NOTE — Progress Notes (Addendum)
Patient ID: ROSELENE DEEMER, female   DOB: [birth year], 50 y.o.   MRN: [id number]  [place] [address] Progress Note  CHARLEA SICA [id number] 50 y.o.  06/03/2015 3:54 PM  Chief Complaint: i am good  History of Present Illness: Pt states she is doing much better. On a scale of 1-10 with 10 being the best she is 7/10.   Pt feels overwhelmed.   She was doing worse several weeks ago and she wanted to do [place] again but husband doesn't want to pay it.   Anxiety flares randomly.  Reports palpitations, GI upset and numb arms.    Pt doesn't feel depressed and anxiety is more concerning to her.    Pt states sleep is good. Pt is getting about 8-9 hrs/night. Energy remains low.   Appetite is a little better and she is now eating 2 small meals a day. Pt is working with her PCP regarding her continued weight loss.   Things with husband remain tense and strained.   Pt is taking herbal supplements in the morning and it is helping. Pt is taking 2.5mg   Xanax at night. Pt threw away her Ritalin.   Suicidal Ideation: No Plan Formed: No  Patient has means to carry out plan: No wants to live for her kids  Homicidal Ideation: No Plan Formed: No Patient has means to carry out plan: No  Review of Systems: Psychiatric: Agitation: Yes Hallucination: No Depressed Mood: No Insomnia: No Hypersomnia: No Altered Concentration: No Feels Worthless: Yes Grandiose Ideas: No Belief In Special Powers: No New/Increased Substance Abuse: Yes smoking 1/2 ppd. Denies illicit drug use. Drinks about once a month.  Compulsions: No  Neurologic: Headache: No Seizure: No Paresthesias: No  Review of Systems  Constitutional: Positive for weight loss. Negative for fever, chills and malaise/fatigue.  HENT: Negative for congestion, ear pain, nosebleeds, sore throat and tinnitus.   Eyes: Negative for blurred vision, double vision and pain.  Respiratory: Negative for cough, sputum production and  shortness of breath.   Cardiovascular: Positive for palpitations. Negative for chest pain and leg swelling.  Gastrointestinal: Negative for heartburn, nausea, vomiting, abdominal pain, diarrhea and constipation.  Genitourinary: Negative for dysuria, urgency, frequency, hematuria and flank pain.  Musculoskeletal: Negative for myalgias, back pain, joint pain and neck pain.  Skin: Negative for itching and rash.  Neurological: Negative for dizziness, sensory change, seizures, loss of consciousness and headaches.  Psychiatric/Behavioral: Negative for depression, suicidal ideas, hallucinations and substance abuse. The patient is nervous/anxious. The patient does not have insomnia.       Past Medical Family, Social History: married and has 3 kids. 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   reports that she has been smoking Cigarettes.  She has been smoking about 0.50 packs per day. She has never used smokeless tobacco. She reports that she drinks alcohol. She reports that she does not use illicit drugs.   Family History  Problem Relation Age of Onset  . Heart disease Mother     angioplasty  . Cancer Father     prostate  . Hypertension Father   . Depression Father   . Bipolar disorder Father   . Hypertension Maternal Grandmother   . Diabetes Maternal Grandmother     borderline  . Hypertension Paternal Grandmother   . Arthritis Paternal Grandmother   . Stroke Paternal Grandmother   . Rheum arthritis Paternal Grandfather   . Rheum arthritis Sister   . Rheum arthritis  Brother     Past Medical History  Diagnosis Date  . Galactorrhea   . Pelvic relaxation   . Anal itching   . Cystocele   . Rectocele   . Depression   . Decreased libido   . Breast nodule     left  . Pelvic pain in female   . Abdominal pain   . Breast inflammation   . Mastodynia   . Fibroadenoma   . Serotonin syndrome   . Kidney stones      Outpatient Encounter Prescriptions as of 06/03/2015   Medication Sig  . ALPRAZolam (XANAX) 1 MG tablet Take 1 tablet (1 mg total) by mouth twice a day and 2 tablets (2 mg total) at bedtime for sleep and anxiety.  . Kava, Piper methysticum, (KAVA KAVA ROOT PO) Take by mouth.  Derrek Monaco Nut POWD by Does not apply route.  Laurina Bustle Oil OIL by Does not apply route.  . Passion Flower POWD by Does not apply route.  . Creatine Monohydrate POWD Take 6 g/oz by mouth. Reported on 06/03/2015  . gabapentin (NEURONTIN) 300 MG capsule TAKE ONE CAPSULE BY MOUTH EVERY DAY AT BEDTIME (Patient not taking: Reported on 01/16/2015)  . ketorolac (TORADOL) 10 MG tablet Take 10 mg by mouth every 6 (six) hours as needed for moderate pain. Reported on 06/03/2015  . methylphenidate (RITALIN) 5 MG tablet Take 1 tablet (5 mg total) by mouth daily. (Patient not taking: Reported on 06/03/2015)  . tamsulosin (FLOMAX) 0.4 MG CAPS capsule Take 0.4 mg by mouth daily. Reported on 06/03/2015  . Vortioxetine HBr (TRINTELLIX) 5 MG TABS Take 1 tablet (5 mg total) by mouth daily. (Patient not taking: Reported on 02/13/2015)   No facility-administered encounter medications on file as of 06/03/2015.    Past Psychiatric History/Hospitalization(s): Anxiety: No Bipolar Disorder: No Depression: Yes Mania: No Psychosis: No Schizophrenia: No Personality Disorder: No Hospitalization for psychiatric illness: No History of Electroconvulsive Shock Therapy: No Prior Suicide Attempts: Yes  Physical Exam: Constitutional:  BP 130/84 mmHg  Pulse 99  Ht 5\' 3"  (1.6 m)  Wt 98 lb 6.4 oz (44.634 kg)  BMI 17.44 kg/m2  LMP 05/20/2010  General Appearance: alert, oriented, no acute distress  Musculoskeletal: Strength & Muscle Tone: within normal limits Gait & Station: normal Patient leans: straight  Mental Status Examination/Evaluation: Objective: Attitude: Calm and cooperative  Appearance: Fairly Groomed, appears to be stated age  Engineer, water::  Fair  Speech:  Clear and Coherent and Normal  Rate  Volume:  Normal  Mood:  anxious  Affect:  Congruent  Thought Process:  Goal Directed  Orientation:  Full (Time, Place, and Person)  Thought Content:  Negative  Suicidal Thoughts:  No  Homicidal Thoughts:  No  Judgement:  Intact  Insight:  Present  Concentration: good  Memory: Immediate-fair Recent-fair Remote-fair  Recall: fair  Language: fair  Gait and Station: normal  [employer] of Knowledge: average  Psychomotor Activity:  Normal  Akathisia:  No  Handed:  Right  AIMS (if indicated):  n/a   Assets:  Armed forces logistics/support/administrative officer Desire for Improvement Financial Resources/Insurance Housing Intimacy Leisure Time Physical Health Social Support Engineer, drilling (Choose Three): Established Problem, Stable/Improving (1), Review of Psycho-Social Stressors (1), Review of Last Therapy Session (1), Review of Medication Regimen & Side Effects (2) and Review of New Medication or Change in Dosage (2)  Assessment: Axis I: MDD- severe, recurrent without psychotic features; GAD; Partner  relationship problems  Axis II: deferred    Plan:  Discussed developing a daily routine and goal setting as measures for improving depression.   Therapy: brief supportive therapy provided. Discussed psychosocial stressors in detail.   Pt encouraged to continue group therapy  Continue Xanax 1mg  po BID and 2mg  po qHS prn anxiety D/c Neurontin D/c Trintillix D/c Ritalin  Medication management with supportive therapy. Risks/benefits and SE of the medication discussed. Pt verbalized understanding and verbal consent obtained for treatment.  Affirm with the patient that the medications are taken as ordered. Patient expressed understanding of how their medications were to be used.   Labs reviewed frp, 12/26/2014 BMP WNL except glu 117, CBC WNL  Pt denies SI and is at an acute low risk for suicide.Patient told to call clinic if any problems occur. Patient  advised to go to ER if they should develop SI/HI, side effects, or if symptoms worsen. Has crisis numbers to call if needed. Pt verbalized understanding.  F/up in 8 weeks or sooner if needed Charlcie Cradle, MD 06/03/2015

## 2015-06-12 ENCOUNTER — Other Ambulatory Visit: Payer: Self-pay | Admitting: Obstetrics and Gynecology

## 2015-06-17 ENCOUNTER — Telehealth (HOSPITAL_COMMUNITY): Payer: Self-pay

## 2015-06-17 NOTE — Telephone Encounter (Signed)
Patient called and states she is unable to sleep  at night. She said she is having an issue getting to sleep, and when she finally does, she can not stay asleep more than 2 hours. Patient is unsure what the problem is. She is taking the xanax and they do not seem to be working. Please review and advise, thank you

## 2015-06-17 NOTE — Telephone Encounter (Signed)
She needs to go for CBT-Insomnia. We gave her the info before.

## 2015-06-18 NOTE — Telephone Encounter (Signed)
Telephone call with patient to inform Dr. Doyne Keel did not want to change any more medications for sleep assistance as states patient has been tried on multiple medications with limited success and reiterated to patient Dr.Agarwal would like patient to try CBT for insomnia.  Patient reported she still had the information for [facility], therapist here in [place] and would look into treatment.  Patient stated understanding Dr. Havery Moros decisions and recommendations and agreed with plan at this time.

## 2015-07-01 ENCOUNTER — Telehealth (HOSPITAL_COMMUNITY): Payer: Self-pay

## 2015-07-01 DIAGNOSIS — F411 Generalized anxiety disorder: Secondary | ICD-10-CM

## 2015-07-01 MED ORDER — ALPRAZOLAM 1 MG PO TABS: ORAL_TABLET | ORAL | Status: DC

## 2015-07-01 NOTE — Telephone Encounter (Signed)
Medication refill request - Patient left a message she is in need of a new Xanax order as was last provided a 30 day supply on 05/22/15 and medication was continued at evaluation 06/03/15.  Patient's next appointment set for 08/05/15.  Called patient to inform this nurse had received her message and to verify the atient would [facility] [address] [facility].

## 2015-07-01 NOTE — Telephone Encounter (Signed)
Yes -. 30 days with 1 refill.

## 2015-07-01 NOTE — Telephone Encounter (Signed)
Called in authorized refill of patient's Alprazolam 1 mg, one tablet by mouths twice a day and 2 tablets at bedtime for sleep and anxiety, #120 with one refill per order of Dr. Doyne Keel to [place] on [facility] [address] with Bryson Ha, pharmacist.  Called patient to inform her new new order for [facility]ax [facility] plus one refill to last until she returns to see Dr. Doyne Keel again on 08/05/15.

## 2015-08-05 ENCOUNTER — Ambulatory Visit (HOSPITAL_COMMUNITY): Payer: Self-pay | Admitting: Psychiatry

## 2015-09-18 ENCOUNTER — Ambulatory Visit (INDEPENDENT_AMBULATORY_CARE_PROVIDER_SITE_OTHER): Payer: BLUE CROSS/BLUE SHIELD | Admitting: Psychiatry

## 2015-09-18 ENCOUNTER — Encounter (HOSPITAL_COMMUNITY): Payer: Self-pay | Admitting: Psychiatry

## 2015-09-18 VITALS — BP 160/100 | HR 111 | Ht 63.0 in | Wt 98.2 lb

## 2015-09-18 DIAGNOSIS — F332 Major depressive disorder, recurrent severe without psychotic features: Secondary | ICD-10-CM

## 2015-09-18 DIAGNOSIS — G4700 Insomnia, unspecified: Secondary | ICD-10-CM | POA: Diagnosis not present

## 2015-09-18 DIAGNOSIS — F411 Generalized anxiety disorder: Secondary | ICD-10-CM | POA: Diagnosis not present

## 2015-09-18 DIAGNOSIS — Z630 Problems in relationship with spouse or partner: Secondary | ICD-10-CM | POA: Diagnosis not present

## 2015-09-18 DIAGNOSIS — R45851 Suicidal ideations: Secondary | ICD-10-CM

## 2015-09-18 MED ORDER — DOXEPIN HCL 10 MG PO CAPS: 10.0000 mg | ORAL_CAPSULE | Freq: Every evening | ORAL | Status: DC | PRN

## 2015-09-18 MED ORDER — ALPRAZOLAM 0.5 MG PO TABS: 0.5000 mg | ORAL_TABLET | Freq: Three times a day (TID) | ORAL | Status: DC | PRN

## 2015-09-18 NOTE — Progress Notes (Signed)
Patient ID: [name], female   DOB: Aug 31, 1965, 50 y.o.   MRN: [id number] Patient ID: [name], female   DOB: 03-Aug-1965, 50 y.o.   MRN: [id number]  [name] [id number] Progress Note  [name] [id number] 50 y.o.  09/18/2015 8:58 AM  Chief Complaint: i am not sleeping  History of Present Illness: Pt feels overwhelmed.   She was doing worse several weeks ago and she wanted to do [place] again but husband doesn't want to pay it.   Anxiety flares randomly and is high.  Reports palpitations, GI upset and numb arms.  Pt just started Buspar this week.   Pt reports she is depressed. Her husband doesn't want her on any meds and is not supportive. He doesn't know she is here to day. Reports random crying spells, anhedonia and low motivation.  Reports random, passive SI without plan or intent. She wants to live for her children. Denies HI.   Pt states sleep is not good. Pt is taking Buspar 5mg  six times a day and Xanax. Sleep is broken. Reports sleep has been a problem for many years. She is anxious and tired.    Appetite is a little better and she is now eating 2 small meals a day. Pt is working with her PCP regarding her continued weight loss.   Things with husband remain tense and strained.   Pt is taking herbal supplements in the morning and it is helping. Pt is taking 2.5mg   Xanax at night. Pt threw away her Ritalin.   Suicidal Ideation: Yes Plan Formed: No  Patient has means to carry out plan: No wants to live for her kids  Homicidal Ideation: No Plan Formed: No Patient has means to carry out plan: No  Review of Systems: Psychiatric: Agitation: Yes Hallucination: No Depressed Mood: No Insomnia: No Hypersomnia: No Altered Concentration: No Feels Worthless: Yes Grandiose Ideas: No Belief In Special Powers: No New/Increased Substance Abuse: Yes smoking 1/2 ppd. Denies illicit drug use. Drinks about once a month.  Compulsions:  No  Neurologic: Headache: No Seizure: No Paresthesias: No  Review of Systems  Constitutional: Positive for weight loss. Negative for fever, chills and malaise/fatigue.  HENT: Negative for congestion, ear pain, nosebleeds, sore throat and tinnitus.   Eyes: Negative for blurred vision, double vision and pain.  Respiratory: Negative for cough, sputum production and shortness of breath.   Cardiovascular: Positive for palpitations. Negative for chest pain and leg swelling.  Gastrointestinal: Negative for heartburn, nausea, vomiting, abdominal pain, diarrhea and constipation.  Genitourinary: Negative for dysuria, urgency, frequency, hematuria and flank pain.  Musculoskeletal: Negative for myalgias, back pain, joint pain and neck pain.  Skin: Negative for itching and rash.  Neurological: Negative for dizziness, sensory change, seizures, loss of consciousness and headaches.  Psychiatric/Behavioral: Positive for depression and suicidal ideas. Negative for hallucinations and substance abuse. The patient is nervous/anxious and has insomnia.       Past Medical Family, Social History: married and has 3 kids. 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   reports that she has been smoking Cigarettes.  She has been smoking about 0.50 packs per day. She has never used smokeless tobacco. She reports that she drinks alcohol. She reports that she does not use illicit drugs.   Family History  Problem Relation Age of Onset  . Heart disease Mother     angioplasty  . Cancer Father     prostate  . Hypertension  Father   . Depression Father   . Bipolar disorder Father   . Hypertension Maternal Grandmother   . Diabetes Maternal Grandmother     borderline  . Hypertension Paternal Grandmother   . Arthritis Paternal Grandmother   . Stroke Paternal Grandmother   . Rheum arthritis Paternal Grandfather   . Rheum arthritis Sister   . Rheum arthritis Brother     Past Medical History   Diagnosis Date  . Galactorrhea   . Pelvic relaxation   . Anal itching   . Cystocele   . Rectocele   . Depression   . Decreased libido   . Breast nodule     left  . Pelvic pain in female   . Abdominal pain   . Breast inflammation   . Mastodynia   . Fibroadenoma   . Serotonin syndrome   . Kidney stones      Outpatient Encounter Prescriptions as of 09/18/2015  Medication Sig  . Acetylcysteine (NAC 600 PO) Take by mouth.  . ALPRAZolam (XANAX) 1 MG tablet Take 1 tablet (1 mg total) by mouth twice a day and 2 tablets (2 mg total) at bedtime for sleep and anxiety.  . busPIRone (BUSPAR) 10 MG tablet Take 10 mg by mouth 3 (three) times daily.  . Creatine Monohydrate POWD Take 6 g/oz by mouth. Reported on 06/03/2015  . Kava, Piper methysticum, (KAVA KAVA ROOT PO) Take by mouth.  Marland Kitchen ketorolac (TORADOL) 10 MG tablet Take 10 mg by mouth every 6 (six) hours as needed for moderate pain. Reported on 06/03/2015  . Kola Nut POWD by Does not apply route.  Laurina Bustle Oil OIL by Does not apply route.  . Passion Flower POWD by Does not apply route.  . tamsulosin (FLOMAX) 0.4 MG CAPS capsule Take 0.4 mg by mouth daily. Reported on 06/03/2015  . diphenhydrAMINE (SOMINEX) 25 MG tablet Take 50 mg by mouth at bedtime as needed for sleep. Reported on 09/18/2015   No facility-administered encounter medications on file as of 09/18/2015.    Past Psychiatric History/Hospitalization(s): Anxiety: No Bipolar Disorder: No Depression: Yes Mania: No Psychosis: No Schizophrenia: No Personality Disorder: No Hospitalization for psychiatric illness: No History of Electroconvulsive Shock Therapy: No Prior Suicide Attempts: Yes  Physical Exam: Constitutional:  BP 160/100 mmHg  Pulse 111  Ht 5\' 3"  (1.6 m)  Wt 98 lb 3.2 oz (44.543 kg)  BMI 17.40 kg/m2  LMP 05/20/2010  General Appearance: alert, oriented, no acute distress  Musculoskeletal: Strength & Muscle Tone: within normal limits Gait & Station:  normal Patient leans: straight  Mental Status Examination/Evaluation: Objective: Attitude: Calm and cooperative  Appearance: Fairly Groomed, appears to be stated age  Engineer, water::  Fair  Speech:  Clear and Coherent and Normal Rate  Volume:  Normal  Mood:  Anxious and depressed  Affect:  Congruent  Thought Process:  Goal Directed  Orientation:  Full (Time, Place, and Person)  Thought Content:  Negative  Suicidal Thoughts:  Yes.  without intent/plan  Homicidal Thoughts:  No  Judgement:  Intact  Insight:  Present  Concentration: good  Memory: Immediate-fair Recent-fair Remote-fair  Recall: fair  Language: fair  Gait and Station: normal  [employer] of Knowledge: average  Psychomotor Activity:  Normal  Akathisia:  No  Handed:  Right  AIMS (if indicated):  n/a   Assets:  Communication Skills Desire for Improvement Financial Resources/Insurance [employer] Solicitor Decision  Making (Choose Three): Review of Psycho-Social Stressors (1), Established Problem, Worsening (2), Review of Last Therapy Session (1), Review of Medication Regimen & Side Effects (2) and Review of New Medication or Change in Dosage (2)  Assessment: Axis I: MDD- severe, recurrent without psychotic features; GAD; Partner relationship problems; Insomnia  Axis II: deferred    Plan:  Discussed developing a daily routine and goal setting as measures for improving depression.   Therapy: brief supportive therapy provided. Discussed psychosocial stressors in detail.   Pt refused CBT-I Pt encouraged to start individual therapy  Buspar 10mg  po TID as prescribed by her PCP D/c Xanax Start trial of Doxepin 10mg  po qHS prn insomnia Pt states she can not afford [place] Pt refused treatment with antidepressants  Medication management with supportive therapy. Risks/benefits and SE of the medication discussed. Pt verbalized  understanding and verbal consent obtained for treatment.  Affirm with the patient that the medications are taken as ordered. Patient expressed understanding of how their medications were to be used.   Labs reviewed frp, 12/26/2014 BMP WNL except glu 117, CBC WNL  Pt denies SI and is at an acute low risk for suicide.Patient told to call clinic if any problems occur. Patient advised to go to ER if they should develop SI/HI, side effects, or if symptoms worsen. Has crisis numbers to call if needed. Pt verbalized understanding.  F/up in 8 weeks or sooner if needed Charlcie Cradle, MD 09/18/2015

## 2015-09-29 ENCOUNTER — Other Ambulatory Visit: Payer: Self-pay

## 2015-09-29 ENCOUNTER — Emergency Department (HOSPITAL_COMMUNITY): Payer: BLUE CROSS/BLUE SHIELD

## 2015-09-29 ENCOUNTER — Inpatient Hospital Stay (HOSPITAL_COMMUNITY)
Admission: AD | Admit: 2015-09-29 | Discharge: 2015-10-03 | DRG: 885 | Disposition: A | Discharge status: Home / Self Care | Payer: BLUE CROSS/BLUE SHIELD | Source: Intra-hospital | Attending: Psychiatry | Admitting: Psychiatry

## 2015-09-29 ENCOUNTER — Emergency Department (HOSPITAL_COMMUNITY)
Admission: EM | Admit: 2015-09-29 | Discharge: 2015-09-29 | Disposition: A | Discharge status: Other Acute Inpatient Hospital | Payer: BLUE CROSS/BLUE SHIELD | Attending: Emergency Medicine | Admitting: Emergency Medicine

## 2015-09-29 ENCOUNTER — Encounter (HOSPITAL_COMMUNITY): Payer: Self-pay

## 2015-09-29 DIAGNOSIS — F17210 Nicotine dependence, cigarettes, uncomplicated: Secondary | ICD-10-CM | POA: Diagnosis present

## 2015-09-29 DIAGNOSIS — Z818 Family history of other mental and behavioral disorders: Secondary | ICD-10-CM | POA: Diagnosis not present

## 2015-09-29 DIAGNOSIS — R079 Chest pain, unspecified: Secondary | ICD-10-CM | POA: Insufficient documentation

## 2015-09-29 DIAGNOSIS — F411 Generalized anxiety disorder: Secondary | ICD-10-CM | POA: Diagnosis present

## 2015-09-29 DIAGNOSIS — R002 Palpitations: Secondary | ICD-10-CM | POA: Insufficient documentation

## 2015-09-29 DIAGNOSIS — R45851 Suicidal ideations: Secondary | ICD-10-CM | POA: Diagnosis present

## 2015-09-29 DIAGNOSIS — F339 Major depressive disorder, recurrent, unspecified: Secondary | ICD-10-CM

## 2015-09-29 DIAGNOSIS — G4700 Insomnia, unspecified: Secondary | ICD-10-CM | POA: Diagnosis present

## 2015-09-29 DIAGNOSIS — F418 Other specified anxiety disorders: Secondary | ICD-10-CM | POA: Diagnosis not present

## 2015-09-29 DIAGNOSIS — F332 Major depressive disorder, recurrent severe without psychotic features: Principal | ICD-10-CM | POA: Diagnosis present

## 2015-09-29 HISTORY — DX: Chronic kidney disease, unspecified: N18.9

## 2015-09-29 LAB — BASIC METABOLIC PANEL
ANION GAP: 14 (ref 5–15)
BUN: 10 mg/dL (ref 6–20)
CALCIUM: 10.1 mg/dL (ref 8.9–10.3)
CO2: 23 mmol/L (ref 22–32)
Chloride: 103 mmol/L (ref 101–111)
Creatinine, Ser: 1.01 mg/dL — ABNORMAL HIGH (ref 0.44–1.00)
Glucose, Bld: 105 mg/dL — ABNORMAL HIGH (ref 65–99)
POTASSIUM: 3.5 mmol/L (ref 3.5–5.1)
Sodium: 140 mmol/L (ref 135–145)

## 2015-09-29 LAB — CBC WITH DIFFERENTIAL/PLATELET
BASOS ABS: 0 10*3/uL (ref 0.0–0.1)
BASOS PCT: 1 %
Eosinophils Absolute: 0 10*3/uL (ref 0.0–0.7)
Eosinophils Relative: 1 %
HEMATOCRIT: 50.9 % — AB (ref 36.0–46.0)
HEMOGLOBIN: 18.1 g/dL — AB (ref 12.0–15.0)
LYMPHS PCT: 18 %
Lymphs Abs: 1.5 10*3/uL (ref 0.7–4.0)
MCH: 34 pg (ref 26.0–34.0)
MCHC: 35.6 g/dL (ref 30.0–36.0)
MCV: 95.7 fL (ref 78.0–100.0)
MONO ABS: 0.6 10*3/uL (ref 0.1–1.0)
Monocytes Relative: 7 %
NEUTROS ABS: 6 10*3/uL (ref 1.7–7.7)
NEUTROS PCT: 73 %
Platelets: 205 10*3/uL (ref 150–400)
RBC: 5.32 MIL/uL — AB (ref 3.87–5.11)
RDW: 11.9 % (ref 11.5–15.5)
WBC: 8.2 10*3/uL (ref 4.0–10.5)

## 2015-09-29 LAB — I-STAT TROPONIN, ED: Troponin i, poc: 0.01 ng/mL (ref 0.00–0.08)

## 2015-09-29 MED ORDER — MAGNESIUM 200 MG PO TABS: ORAL_TABLET | Freq: Every day | ORAL | Status: DC

## 2015-09-29 MED ORDER — TRAZODONE HCL 50 MG PO TABS
50.0000 mg | ORAL_TABLET | Freq: Every evening | ORAL | Status: DC | PRN
  Administered 2015-09-29: 50 mg via ORAL
  Filled 2015-09-29: qty 1

## 2015-09-29 MED ORDER — ALPRAZOLAM 1 MG PO TABS
1.0000 mg | ORAL_TABLET | Freq: Every evening | ORAL | Status: DC | PRN
  Administered 2015-09-29: 1 mg via ORAL
  Filled 2015-09-29: qty 1

## 2015-09-29 MED ORDER — ONDANSETRON HCL 4 MG/2ML IJ SOLN: 4.0000 mg | Freq: Once | INTRAMUSCULAR | Status: DC

## 2015-09-29 MED ORDER — SODIUM CHLORIDE 0.9 % IV BOLUS (SEPSIS)
1000.0000 mL | Freq: Once | INTRAVENOUS | Status: AC
  Administered 2015-09-29: 1000 mL via INTRAVENOUS

## 2015-09-29 MED ORDER — MAGNESIUM OXIDE 400 (241.3 MG) MG PO TABS: 400.0000 mg | ORAL_TABLET | Freq: Every day | ORAL | Status: DC

## 2015-09-29 MED ORDER — IBUPROFEN 400 MG PO TABS
400.0000 mg | ORAL_TABLET | Freq: Four times a day (QID) | ORAL | Status: DC | PRN
  Administered 2015-09-29: 400 mg via ORAL
  Filled 2015-09-29: qty 1

## 2015-09-29 MED ORDER — ENSURE ENLIVE PO LIQD: 237.0000 mL | Freq: Two times a day (BID) | ORAL | Status: DC

## 2015-09-29 MED ORDER — FENTANYL CITRATE (PF) 100 MCG/2ML IJ SOLN: 50.0000 ug | Freq: Once | INTRAMUSCULAR | Status: DC

## 2015-09-29 MED ORDER — HYDROXYZINE HCL 50 MG PO TABS: 50.0000 mg | ORAL_TABLET | Freq: Once | ORAL | Status: DC

## 2015-09-29 MED ORDER — ALUM & MAG HYDROXIDE-SIMETH 200-200-20 MG/5ML PO SUSP: 30.0000 mL | ORAL | Status: DC | PRN

## 2015-09-29 MED ORDER — MAGNESIUM OXIDE 400 (241.3 MG) MG PO TABS
400.0000 mg | ORAL_TABLET | Freq: Every day | ORAL | Status: DC
  Administered 2015-09-30 – 2015-10-03 (×4): 400 mg via ORAL
  Filled 2015-09-29 (×6): qty 1

## 2015-09-29 MED ORDER — MAGNESIUM HYDROXIDE 400 MG/5ML PO SUSP: 30.0000 mL | Freq: Every day | ORAL | Status: DC | PRN

## 2015-09-29 MED ORDER — BUSPIRONE HCL 15 MG PO TABS
7.5000 mg | ORAL_TABLET | Freq: Three times a day (TID) | ORAL | Status: DC
  Administered 2015-09-30 (×2): 7.5 mg via ORAL
  Filled 2015-09-29 (×7): qty 1

## 2015-09-29 MED ORDER — BUSPIRONE HCL 15 MG PO TABS
7.5000 mg | ORAL_TABLET | Freq: Three times a day (TID) | ORAL | Status: DC
  Administered 2015-09-29: 7.5 mg via ORAL
  Filled 2015-09-29 (×2): qty 1

## 2015-09-29 MED ORDER — ALPRAZOLAM 0.25 MG PO TABS: 1.0000 mg | ORAL_TABLET | Freq: Every evening | ORAL | Status: DC | PRN

## 2015-09-29 MED ORDER — IBUPROFEN 200 MG PO TABS: 200.0000 mg | ORAL_TABLET | Freq: Four times a day (QID) | ORAL | Status: DC | PRN

## 2015-09-29 MED ORDER — ACETAMINOPHEN 325 MG PO TABS: 650.0000 mg | ORAL_TABLET | Freq: Four times a day (QID) | ORAL | Status: DC | PRN

## 2015-09-29 MED ORDER — PNEUMOCOCCAL VAC POLYVALENT 25 MCG/0.5ML IJ INJ
0.5000 mL | INJECTION | INTRAMUSCULAR | Status: AC
  Administered 2015-09-30: 0.5 mL via INTRAMUSCULAR

## 2015-09-29 MED ORDER — HYDROXYZINE HCL 25 MG PO TABS
25.0000 mg | ORAL_TABLET | Freq: Four times a day (QID) | ORAL | Status: DC | PRN
  Administered 2015-09-29 – 2015-09-30 (×2): 25 mg via ORAL
  Filled 2015-09-29 (×3): qty 1

## 2015-09-29 NOTE — ED Notes (Signed)
[name] is to be the primary and only contact for this patient. [phone]

## 2015-09-29 NOTE — Progress Notes (Signed)
   09/29/15 1500  Clinical Encounter Type  Visited With Patient  Visit Type ED  Referral From Nurse  Consult/Referral To Chaplain  Spiritual Encounters  Spiritual Needs Emotional  CHP responded to [facility] request.  Patient has great anxiety and family situation that escalates her anxiety.  Patient's good friend was present.  Husband in waiting room, but wife did not want him back in room.  CHP listened, provided support and gently probed about what she might want/need to do.  Friend was encouraging behavioral health stay.  Patient taken to x-ray.  CHP is available to follow up if requested. [name] 09/29/2015

## 2015-09-29 NOTE — Progress Notes (Signed)
Patient did not attend wrap-up group, had just arrived on the unit.

## 2015-09-29 NOTE — ED Notes (Signed)
[facility]- pt here from urgent care with c/o generalized weakness, anxiety, and palpitations. She reports insomnia, decreased PO intake. Pt is alert and oriented, vitals stable with EMS.

## 2015-09-29 NOTE — ED Notes (Signed)
Pt reports she has marital issues at home. Pt is requesting to have no other visitors besides her friend Charity fundraiser). Husband is here in the lobby. This RN discussed with nurse first that pt has requested no visitors. Nurse first made husband aware that no visitors would be allowed at this time.

## 2015-09-29 NOTE — Tx Team (Signed)
Initial Interdisciplinary Treatment Plan   PATIENT STRESSORS: Marital or family conflict Medication change or noncompliance   PATIENT STRENGTHS: Average or above average intelligence Capable of independent living Communication skills General fund of knowledge   PROBLEM LIST: Problem List/Patient Goals Date to be addressed Date deferred Reason deferred Estimated date of resolution  Anxiety 09/29/15     Depression 09/29/15     "I just want to feel better physically" 09/29/15     "Learn a new coping skill" 09/29/15                                    DISCHARGE CRITERIA:  Improved stabilization in mood, thinking, and/or behavior Verbal commitment to aftercare and medication compliance  PRELIMINARY DISCHARGE PLAN: Outpatient therapy Medication management  PATIENT/FAMIILY INVOLVEMENT: This treatment plan has been presented to and reviewed with the patient, [name].  The patient and family have been given the opportunity to ask questions and make suggestions.  Barbette Or Olen Eaves 09/29/2015, 8:58 PM

## 2015-09-29 NOTE — ED Provider Notes (Signed)
CSN: [id number]     Arrival date & time 09/29/15  1407 History   First MD Initiated Contact with Patient 09/29/15 1418     Chief Complaint  Patient presents with  . Palpitations  . Anxiety    HPI   [name] is an 50 y.o. female with history of anxiety and depression who presents to the ED for evaluation of chest pain, palpitations, and anxiety. She states that she has chronic issues with anxiety and depression that stem from her relationship with her husband at home. However, she states that since last night she has felt worse than she has had in the past. She reports diffuse anterior chest pain that she describes as a clenching, burning pain. She reports feeling like her heart is pounding out of her chest. She was seen at a [facility] urgent care earlier today and was sent to the ED due to abnormal EKG. She denies shortness of breath. She states her mouth feels very dry. She states she feels weak and tired all over. MS. Staude does endorse that these are symptoms she gets frequently when her anxiety flares up but it is not usually this bad. She states the symptoms have been constant since last night. She states she only slept two hours last night. She reports the insomnia is not new and she has been trying different medications prescribed by her PCP and psychiatrist with no improvement in her sleep. States she has had a poor appetite for over a year. She reports she has had suicidal thoughts in the past but none now. She does feel hopeless and "stuck" and does not feel safe at home. She states she is taking Buspar and xanax with no improvement in her anxiety. She denies homicidal ideations. She does not want to talk to TTS today but is amenable to speaking with social work and the chaplain. She was given 324 mg ASA at [facility]. Endorses fam hx of heart disease. She is not on any birth control (s/p hysterectomy). No longer smokes.  Past Medical History  Diagnosis Date  . Galactorrhea   .  Pelvic relaxation   . Anal itching   . Cystocele   . Rectocele   . Depression   . Decreased libido   . Breast nodule     left  . Pelvic pain in female   . Abdominal pain   . Breast inflammation   . Mastodynia   . Fibroadenoma   . Serotonin syndrome   . Kidney stones    Past Surgical History  Procedure Laterality Date  . Abdominal hysterectomy      partial  . Ovarian cyst removal      left  . Hernia repair    . Incontinence surgery     Family History  Problem Relation Age of Onset  . Heart disease Mother     angioplasty  . Cancer Father     prostate  . Hypertension Father   . Depression Father   . Bipolar disorder Father   . Hypertension Maternal Grandmother   . Diabetes Maternal Grandmother     borderline  . Hypertension Paternal Grandmother   . Arthritis Paternal Grandmother   . Stroke Paternal Grandmother   . Rheum arthritis Paternal Grandfather   . Rheum arthritis Sister   . Rheum arthritis Brother    Social History  Substance Use Topics  . Smoking status: Current Every Day Smoker -- 0.50 packs/day    Types: Cigarettes  . Smokeless tobacco:  Never Used  . Alcohol Use: 0.0 oz/week    0 Standard drinks or equivalent per week     Comment: one drink about once a month   OB History    Gravida Para Term Preterm AB TAB SAB Ectopic Multiple Living   3 3        3      Review of Systems  All other systems reviewed and are negative.     Allergies  Review of patient's allergies indicates no known allergies.  Home Medications   Prior to Admission medications   Medication Sig Start Date End Date Taking? Authorizing Provider  Acetylcysteine (NAC 600 PO) Take by mouth.    Historical Provider, MD  ALPRAZolam Duanne Moron) 0.5 MG tablet Take 1 tablet (0.5 mg total) by mouth 3 (three) times daily as needed for sleep or anxiety. 09/18/15 09/17/16  Charlcie Cradle, MD  busPIRone (BUSPAR) 10 MG tablet Take 10 mg by mouth 3 (three) times daily.    Historical Provider, MD   Creatine Monohydrate POWD Take 6 g/oz by mouth. Reported on 06/03/2015    Historical Provider, MD  doxepin (SINEQUAN) 10 MG capsule Take 1 capsule (10 mg total) by mouth at bedtime as needed. 09/18/15   Charlcie Cradle, MD  Kava, Piper methysticum, (KAVA KAVA ROOT PO) Take by mouth.    Historical Provider, MD  ketorolac (TORADOL) 10 MG tablet Take 10 mg by mouth every 6 (six) hours as needed for moderate pain. Reported on 06/03/2015 12/23/14   Historical Provider, MD  Joellyn Haff POWD by Does not apply route.    Historical Provider, MD  Lavender Oil OIL by Does not apply route.    Historical Provider, MD  Passion Flower POWD by Does not apply route.    Historical Provider, MD  tamsulosin (FLOMAX) 0.4 MG CAPS capsule Take 0.4 mg by mouth daily. Reported on 06/03/2015 12/23/14   Historical Provider, MD   BP 146/84 mmHg  Pulse 84  Temp(Src) 98.1 F (36.7 C) (Oral)  Resp 15  SpO2 98%  LMP 05/20/2010 Physical Exam  Constitutional: She is oriented to person, place, and time.  Thin, chronically ill appearing, tearful  HENT:  Right Ear: External ear normal.  Left Ear: External ear normal.  Nose: Nose normal.  Mouth/Throat: No oropharyngeal exudate.  MM dry  Eyes: Conjunctivae and EOM are normal. Pupils are equal, round, and reactive to light.  Neck: Normal range of motion. Neck supple.  Cardiovascular: Normal rate, regular rhythm, normal heart sounds and intact distal pulses.   Pulmonary/Chest: Effort normal and breath sounds normal. No respiratory distress. She has no wheezes.  Abdominal: Soft. Bowel sounds are normal. She exhibits no distension. There is no tenderness.  Musculoskeletal: She exhibits no edema.  Neurological: She is alert and oriented to person, place, and time. No cranial nerve deficit.  Skin: Skin is warm and dry.  Psychiatric: Her mood appears anxious. She exhibits a depressed mood. She expresses no homicidal and no suicidal ideation.  Nursing note and vitals reviewed.   ED  Course  Procedures (including critical care time) Labs Review Labs Reviewed  BASIC METABOLIC PANEL - Abnormal; Notable for the following:    Glucose, Bld 105 (*)    Creatinine, Ser 1.01 (*)    All other components within normal limits  CBC WITH DIFFERENTIAL/PLATELET - Abnormal; Notable for the following:    RBC 5.32 (*)    Hemoglobin 18.1 (*)    HCT 50.9 (*)    All other components within  normal limits  Randolm Idol, ED    Imaging Review Dg Chest 2 View  09/29/2015  CLINICAL DATA:  Chest pain.  Palpitations. EXAM: CHEST  2 VIEW COMPARISON:  07/03/2009 FINDINGS: The heart size and mediastinal contours are within normal limits. Both lungs are clear. The visualized skeletal structures are unremarkable. IMPRESSION: Normal exam. Electronically Signed   By: Lorriane Shire M.D.   On: 09/29/2015 16:04   I have personally reviewed and evaluated these images and lab results as part of my medical decision-making.   EKG Interpretation None      MDM   Final diagnoses:  None    Given chest pain and palpitations with EKG findings will obtain troponin, chest x-ray. CBC and BMP ordered as well. Fluids, pain meds ordered. Pt does not want any visitors including her husband. Social work and chaplain consulted. She does not want psych eval at this time. She is not actively suicidal and I do not think she warrants IVC at this time.  On repeat exam pt now states pain has resolved. Declined pain meds. She is much less anxious now. Troponin negative. Will obtain repeat EKG. Discussed with attending Dr. Lita Mains. Will call cardiology for their input regarding pt's EKG. CXR negative.  I spoke with Dr. Claiborne Billings of cardiology who reviewed pt's EKG with me. Appreciate assistance. He does note shortened PR interval, some possible LVH, and some nonspecific ST changes but with resolution of pain and initial workup doubt emergent cardiac etiology and does not feel further emergent workup indicated at this  time. I spoke with pt and she states that she does want TTS eval now as she is feeling quite hopeless and depressed. She is medically clear. Will place in psych hold and consult TTS. Dispo is pending TTS consult. If TTS does not recommend obs or inpatient I do not think pt necessitates IVC. Signed out to Dr. Sabra Heck at end of shift.  Anne Ng, PA-C 09/29/15 1712  Julianne Rice, MD 10/03/15 2159
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
## 2015-09-30 DIAGNOSIS — G4700 Insomnia, unspecified: Secondary | ICD-10-CM

## 2015-09-30 DIAGNOSIS — R45851 Suicidal ideations: Secondary | ICD-10-CM

## 2015-09-30 DIAGNOSIS — F411 Generalized anxiety disorder: Secondary | ICD-10-CM

## 2015-09-30 DIAGNOSIS — F339 Major depressive disorder, recurrent, unspecified: Secondary | ICD-10-CM

## 2015-09-30 MED ORDER — DOXEPIN HCL 25 MG PO CAPS
25.0000 mg | ORAL_CAPSULE | Freq: Every day | ORAL | Status: DC
  Administered 2015-09-30: 25 mg via ORAL
  Filled 2015-09-30 (×3): qty 1

## 2015-09-30 MED ORDER — OLANZAPINE 5 MG PO TBDP
5.0000 mg | ORAL_TABLET | Freq: Every day | ORAL | Status: DC
  Administered 2015-09-30: 5 mg via ORAL
  Filled 2015-09-30 (×3): qty 1

## 2015-09-30 MED ORDER — ENSURE ENLIVE PO LIQD
237.0000 mL | Freq: Three times a day (TID) | ORAL | Status: DC
  Administered 2015-09-30 – 2015-10-03 (×5): 237 mL via ORAL

## 2015-09-30 MED ORDER — ALPRAZOLAM 0.5 MG PO TABS
0.5000 mg | ORAL_TABLET | Freq: Three times a day (TID) | ORAL | Status: DC
  Filled 2015-09-30 (×3): qty 1

## 2015-09-30 MED ORDER — ALPRAZOLAM 0.5 MG PO TABS
0.5000 mg | ORAL_TABLET | Freq: Once | ORAL | Status: AC
  Administered 2015-09-30: 0.5 mg via ORAL

## 2015-09-30 MED ORDER — NICOTINE 14 MG/24HR TD PT24
14.0000 mg | MEDICATED_PATCH | Freq: Every day | TRANSDERMAL | Status: DC
  Administered 2015-09-30 – 2015-10-02 (×3): 14 mg via TRANSDERMAL
  Filled 2015-09-30 (×6): qty 1

## 2015-09-30 NOTE — BHH Group Notes (Signed)
Adult Psychoeducational Group Note  Date:  09/30/2015 Time:  9:46 PM  Group Topic/Focus:  Wrap-Up Group:   The focus of this group is to help patients review their daily goal of treatment and discuss progress on daily workbooks.  Participation Level:  Minimal  Participation Quality:  Appropriate  Affect:  Tearful  Cognitive:  Appropriate  Insight: Good  Engagement in Group:  Limited  Modes of Intervention:  Discussion  Additional Comments:  Pt stated this was her first day here and that it was rough.  She also expressed her anxiety was high and she wants to go home.  She stated she wants to get a good nights sleep.  She likes to be at the pool with her kids in the summer.  Victorino Sparrow A 09/30/2015, 9:46 PM
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
## 2015-09-30 NOTE — Progress Notes (Signed)
D:Patient in hte hallway on approach.  Patient appears anxious and she states, "I am ready to leave."  Patient states she does not appear to be getting the care that she needs.  Patient states she was unable to see the social worker and states she was unhappy after speaking to the doctor today.  Wrtier asked patient what could be done better but patient states she is unsure.  Patient states she is overwhelmed and hopes she will be able to get sleep tonight which she states is a problem.  Patient denies SI/HI and denies AVH. A: Staff to monitor Q 15 mins for safety.  Encouragement and support offered.  Scheduled medications administered per orders. R: Patient remains safe on the unit.  Patient attended group tonight.  Patient visible on the unit and interacting with peers.  Patient taking administered medications.

## 2015-09-30 NOTE — [facility] Group Notes (Signed)
[place] LCSW Group Therapy  09/30/2015   1:15 PM   Type of Therapy:  Group Therapy  Participation Level:  Minimal  Participation Quality:  Attentive  Affect:  Depressed and Flat  Cognitive:  Alert and Oriented  Insight:  Developing/Improving and Engaged  Engagement in Therapy:  Developing/Improving and Engaged  Modes of Intervention:  Clarification, Confrontation, Discussion, Education, Exploration, Limit-setting, Orientation, Problem-solving, Rapport Building, Art therapist, Socialization and Support  Summary of Progress/Problems: The topic for group therapy was feelings about diagnosis.  Pt actively participated in group discussion on their past and current diagnosis and how they feel towards this.  Pt also identified how society and family members judge them, based on their diagnosis as well as stereotypes and stigmas.  Patient did not engage in discussion despite CSW encouragement.   Tilden Fossa, MSW, [place] Clinical Social Worker [facility] [phone]

## 2015-09-30 NOTE — Progress Notes (Signed)
NUTRITION ASSESSMENT  Pt identified as at risk on the Malnutrition Screen Tool  INTERVENTION: 1. Supplements: Ensure Enlive po TID, each supplement provides 350 kcal and 20 grams of protein   NUTRITION DIAGNOSIS: Unintentional weight loss related to sub-optimal intake as evidenced by pt report.   Goal: Pt to meet >/= 90% of their estimated nutrition needs.  Monitor:  PO intake  Assessment:  Pt admitted with depression and anxiety. Pt reports poor appetite for the past year. Per weight history, pt has lost 21 lb since August 2016 (18% wt loss x 10.5 months, significant for time frame). Pt is underweight based on BMI. Pt has been ordered Ensure supplements, will increase to TID.   Height: Ht Readings from Last 1 Encounters:  09/29/15 5\' 3"  (1.6 m)    Weight: Wt Readings from Last 1 Encounters:  09/29/15 97 lb (43.999 kg)    Weight Hx: Wt Readings from Last 10 Encounters:  09/29/15 97 lb (43.999 kg)  09/18/15 98 lb 3.2 oz (44.543 kg)  06/03/15 98 lb 6.4 oz (44.634 kg)  03/27/15 103 lb 6.4 oz (46.902 kg)  02/13/15 105 lb 3.2 oz (47.718 kg)  01/16/15 108 lb (48.988 kg)  01/14/15 109 lb 12.8 oz (49.805 kg)  12/31/14 112 lb (50.803 kg)  12/26/14 113 lb (51.256 kg)  11/18/14 118 lb 3.2 oz (53.615 kg)    BMI:  Body mass index is 17.19 kg/(m^2). Pt meets criteria for underweight based on current BMI.  Estimated Nutritional Needs: Kcal: 25-30 kcal/kg Protein: > 1 gram protein/kg Fluid: 1 ml/kcal  Diet Order: Diet regular Room service appropriate?: Yes; Fluid consistency:: Thin Pt is also offered choice of unit snacks mid-morning and mid-afternoon.  Pt is eating as desired.   Lab results and medications reviewed.   Clayton Bibles, MS, RD, LDN Pager: [phone] After Hours Pager: [phone]

## 2015-09-30 NOTE — H&P (Signed)
Psychiatric Admission Assessment Adult  Patient Identification: [name] MRN:  [id number] Date of Evaluation:  09/30/2015 Chief Complaint:  MDD-SEVERE,RECURRENT GAD Principal Diagnosis: Major depressive disorder, recurrent, severe without psychotic features ([place]) Diagnosis:   Patient Active Problem List   Diagnosis Date Noted  . Insomnia [G47.00] 09/18/2015  . Partner relationship problems [Z63.0] 03/27/2015  . GAD (generalized anxiety disorder) [F41.1] 12/31/2014  . Major depressive disorder, recurrent, severe without psychotic features ([place]) [F33.2] 08/06/2014  . Disturbance of skin sensation [R20.9] 08/31/2012  . IRRITABLE BOWEL SYNDROME [K58.9] 10/15/2008  . DEPRESSION, MAJOR [F32.9] 06/29/2007  . GASTRITIS, HX OF [Z87.19] 12/31/2003   History of Present Illness: [name] is a 50 y.o. married female with 3 children ages 18, 14 and 68 and lives with husband and children at home. 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 Patient admitted emergently and voluntarily from [facility] emergency department for increased symptoms of depression, anxiety with panic episodes, hopelessness and thoughts about suicide. Patient reportedly called her friend who took her to the urgent care with the tarsal about dieting because of heart palpitations. Patient has abnormal EKG report resulted referring to the [facility] emergency department. Patient was seen and evaluated by ER physician and also consultation with cardiologist who ruled out acute cardiac events and recommended psychiatric evaluation and possible inpatient hospitalization for crisis stabilization and treatment of her depression, panic episode and suicidal ideation.   Patient reported she has been suffering with major depressive disorder since he was 50 years old and reportedly at the same time her dad was suffered with prostate cancer and died. Patient father was also suffered with depression  which required ECT treatments. Patient reportedly completed 36 treatments of [place] at [facility] outpatient August 2016. Patient reportedly positively responded for the treatment and recently contacted the psychiatrist for more treatments but unfortunately her husband cannot afford per the treatments and refused to pay. Patient saw her previous psychiatrist Dr. Clovis Pu who provided medication for sleep which did not work. Patient endorses ongoing emotional difficulties, frequent crying episodes, disturbed sleep, sleeps only 2-3 hours tonight and disturbed appetite which resulted lost about 30 pounds the last 6 months. Patient denies homicidal ideation, auditory/visual hallucinations, delusions and paranoia. Patient contract for safety as she wants to be there for her 3 children. Patient referenced her husband many times as the primary trigger to her ongoing anxiety and depression. Pt resumed seeing a psychiatrist that she had been seeing for 18 yrs prior, but stopped due to influence of her husband.   Associated Signs/Symptoms: Depression Symptoms:  depressed mood, anhedonia, insomnia, fatigue, feelings of worthlessness/guilt, difficulty concentrating, hopelessness, suicidal thoughts without plan, panic attacks, loss of energy/fatigue, disturbed sleep, weight loss, decreased labido, decreased appetite, (Hypo) Manic Symptoms:  Distractibility, Labiality of Mood, Anxiety Symptoms:  Excessive Worry, Social Anxiety, Psychotic Symptoms:  denied PTSD Symptoms: NA Total Time spent with patient: 1 hour  Past Psychiatric History: Patient has significant, chronic and resistant depression to multiple medications and was treated with transcranial magnetic stimulation as noted in history of present illness. Patient indicated she does not respond to antidepressant medication and benzodiazepine medication and also suffered with the serotonin syndrome in the past which is associated with antidepressant  medication and Seroquel.  Is the patient at risk to self? Yes.    Has the patient been a risk to self in the past 6 months? Yes.    Has the patient been a risk to self within the distant past? Yes.  Is the patient a risk to others? No.  Has the patient been a risk to others in the past 6 months? No.  Has the patient been a risk to others within the distant past? No.   Prior Inpatient Therapy:   Prior Outpatient Therapy:    Alcohol Screening: 1. How often do you have a drink containing alcohol?: Monthly or less 2. How many drinks containing alcohol do you have on a typical day when you are drinking?: 1 or 2 3. How often do you have six or more drinks on one occasion?: Never Preliminary Score: 0 9. Have you or someone else been injured as a result of your drinking?: No 10. Has a relative or friend or a doctor or another health worker been concerned about your drinking or suggested you cut down?: No Alcohol Use Disorder Identification Test Final Score (AUDIT): 1 Brief Intervention: AUDIT score less than 7 or less-screening does not suggest unhealthy drinking-brief intervention not indicated Substance Abuse History in the last 12 months:  No. Consequences of Substance Abuse: NA Previous Psychotropic Medications: Yes  Psychological Evaluations: Yes  Past Medical History:  Past Medical History  Diagnosis Date  . Galactorrhea   . Pelvic relaxation   . Anal itching   . Cystocele   . Rectocele   . Depression   . Decreased libido   . Breast nodule     left  . Pelvic pain in female   . Abdominal pain   . Breast inflammation   . Mastodynia   . Fibroadenoma   . Serotonin syndrome   . Chronic kidney disease     Past Surgical History  Procedure Laterality Date  . Abdominal hysterectomy      partial  . Ovarian cyst removal      left  . Hernia repair    . Incontinence surgery     Family History:  Family History  Problem Relation Age of Onset  . Heart disease Mother      angioplasty  . Cancer Father     prostate  . Hypertension Father   . Depression Father   . Bipolar disorder Father   . Hypertension Maternal Grandmother   . Diabetes Maternal Grandmother     borderline  . Hypertension Paternal Grandmother   . Arthritis Paternal Grandmother   . Stroke Paternal Grandmother   . Rheum arthritis Paternal Grandfather   . Rheum arthritis Sister   . Rheum arthritis Brother    Family Psychiatric  History: Significant for depression in her biological father who required ECT treatments Tobacco Screening: '@FLOW' [phone])::1)@ Social History:  History  Alcohol Use  . 0.6 oz/week  . 0 Standard drinks or equivalent, 1 Glasses of wine per week    Comment: one drink about once a month     History  Drug Use No    Additional Social History:      Pain Medications: see PTA meds Prescriptions: see PTA meds Over the Counter: see PTA meds History of alcohol / drug use?: No history of alcohol / drug abuse                    Allergies:  No Known Allergies Lab Results:  Results for orders placed or performed during the hospital encounter of 09/29/15 (from the past 48 hour(s))  Basic metabolic panel     Status: Abnormal   Collection Time: 09/29/15  2:41 PM  Result Value Ref Range   Sodium 140 135 - 145 mmol/L  Potassium 3.5 3.5 - 5.1 mmol/L   Chloride 103 101 - 111 mmol/L   CO2 23 22 - 32 mmol/L   Glucose, Bld 105 (H) 65 - 99 mg/dL   BUN 10 6 - 20 mg/dL   Creatinine, Ser 1.01 (H) 0.44 - 1.00 mg/dL   Calcium 10.1 8.9 - 10.3 mg/dL   GFR calc non Af Amer >60 >60 mL/min   GFR calc Af Amer >60 >60 mL/min    Comment: (NOTE) The eGFR has been calculated using the CKD EPI equation. This calculation has not been validated in all clinical situations. eGFR's persistently <60 mL/min signify possible Chronic Kidney Disease.    Anion gap 14 5 - 15  CBC with Differential     Status: Abnormal   Collection Time: 09/29/15  2:41 PM  Result Value Ref Range    WBC 8.2 4.0 - 10.5 K/uL   RBC 5.32 (H) 3.87 - 5.11 MIL/uL   Hemoglobin 18.1 (H) 12.0 - 15.0 g/dL   HCT 50.9 (H) 36.0 - 46.0 %   MCV 95.7 78.0 - 100.0 fL   MCH 34.0 26.0 - 34.0 pg   MCHC 35.6 30.0 - 36.0 g/dL   RDW 11.9 11.5 - 15.5 %   Platelets 205 150 - 400 K/uL   Neutrophils Relative % 73 %   Neutro Abs 6.0 1.7 - 7.7 K/uL   Lymphocytes Relative 18 %   Lymphs Abs 1.5 0.7 - 4.0 K/uL   Monocytes Relative 7 %   Monocytes Absolute 0.6 0.1 - 1.0 K/uL   Eosinophils Relative 1 %   Eosinophils Absolute 0.0 0.0 - 0.7 K/uL   Basophils Relative 1 %   Basophils Absolute 0.0 0.0 - 0.1 K/uL  I-stat troponin, ED     Status: None   Collection Time: 09/29/15  3:02 PM  Result Value Ref Range   Troponin i, poc 0.01 0.00 - 0.08 ng/mL   Comment 3            Comment: Due to the release kinetics of cTnI, a negative result within the first hours of the onset of symptoms does not rule out myocardial infarction with certainty. If myocardial infarction is still suspected, repeat the test at appropriate intervals.     Blood Alcohol level:  No results found for: [facility]  Metabolic Disorder Labs:  No results found for: HGBA1C, MPG No results found for: PROLACTIN No results found for: CHOL, TRIG, HDL, CHOLHDL, VLDL, LDLCALC  Current Medications: Current Facility-Administered Medications  Medication Dose Route Frequency Provider Last Rate Last Dose  . acetaminophen (TYLENOL) tablet 650 mg  650 mg Oral Q6H PRN Niel Hummer, NP      . ALPRAZolam Duanne Moron) tablet 0.5 mg  0.5 mg Oral TID Ambrose Finland, MD      . alum & mag hydroxide-simeth (MAALOX/MYLANTA) 200-200-20 MG/5ML suspension 30 mL  30 mL Oral Q4H PRN Niel Hummer, NP      . doxepin (SINEQUAN) capsule 25 mg  25 mg Oral QHS Ambrose Finland, MD      . feeding supplement (ENSURE ENLIVE) (ENSURE ENLIVE) liquid 237 mL  237 mL Oral TID BM Myer Peer Cobos, MD   237 mL at 09/30/15 0959  . hydrOXYzine (ATARAX/VISTARIL) tablet 25 mg   25 mg Oral Q6H PRN Niel Hummer, NP   25 mg at 09/30/15 1303  . ibuprofen (ADVIL,MOTRIN) tablet 400 mg  400 mg Oral Q6H PRN Niel Hummer, NP   400 mg at 09/29/15 2208  .  magnesium hydroxide (MILK OF MAGNESIA) suspension 30 mL  30 mL Oral Daily PRN Niel Hummer, NP      . magnesium oxide (MAG-OX) tablet 400 mg  400 mg Oral Daily Niel Hummer, NP   400 mg at 09/30/15 0805  . nicotine (NICODERM CQ - dosed in mg/24 hours) patch 14 mg  14 mg Transdermal Daily Myer Peer Cobos, MD   14 mg at 09/30/15 0800  . OLANZapine zydis (ZYPREXA) disintegrating tablet 5 mg  5 mg Oral QHS Ambrose Finland, MD       PTA Medications: Prescriptions prior to admission  Medication Sig Dispense Refill Last Dose  . ALPRAZolam (XANAX) 1 MG tablet Take 1 mg by mouth at bedtime as needed.   09/28/2015 at pm  . busPIRone (BUSPAR) 15 MG tablet Take 7.5 mg by mouth 3 (three) times daily.  0 09/28/2015 at pm  . ibuprofen (ADVIL,MOTRIN) 200 MG tablet Take 200-400 mg by mouth every 6 (six) hours as needed for headache.   Past Month at Unknown time  . MAGNESIUM PO Take 1-2 tablets by mouth daily.   09/29/2015 at am  . OVER THE COUNTER MEDICATION HAPPY CAMPER VITAMIN CAPSULES (Passion Flower/KAVA KAVA/Siberian Ginseng/Gotu Kola/Kola Nut/Schizandra/Wood Betony/Lavender): Take 1-2 capsules by mouth daily   09/29/2015 at am  . YUVAFEM 10 MCG TABS vaginal tablet Place 1 tablet vaginally 2 (two) times a week.  4 Past Month at Unknown time    Musculoskeletal: Strength & Muscle Tone: decreased Gait & Station: normal Patient leans: Right  Psychiatric Specialty Exam: Physical Exam  Constitutional: She is oriented to person, place, and time.  HENT:  Head: Normocephalic.  Eyes: Pupils are equal, round, and reactive to light.  Neck: Normal range of motion.  Cardiovascular: Regular rhythm.   Respiratory: Effort normal.  GI: Soft.  Musculoskeletal: Normal range of motion.  Neurological: She is alert and oriented to person,  place, and time.  Skin: Skin is warm.  Psychiatric: Her speech is normal. Judgment normal. Her mood appears anxious. She is slowed. Cognition and memory are normal. She exhibits a depressed mood. She expresses suicidal ideation.    Review of Systems  Constitutional: Positive for weight loss.  Cardiovascular: Positive for palpitations.  Gastrointestinal: Positive for nausea and abdominal pain.  Musculoskeletal: Positive for myalgias.  Neurological: Positive for dizziness, tremors, weakness and headaches.  Psychiatric/Behavioral: Positive for depression and suicidal ideas. The patient is nervous/anxious and has insomnia.     Blood pressure 129/92, pulse 86, temperature 98.4 F (36.9 C), temperature source Oral, resp. rate 18, height '5\' 3"'  (1.6 m), weight 43.999 kg (97 lb), last menstrual period 05/20/2010, SpO2 99 %.Body mass index is 17.19 kg/(m^2).  General Appearance: Guarded  Eye Contact:  Good  Speech:  Clear and Coherent and Slow  Volume:  Decreased  Mood:  Anxious, Dysphoric and Worthless  Affect:  Depressed and Tearful  Thought Process:  Coherent and Goal Directed  Orientation:  Full (Time, Place, and Person)  Thought Content:  WDL  Suicidal Thoughts:  Yes.  without intent/plan  Homicidal Thoughts:  No  Memory:  Immediate;   Good Recent;   Fair Remote;   Fair  Judgement:  Impaired  Insight:  Fair  Psychomotor Activity:  Restlessness  Concentration:  Concentration: Fair and Attention Span: Poor  Recall:  [employer] of Knowledge:  Good  Language:  Good  Akathisia:  Negative  Handed:  Right  AIMS (if indicated):     Assets:  Communication Skills  Desire for Improvement Financial Resources/Insurance Housing Leisure Time Physical Health Resilience Social Support Talents/Skills Transportation Vocational/Educational  ADL's:  Intact  Cognition:  Impaired,  Mild  Sleep:  Number of Hours: 6.75     Treatment Plan Summary: Daily contact with patient to assess and  evaluate symptoms and progress in treatment and Medication management  Observation Level/Precautions:  15 minute checks  Laboratory:  Reviewed admission labs.  Psychotherapy:  Group therapy, milieu therapy and supportive therapy   Medications:  Zyprexa zydis 5 mg at bedtime for mood, alprazolam 0.5 mg 3 times daily for panic episodes and Sinequan 25 mg at bedtime for insomnia and hydroxyzine 25 mg every 6 hours when necessary for anxiety. Discontinue BuSpar as it is not working. Patient may benefit from propranolol panic episodes not controlled with antidepressants and benzodiazepines.   Consultations:  As needed   Discharge Concerns:  Safety and decreased function at home   Estimated LOS:5-7 days   Other:  Patient benefit from outpatient psychiatric services and constant services, medically and psychiatrically stable    I certify that inpatient services furnished can reasonably be expected to improve the patient's condition.    Ambrose Finland, MD 6/27/20172:43 PM

## 2015-09-30 NOTE — Progress Notes (Signed)
D: Patient's self inventory sheet: patient has fair sleep, recieved sleep medication.poor  Appetite, low energy level, poor concentration. Rated depression 5/10, hopeless 10/10, anxiety 10/10. SI/HI/AVH: Denies all. Physical complaints are denied. Goal is "getting my anxiety poor sleep under control and going home". Plans to work on "see the doctor".   A: Medications administered, assessed medication knowledge and education given on medication regimen.  Emotional support and encouragement given patient.Pneumovax administered and patient encouraged to drink ordered Ensure.  R: Denies SI and HI , contracts for safety. Safety maintained with 15 minute checks.

## 2015-09-30 NOTE — Progress Notes (Signed)
Recreation Therapy Notes  Animal-Assisted Activity (AAA) Program Checklist/Progress Notes Patient Eligibility Criteria Checklist & Daily Group note for Rec Tx Intervention  Date: 06.27.2017 Time: 2:45pm Location: [address]    AAA/T Program Assumption of Risk Form signed by Patient/ or Parent Legal Guardian Yes  Patient is free of allergies or sever asthma Yes  Patient reports no fear of animals Yes  Patient reports no history of cruelty to animals Yes  Patient understands his/her participation is voluntary Yes  Patient washes hands before animal contact Yes  Patient washes hands after animal contact Yes  Behavioral Response: Engaged, Attentive.   Education: Contractor, Pensions consultant   Education Outcome: Acknowledges education.   Clinical Observations/Feedback: Patient interacted appropriately with therapy dog, petting him appropriately and asking appropriate questions about therapy dog and his training. Additionally patient interacted appropriately with peers during session.  Laureen Ochs Quita Mcgrory, LRT/CTRS        Briton Sellman L 09/30/2015 3:06 PM

## 2015-10-01 DIAGNOSIS — F332 Major depressive disorder, recurrent severe without psychotic features: Principal | ICD-10-CM

## 2015-10-01 MED ORDER — ALPRAZOLAM 0.5 MG PO TABS
0.5000 mg | ORAL_TABLET | Freq: Three times a day (TID) | ORAL | Status: DC | PRN
  Administered 2015-10-01: 0.5 mg via ORAL
  Filled 2015-10-01: qty 1

## 2015-10-01 MED ORDER — DOXEPIN HCL 50 MG PO CAPS
50.0000 mg | ORAL_CAPSULE | Freq: Every day | ORAL | Status: DC
  Administered 2015-10-01: 50 mg via ORAL
  Filled 2015-10-01 (×2): qty 1

## 2015-10-01 MED ORDER — OLANZAPINE 10 MG PO TBDP
10.0000 mg | ORAL_TABLET | Freq: Every day | ORAL | Status: DC
  Administered 2015-10-01: 10 mg via ORAL
  Filled 2015-10-01 (×2): qty 1

## 2015-10-01 MED ORDER — HYDROXYZINE HCL 50 MG PO TABS
50.0000 mg | ORAL_TABLET | Freq: Four times a day (QID) | ORAL | Status: DC | PRN
  Administered 2015-10-01 – 2015-10-02 (×3): 50 mg via ORAL
  Filled 2015-10-01 (×3): qty 1

## 2015-10-01 NOTE — Progress Notes (Signed)
Recreation Therapy Notes  Date: 06.28.2017 Time: 9:30am Location: 9:30am  Group Topic: Stress Management  Goal Area(s) Addresses:  Patient will actively participate in stress management techniques presented during session.   Behavioral Response: Engaged, Attentive, Appropriate   Intervention: Stress management techniques  Activity :  Deep Breathing, Progressive Body Relaxation and Progressive Body Scan. LRT provided education, instruction and demonstration on practice of Deep Breathing, Progressive Body Relaxation and Progressive Body Scan. Patient was asked to participate in technique introduced during session.   Education:  Stress Management, Discharge Planning.   Education Outcome: Acknowledges education  Clinical Observations/Feedback: Patient actively engaged in technique introduced, expressed no concerns and demonstrated ability to practice independently post d/c.   Laureen Ochs Anwyn Kriegel, LRT/CTRS        Gia Lusher L 10/01/2015 10:26 AM

## 2015-10-01 NOTE — [facility] Group Notes (Signed)
[place] LCSW Group Therapy 10/01/2015 1:15 PM  Type of Therapy: Group Therapy- Emotion Regulation  Participation Level: Minimal  Participation Quality:  Reserved  Affect: Appropriate  Cognitive: Alert and Oriented   Insight:  Unable to Assess  Engagement in Therapy: Minimal  Modes of Intervention: Clarification, Confrontation, Discussion, Education, Exploration, Limit-setting, Orientation, Problem-solving, Rapport Building, Art therapist, Socialization and Support  Summary of Progress/Problems: The topic for group today was emotional regulation. This group focused on both positive and negative emotion identification and allowed group members to process ways to identify feelings, regulate negative emotions, and find healthy ways to manage internal/external emotions. Group members were asked to reflect on a time when their reaction to an emotion led to a negative outcome and explored how alternative responses using emotion regulation would have benefited them. Group members were also asked to discuss a time when emotion regulation was utilized when a negative emotion was experienced. Pt did not participate in group discussion but was attentive to peers.   Peri Maris, [place] 10/01/2015 4:22 PM

## 2015-10-01 NOTE — Progress Notes (Signed)
Patient ID: [name], female   DOB: 1966-01-11, 50 y.o.   MRN: [id number]  Pt currently presents with a flat affect and guarded behavior. Per self inventory, pt rates depression at a 6, hopelessness 5 and anxiety 6. Pt's daily goal is to "getting my anxiety under control, figuring out a discharge plan, seeing social worker and doctor" and they intend to do so by "pursue the above." Pt reports fair sleep, a poor appetite, low energy.   Pt provided with medications per providers orders. Pt's labs and vitals were monitored throughout the day. Pt supported emotionally and encouraged to express concerns and questions. Pt educated on medications.  Pt's safety ensured with 15 minute and environmental checks. Pt currently denies SI/HI and A/V hallucinations. Pt verbally agrees to seek staff if SI/HI or A/VH occurs and to consult with staff before acting on any harmful thoughts. Will continue POC.
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
## 2015-10-01 NOTE — Progress Notes (Signed)
[facility] MD Progress Note  10/01/2015 1:48 PM [name]  MRN:  [id number] Subjective:  "My issue is sleep."   Objective:  [name] is here with chronic depression and anxiety/  She has established her mental health care with [facility] (?) and recently seeing Dr Doyne Keel [facility].  She states she has tried many psychotropics and now reluctant to start another anti depressant.  She is non disruptive to the unit.  Attending groups.  She is anxious about her medications. Principal Problem: Major depressive disorder, recurrent, severe without psychotic features ([place]) Diagnosis:   Patient Active Problem List   Diagnosis Date Noted  . Major depressive disorder, recurrent, severe without psychotic features ([place]) [F33.2] 08/06/2014    Priority: High  . Suicide ideation [R45.851]   . Recurrent major depression resistant to treatment ([place]) [F33.9]   . Insomnia [G47.00] 09/18/2015  . Partner relationship problems [Z63.0] 03/27/2015  . GAD (generalized anxiety disorder) [F41.1] 12/31/2014  . Disturbance of skin sensation [R20.9] 08/31/2012  . IRRITABLE BOWEL SYNDROME [K58.9] 10/15/2008  . DEPRESSION, MAJOR [F32.9] 06/29/2007  . GASTRITIS, HX OF [Z87.19] 12/31/2003   Total Time spent with patient: 30 minutes  Past Psychiatric History: see HPI  Past Medical History:  Past Medical History  Diagnosis Date  . Galactorrhea   . Pelvic relaxation   . Anal itching   . Cystocele   . Rectocele   . Depression   . Decreased libido   . Breast nodule     left  . Pelvic pain in female   . Abdominal pain   . Breast inflammation   . Mastodynia   . Fibroadenoma   . Serotonin syndrome   . Chronic kidney disease     Past Surgical History  Procedure Laterality Date  . Abdominal hysterectomy      partial  . Ovarian cyst removal      left  . Hernia repair    . Incontinence surgery     Family History:  Family History  Problem Relation Age of Onset  . Heart disease Mother      angioplasty  . Cancer Father     prostate  . Hypertension Father   . Depression Father   . Bipolar disorder Father   . Hypertension Maternal Grandmother   . Diabetes Maternal Grandmother     borderline  . Hypertension Paternal Grandmother   . Arthritis Paternal Grandmother   . Stroke Paternal Grandmother   . Rheum arthritis Paternal Grandfather   . Rheum arthritis Sister   . Rheum arthritis Brother    Family Psychiatric  History: see HPI Social History:  History  Alcohol Use  . 0.6 oz/week  . 0 Standard drinks or equivalent, 1 Glasses of wine per week    Comment: one drink about once a month     History  Drug Use No    Social History   Social History  . Marital Status: Married    Spouse Name: N/A  . Number of Children: 3  . Years of Education: College   Occupational History  . Secretary/administrator at [employer] History Main Topics  . Smoking status: Current Every Day Smoker -- 0.50 packs/day    Types: Cigarettes  . Smokeless tobacco: Never Used  . Alcohol Use: 0.6 oz/week    0 Standard drinks or equivalent, 1 Glasses of wine per week     Comment: one drink about once a month  .  Drug Use: No  . Sexual Activity: Yes    Birth Control/ Protection: Other-see comments     Comment: pt had hyst   Other Topics Concern  . None   Social History Narrative   Additional Social History:    Pain Medications: see PTA meds Prescriptions: see PTA meds Over the Counter: see PTA meds History of alcohol / drug use?: No history of alcohol / drug abuse  Sleep: Good  Appetite:  Good  Current Medications: Current Facility-Administered Medications  Medication Dose Route Frequency Provider Last Rate Last Dose  . acetaminophen (TYLENOL) tablet 650 mg  650 mg Oral Q6H PRN Niel Hummer, NP      . ALPRAZolam Duanne Moron) tablet 0.5 mg  0.5 mg Oral TID PRN Kerrie Buffalo, NP      . alum & mag hydroxide-simeth (MAALOX/MYLANTA) 200-200-20 MG/5ML suspension 30  mL  30 mL Oral Q4H PRN Niel Hummer, NP      . doxepin (SINEQUAN) capsule 50 mg  50 mg Oral QHS Kerrie Buffalo, NP      . feeding supplement (ENSURE ENLIVE) (ENSURE ENLIVE) liquid 237 mL  237 mL Oral TID BM Myer Peer Cobos, MD   237 mL at 10/01/15 1003  . hydrOXYzine (ATARAX/VISTARIL) tablet 50 mg  50 mg Oral Q6H PRN Kerrie Buffalo, NP      . ibuprofen (ADVIL,MOTRIN) tablet 400 mg  400 mg Oral Q6H PRN Niel Hummer, NP   400 mg at 09/29/15 2208  . magnesium hydroxide (MILK OF MAGNESIA) suspension 30 mL  30 mL Oral Daily PRN Niel Hummer, NP      . magnesium oxide (MAG-OX) tablet 400 mg  400 mg Oral Daily Niel Hummer, NP   400 mg at 10/01/15 6073  . nicotine (NICODERM CQ - dosed in mg/24 hours) patch 14 mg  14 mg Transdermal Daily Jenne Campus, MD   14 mg at 10/01/15 7106  . OLANZapine zydis (ZYPREXA) disintegrating tablet 10 mg  10 mg Oral QHS Kerrie Buffalo, NP        Lab Results:  Results for orders placed or performed during the hospital encounter of 09/29/15 (from the past 48 hour(s))  Basic metabolic panel     Status: Abnormal   Collection Time: 09/29/15  2:41 PM  Result Value Ref Range   Sodium 140 135 - 145 mmol/L   Potassium 3.5 3.5 - 5.1 mmol/L   Chloride 103 101 - 111 mmol/L   CO2 23 22 - 32 mmol/L   Glucose, Bld 105 (H) 65 - 99 mg/dL   BUN 10 6 - 20 mg/dL   Creatinine, Ser 1.01 (H) 0.44 - 1.00 mg/dL   Calcium 10.1 8.9 - 10.3 mg/dL   GFR calc non Af Amer >60 >60 mL/min   GFR calc Af Amer >60 >60 mL/min    Comment: (NOTE) The eGFR has been calculated using the CKD EPI equation. This calculation has not been validated in all clinical situations. eGFR's persistently <60 mL/min signify possible Chronic Kidney Disease.    Anion gap 14 5 - 15  CBC with Differential     Status: Abnormal   Collection Time: 09/29/15  2:41 PM  Result Value Ref Range   WBC 8.2 4.0 - 10.5 K/uL   RBC 5.32 (H) 3.87 - 5.11 MIL/uL   Hemoglobin 18.1 (H) 12.0 - 15.0 g/dL   HCT 50.9 (H) 36.0 -  46.0 %   MCV 95.7 78.0 - 100.0 fL   MCH 34.0  26.0 - 34.0 pg   MCHC 35.6 30.0 - 36.0 g/dL   RDW 11.9 11.5 - 15.5 %   Platelets 205 150 - 400 K/uL   Neutrophils Relative % 73 %   Neutro Abs 6.0 1.7 - 7.7 K/uL   Lymphocytes Relative 18 %   Lymphs Abs 1.5 0.7 - 4.0 K/uL   Monocytes Relative 7 %   Monocytes Absolute 0.6 0.1 - 1.0 K/uL   Eosinophils Relative 1 %   Eosinophils Absolute 0.0 0.0 - 0.7 K/uL   Basophils Relative 1 %   Basophils Absolute 0.0 0.0 - 0.1 K/uL  I-stat troponin, ED     Status: None   Collection Time: 09/29/15  3:02 PM  Result Value Ref Range   Troponin i, poc 0.01 0.00 - 0.08 ng/mL   Comment 3            Comment: Due to the release kinetics of cTnI, a negative result within the first hours of the onset of symptoms does not rule out myocardial infarction with certainty. If myocardial infarction is still suspected, repeat the test at appropriate intervals.     Blood Alcohol level:  No results found for: [facility]  Metabolic Disorder Labs: No results found for: HGBA1C, MPG No results found for: PROLACTIN No results found for: CHOL, TRIG, HDL, CHOLHDL, VLDL, LDLCALC  Physical Findings: AIMS: Facial and Oral Movements Muscles of Facial Expression: None, normal Lips and Perioral Area: None, normal Jaw: None, normal Tongue: None, normal,Extremity Movements Upper (arms, wrists, hands, fingers): None, normal Lower (legs, knees, ankles, toes): None, normal, Trunk Movements Neck, shoulders, hips: None, normal, Overall Severity Severity of abnormal movements (highest score from questions above): None, normal Incapacitation due to abnormal movements: None, normal Patient's awareness of abnormal movements (rate only patient's report): No Awareness, Dental Status Current problems with teeth and/or dentures?: No Does patient usually wear dentures?: No  CIWA:    COWS:     Musculoskeletal: Strength & Muscle Tone: within normal limits Gait & Station: normal Patient  leans: N/A  Psychiatric Specialty Exam: Physical Exam  ROS  Blood pressure 111/85, pulse 92, temperature 98.4 F (36.9 C), temperature source Oral, resp. rate 16, height _0  (1.6 m), weight 43.999 kg (97 lb), last menstrual period 05/20/2010, SpO2 99 %.Body mass index is 17.19 kg/(m^2).  General Appearance: Neat  Eye Contact:  Good  Speech:  Clear and Coherent  Volume:  Normal  Mood:  Anxious  Affect:  Appropriate  Thought Process:  Coherent and Goal Directed  Orientation:  Full (Time, Place, and Person)  Thought Content:  Rumination  Suicidal Thoughts:  No  Homicidal Thoughts:  No  Memory:  Immediate;   Good Recent;   Good Remote;   Good  Judgement:  Good  Insight:  Fair  Psychomotor Activity:  Normal  Concentration:  Concentration: Good and Attention Span: Good  Recall:  Good  Fund of Knowledge:  Good  Language:  Good  Akathisia:  Negative  Handed:  Right  AIMS (if indicated):     Assets:  Communication Skills Physical Health Resilience Social Support  ADL's:  Intact  Cognition:  WNL  Sleep:  Number of Hours: 6.5   Treatment Plan Summary: Review of chart, vital signs, medications, and notes.  1-Individual and group therapy  2-Medication management for depression and anxiety: Medications reviewed with the patient.  Change Alprazolam to PRN dosing.  Increase Vistaril 50 mg PRN anxiety.  Increase Zyprexa Zydis to 10 mg mood sx, increase Doxepin  to 50 mg for anxiety and insomnia. 3-Coping skills for depression, anxiety  4-Continue crisis stabilization and management  5-Address health issues--monitoring vital signs, stable  6-Treatment plan in progress to prevent relapse of depression and anxiety  Janett Labella, NP [facility] 10/01/2015, 1:48 PM

## 2015-10-01 NOTE — Tx Team (Signed)
Interdisciplinary Treatment Plan Update (Adult) Date: 10/01/2015   Date: 10/01/2015 4:27 PM  Progress in Treatment:  Attending groups: Yes  Participating in groups: Minimally Taking medication as prescribed: Yes  Tolerating medication: Yes  Family/Significant othe contact made: No, CSW to attempt to make contact with husband.  Patient understands diagnosis: Continuing to assess Discussing patient identified problems/goals with staff: Yes  Medical problems stabilized or resolved: Yes  Denies suicidal/homicidal ideation: Yes Patient has not harmed self or Others: Yes   New problem(s) identified: None identified at this time.   Discharge Plan or Barriers: Pt will return home and follow-up with outpatient resources  Additional comments:  Patient and CSW reviewed pt's identified goals and treatment plan. Patient verbalized understanding and agreed to treatment plan.   Reason for Continuation of Hospitalization:  Anxiety Depression Medication stabilization   Estimated length of stay: 1-2 days  Review of initial/current patient goals per problem list:   1.  Goal(s): Patient will participate in aftercare plan  Met:  Yes  Target date: 3-5 days from date of admission   As evidenced by: Patient will participate within aftercare plan AEB aftercare provider and housing plan at discharge being identified.   10/01/15: Pt will return home and follow-up with outpatient services.   2.  Goal (s): Patient will exhibit decreased depressive symptoms and suicidal ideations.  Met:  Yes  Target date: 3-5 days from date of admission   As evidenced by: Patient will utilize self rating of depression at 3 or below and demonstrate decreased signs of depression or be deemed stable for discharge by MD.  10/01/15: Pt rates depression at 3/10; denies SI  3.  Goal(s): Patient will demonstrate decreased signs and symptoms of anxiety.  Met:  No  Target date: 3-5 days from date of admission   As  evidenced by: Patient will utilize self rating of anxiety at 3 or below and demonstrated decreased signs of anxiety, or be deemed stable for discharge by MD  10/01/15: Pt rates anxiety at 6/10  Attendees:  Patient:    Family:    Physician: Dr. Shea Evans, MD  10/01/2015 4:27 PM  Nursing: Marcella Dubs, RN 10/01/2015 4:27 PM  Clinical Social Worker Peri Maris, [place] 10/01/2015 4:27 PM  Other: Tilden Fossa, [place] 10/01/2015 4:27 PM  Clinical:  10/01/2015 4:27 PM  Other:  10/01/2015 4:27 PM  Other:     Peri Maris, [place] Work [phone]

## 2015-10-01 NOTE — BHH Group Notes (Signed)
[facility] LCSW Aftercare Discharge Planning Group Note  10/01/2015 8:45 AM  Participation Quality: Alert, Appropriate and Oriented  Mood/Affect: Appropriate  Depression Rating: 2-3  Anxiety Rating: 5  Thoughts of Suicide: Pt denies SI/HI  Will you contract for safety? Yes  Current AVH: Pt denies  Plan for Discharge/Comments: Pt attended discharge planning group and actively participated in group. CSW discussed suicide prevention education with the group and encouraged them to discuss discharge planning and any relevant barriers. Pt reports that she would like to meet with a social worker today and discuss outpatient resources.  Transportation Means: Pt reports access to transportation  Supports: No supports mentioned at this time  Peri Maris, [place] 10/01/2015 9:52 AM

## 2015-10-02 LAB — RAPID URINE DRUG SCREEN, HOSP PERFORMED
AMPHETAMINES: NOT DETECTED
BARBITURATES: NOT DETECTED
Benzodiazepines: POSITIVE — AB
Cocaine: NOT DETECTED
OPIATES: NOT DETECTED
TETRAHYDROCANNABINOL: NOT DETECTED

## 2015-10-02 LAB — LIPID PANEL
Cholesterol: 186 mg/dL (ref 0–200)
HDL: 63 mg/dL (ref >40)
LDL CALC: 93 mg/dL (ref 0–99)
Total CHOL/HDL Ratio: 3 RATIO
Triglycerides: 148 mg/dL (ref <150)
VLDL: 30 mg/dL (ref 0–40)

## 2015-10-02 LAB — TSH: TSH: 1.983 u[IU]/mL (ref 0.350–4.500)

## 2015-10-02 LAB — GLUCOSE, CAPILLARY: GLUCOSE-CAPILLARY: 127 mg/dL — AB (ref 65–99)

## 2015-10-02 MED ORDER — TRAZODONE HCL 50 MG PO TABS
50.0000 mg | ORAL_TABLET | Freq: Every day | ORAL | Status: DC
  Filled 2015-10-02: qty 1

## 2015-10-02 MED ORDER — OLANZAPINE 5 MG PO TBDP
5.0000 mg | ORAL_TABLET | Freq: Every day | ORAL | Status: DC
  Filled 2015-10-02: qty 1

## 2015-10-02 MED ORDER — TRAZODONE HCL 50 MG PO TABS
50.0000 mg | ORAL_TABLET | Freq: Every evening | ORAL | Status: DC | PRN
  Administered 2015-10-02 (×2): 50 mg via ORAL
  Filled 2015-10-02 (×5): qty 1

## 2015-10-02 MED ORDER — DOXEPIN HCL 25 MG PO CAPS
25.0000 mg | ORAL_CAPSULE | Freq: Every day | ORAL | Status: DC
  Filled 2015-10-02: qty 1

## 2015-10-02 NOTE — Progress Notes (Signed)
Pt was getting her blood drawn and she fainted in the chair but did not fall out since staff were there to support her   She was unresponsive for 15 to 20 seconds and then came around and said she fainted and she has done that before prior to pt fainting her blood pressure was 124/78 pulse 88 sitting,  Standing 104/63 pulse 90    After passing out during her blood draw her blood pressure was 86/46 pulse 42 sitting   Her CBG was 127   She was sweaty and very weak   She was assisted to the wheel chair  Given fluids and taken to her room with instructions to call for help when she needed to get up until she felt stronger   Explained to pt she needs to eat and drink more and maybe talk to the doctor about decreasing her medication doses as the larger doses seemed to agitate the patient initially then she said she layed in the bed not asleep for the last 3 hours   Discussed an appetite stimulate with patient and encouraged her to discuss dosages and appetite stimulate with her doctor and this writer will pass on information to the oncoming nurse

## 2015-10-02 NOTE — Progress Notes (Signed)
DAR NOTE: Patient remained irritable, anxoius and depressed.  Denies pain, auditory and visual hallucinations.  Rates depression at 6, hopelessness at 6, and anxiety at 6.  Maintained on routine safety checks.  Medications given as prescribed.  Support and encouragement offered as needed.  Attended group and participated.  States goal for today is "talk to doctor."  Patient reports side effect of medication causing drowsiness and that medication is too strong for her.  Vistaril 50 mg given for complain of anxiety with good effect.

## 2015-10-02 NOTE — BHH Group Notes (Signed)
[place] LCSW Group Therapy 10/02/2015 1:15 PM Type of Therapy: Group Therapy Participation Level: Active  Participation Quality: Attentive, Sharing and Supportive  Affect: Depressed and Flat  Cognitive: Alert and Oriented  Insight: Developing/Improving and Engaged  Engagement in Therapy: Developing/Improving and Engaged  Modes of Intervention: Activity, Clarification, Confrontation, Discussion, Education, Exploration, Limit-setting, Orientation, Problem-solving, Rapport Building, Art therapist, Socialization and Support  Summary of Progress/Problems: Patient was attentive and engaged with speaker from [place]. Patient was attentive to speaker while they shared their story of dealing with mental health and overcoming it. Patient expressed interest in their programs and services and received information on their agency. Patient processed ways they can relate to the speaker.   Tilden Fossa, LCSW Clinical Social Worker [facility] [phone]

## 2015-10-02 NOTE — BHH Group Notes (Signed)

## 2015-10-02 NOTE — Progress Notes (Signed)
[facility] MD Progress Note  10/02/2015 12:41 PM [name]  MRN:  [id number] Subjective:  "I passed out earlier today.  I just don't know what medications will work.  I've been very challenging to treat. "   Objective:  [name] is here with chronic depression and anxiety/  She has established her mental health care with [facility] (?) and recently seeing Dr Doyne Keel [facility].  She states she has tried many psychotropics and now reluctant to start another anti depressant.  She is non disruptive to the unit.  Attending groups.    Patient was seen today and she remains anxious about her medications.  Per nursing notes, patient has syncopal episode of about 10 seconds and patient was hypotensive 86/46, pulse 42.    Principal Problem: Major depressive disorder, recurrent, severe without psychotic features ([place]) Diagnosis:   Patient Active Problem List   Diagnosis Date Noted  . Major depressive disorder, recurrent, severe without psychotic features ([place]) [F33.2] 08/06/2014    Priority: High  . Suicide ideation [R45.851]   . Recurrent major depression resistant to treatment ([place]) [F33.9]   . Insomnia [G47.00] 09/18/2015  . Partner relationship problems [Z63.0] 03/27/2015  . GAD (generalized anxiety disorder) [F41.1] 12/31/2014  . Disturbance of skin sensation [R20.9] 08/31/2012  . IRRITABLE BOWEL SYNDROME [K58.9] 10/15/2008  . DEPRESSION, MAJOR [F32.9] 06/29/2007  . GASTRITIS, HX OF [Z87.19] 12/31/2003   Total Time spent with patient: 30 minutes  Past Psychiatric History: see HPI  Past Medical History:  Past Medical History  Diagnosis Date  . Galactorrhea   . Pelvic relaxation   . Anal itching   . Cystocele   . Rectocele   . Depression   . Decreased libido   . Breast nodule     left  . Pelvic pain in female   . Abdominal pain   . Breast inflammation   . Mastodynia   . Fibroadenoma   . Serotonin syndrome   . Chronic kidney disease     Past Surgical  History  Procedure Laterality Date  . Abdominal hysterectomy      partial  . Ovarian cyst removal      left  . Hernia repair    . Incontinence surgery     Family History:  Family History  Problem Relation Age of Onset  . Heart disease Mother     angioplasty  . Cancer Father     prostate  . Hypertension Father   . Depression Father   . Bipolar disorder Father   . Hypertension Maternal Grandmother   . Diabetes Maternal Grandmother     borderline  . Hypertension Paternal Grandmother   . Arthritis Paternal Grandmother   . Stroke Paternal Grandmother   . Rheum arthritis Paternal Grandfather   . Rheum arthritis Sister   . Rheum arthritis Brother    Family Psychiatric  History: see HPI Social History:  History  Alcohol Use  . 0.6 oz/week  . 0 Standard drinks or equivalent, 1 Glasses of wine per week    Comment: one drink about once a month     History  Drug Use No    Social History   Social History  . Marital Status: Married    Spouse Name: N/A  . Number of Children: 3  . Years of Education: College   Occupational History  . Secretary/administrator at [employer] History Main Topics  . Smoking status: Current Every Day Smoker --  0.50 packs/day    Types: Cigarettes  . Smokeless tobacco: Never Used  . Alcohol Use: 0.6 oz/week    0 Standard drinks or equivalent, 1 Glasses of wine per week     Comment: one drink about once a month  . Drug Use: No  . Sexual Activity: Yes    Birth Control/ Protection: Other-see comments     Comment: pt had hyst   Other Topics Concern  . None   Social History Narrative   Additional Social History:    Pain Medications: see PTA meds Prescriptions: see PTA meds Over the Counter: see PTA meds History of alcohol / drug use?: No history of alcohol / drug abuse  Sleep: Good  Appetite:  Good  Current Medications: Current Facility-Administered Medications  Medication Dose Route Frequency Provider Last Rate  Last Dose  . acetaminophen (TYLENOL) tablet 650 mg  650 mg Oral Q6H PRN Niel Hummer, NP      . ALPRAZolam Duanne Moron) tablet 0.5 mg  0.5 mg Oral TID PRN Kerrie Buffalo, NP   0.5 mg at 10/01/15 2228  . alum & mag hydroxide-simeth (MAALOX/MYLANTA) 200-200-20 MG/5ML suspension 30 mL  30 mL Oral Q4H PRN Niel Hummer, NP      . feeding supplement (ENSURE ENLIVE) (ENSURE ENLIVE) liquid 237 mL  237 mL Oral TID BM Myer Peer Cobos, MD   237 mL at 10/02/15 1101  . hydrOXYzine (ATARAX/VISTARIL) tablet 50 mg  50 mg Oral Q6H PRN Kerrie Buffalo, NP   50 mg at 10/02/15 [id number]  . ibuprofen (ADVIL,MOTRIN) tablet 400 mg  400 mg Oral Q6H PRN Niel Hummer, NP   400 mg at 09/29/15 2208  . magnesium hydroxide (MILK OF MAGNESIA) suspension 30 mL  30 mL Oral Daily PRN Niel Hummer, NP      . magnesium oxide (MAG-OX) tablet 400 mg  400 mg Oral Daily Niel Hummer, NP   400 mg at 10/02/15 [id number]  . nicotine (NICODERM CQ - dosed in mg/24 hours) patch 14 mg  14 mg Transdermal Daily Jenne Campus, MD   14 mg at 10/02/15 0834  . traZODone (DESYREL) tablet 50 mg  50 mg Oral QHS Kerrie Buffalo, NP        Lab Results:  Results for orders placed or performed during the hospital encounter of 09/29/15 (from the past 48 hour(s))  Urine rapid drug screen (hosp performed)not at [facility]     Status: Abnormal   Collection Time: 10/01/15  9:30 AM  Result Value Ref Range   Opiates NONE DETECTED NONE DETECTED   Cocaine NONE DETECTED NONE DETECTED   Benzodiazepines POSITIVE (A) NONE DETECTED   Amphetamines NONE DETECTED NONE DETECTED   Tetrahydrocannabinol NONE DETECTED NONE DETECTED   Barbiturates NONE DETECTED NONE DETECTED    Comment:        DRUG SCREEN FOR MEDICAL PURPOSES ONLY.  IF CONFIRMATION IS NEEDED FOR ANY PURPOSE, NOTIFY LAB WITHIN 5 DAYS.        LOWEST DETECTABLE LIMITS FOR URINE DRUG SCREEN Drug Class       Cutoff (ng/mL) Amphetamine      1000 Barbiturate      200 Benzodiazepine   [id number] Tricyclics       [id number] Opiates           300 Cocaine          300 THC              50 Performed at [facility]   Glucose, capillary     Status: Abnormal   Collection Time: 10/02/15  6:25 AM  Result Value Ref Range   Glucose-Capillary 127 (H) 65 - 99 mg/dL  TSH     Status: None   Collection Time: 10/02/15  6:57 AM  Result Value Ref Range   TSH 1.983 0.350 - 4.500 uIU/mL    Comment: Performed at [facility]  Lipid panel     Status: None   Collection Time: 10/02/15  6:57 AM  Result Value Ref Range   Cholesterol 186 0 - 200 mg/dL   Triglycerides 148 <150 mg/dL   HDL 63 >40 mg/dL   Total CHOL/HDL Ratio 3.0 RATIO   VLDL 30 0 - 40 mg/dL   LDL Cholesterol 93 0 - 99 mg/dL    Comment:        Total Cholesterol/HDL:CHD Risk Coronary Heart Disease Risk Table                     Men   Women  1/2 Average Risk   3.4   3.3  Average Risk       5.0   4.4  2 X Average Risk   9.6   7.1  3 X Average Risk  23.4   11.0        Use the calculated Patient Ratio above and the CHD Risk Table to determine the patient's CHD Risk.        ATP III CLASSIFICATION (LDL):  <100     mg/dL   Optimal  100-129  mg/dL   Near or Above                    Optimal  130-159  mg/dL   Borderline  160-189  mg/dL   High  >190     mg/dL   Very High Performed at [facility]     Blood Alcohol level:  No results found for: [employer]  Metabolic Disorder Labs: No results found for: HGBA1C, MPG No results found for: PROLACTIN Lab Results  Component Value Date   CHOL 186 10/02/2015   TRIG 148 10/02/2015   HDL 63 10/02/2015   CHOLHDL 3.0 10/02/2015   VLDL 30 10/02/2015   LDLCALC 93 10/02/2015    Physical Findings: AIMS: Facial and Oral Movements Muscles of Facial Expression: None, normal Lips and Perioral Area: None, normal Jaw: None, normal Tongue: None, normal,Extremity Movements Upper (arms, wrists, hands, fingers): None, normal Lower (legs, knees, ankles, toes): None, normal, Trunk  Movements Neck, shoulders, hips: None, normal, Overall Severity Severity of abnormal movements (highest score from questions above): None, normal Incapacitation due to abnormal movements: None, normal Patient's awareness of abnormal movements (rate only patient's report): No Awareness, Dental Status Current problems with teeth and/or dentures?: No Does patient usually wear dentures?: No  CIWA:    COWS:     Musculoskeletal: Strength & Muscle Tone: within normal limits Gait & Station: normal Patient leans: N/A  Psychiatric Specialty Exam: Physical Exam  Psychiatric: Her mood appears anxious. Thought content is not paranoid. She does not exhibit a depressed mood. She expresses no homicidal and no suicidal ideation.    Review of Systems  Psychiatric/Behavioral: Negative for depression and suicidal ideas. The patient is nervous/anxious.   All other systems reviewed and are negative.   Blood pressure 104/63, pulse 90, temperature 98.6 F (37 C), temperature source Oral, resp. rate 16, height 5\' 3"  (1.6 m), weight 43.999 kg (  97 lb), last menstrual period 05/20/2010, SpO2 99 %.Body mass index is 17.19 kg/(m^2).  General Appearance: Neat  Eye Contact:  Good  Speech:  Clear and Coherent  Volume:  Normal  Mood:  Anxious  Affect:  Appropriate  Thought Process:  Coherent and Goal Directed  Orientation:  Full (Time, Place, and Person)  Thought Content:  Rumination  Suicidal Thoughts:  No  Homicidal Thoughts:  No  Memory:  Immediate;   Good Recent;   Good Remote;   Good  Judgement:  Good  Insight:  Fair  Psychomotor Activity:  Normal  Concentration:  Concentration: Good and Attention Span: Good  Recall:  Good  Fund of Knowledge:  Good  Language:  Good  Akathisia:  Negative  Handed:  Right  AIMS (if indicated):     Assets:  Communication Skills Physical Health Resilience Social Support  ADL's:  Intact  Cognition:  WNL  Sleep:  Number of Hours: 6.25   Treatment Plan  Summary: Review of chart, vital signs, medications, and notes.  1-Individual and group therapy  2-Medication management for depression and anxiety: Medications reviewed with the patient.  Cont. Alprazolam to PRN dosing. Increase Vistaril 50 mg PRN anxiety.  Decrease Zyprexa, Decrease Doxepin.  Start nightly Trazodone 50 mg and may repeat once  3-Coping skills for depression, anxiety  4-Continue crisis stabilization and management  5-Address health issues--monitoring vital signs, stable  6-Treatment plan in progress to prevent relapse of depression and anxiety  Janett Labella, NP [facility] 10/02/2015, 12:41 PM

## 2015-10-02 NOTE — Progress Notes (Signed)
D   Pt is very anxious and depressed   She reports she doesn't want to take any more xanax    She took all of her prescribed medications and still could not sleep    She initially refused to take the dose of xanax but then agreed after discussing with this Probation officer   She is appropriate and cooperative and interacts well with others A   Verbal support given   Discussed effects of going off of xanax completely and suddenly   Discussed weaning or detoxing off of it and encouraged her to talk to her doctor about same    Q 15 min checks R   Pt receptive to same and did agree to take the reduced dose of xanax     She remains safe presently

## 2015-10-03 MED ORDER — ZOLPIDEM TARTRATE 5 MG PO TABS: 5.0000 mg | ORAL_TABLET | Freq: Every day | ORAL | Status: DC

## 2015-10-03 MED ORDER — HYDROXYZINE HCL 50 MG PO TABS: 50.0000 mg | ORAL_TABLET | Freq: Four times a day (QID) | ORAL | Status: DC | PRN

## 2015-10-03 MED ORDER — NICOTINE 14 MG/24HR TD PT24: 14.0000 mg | MEDICATED_PATCH | Freq: Every day | TRANSDERMAL | Status: DC

## 2015-10-03 NOTE — [facility] Suicide Risk Assessment (Signed)
[place] INPATIENT:  Family/Significant Other Suicide Prevention Education  Suicide Prevention Education:  Education Completed; [name], Pt's husband [phone],  has been identified by the patient as the family member/significant other with whom the patient will be residing, and identified as the person(s) who will aid the patient in the event of a mental health crisis (suicidal ideations/suicide attempt).  With written consent from the patient, the family member/significant other has been provided the following suicide prevention education, prior to the and/or following the discharge of the patient.  The suicide prevention education provided includes the following:  Suicide risk factors  Suicide prevention and interventions  National Suicide Hotline telephone number  [facility] assessment telephone number  [facility] Emergency Assistance [facility] and/or Residential Mobile Crisis Unit telephone number  Request made of family/significant other to:  Remove weapons (e.g., guns, rifles, knives), all items previously/currently identified as safety concern.    Remove drugs/medications (over-the-counter, prescriptions, illicit drugs), all items previously/currently identified as a safety concern.  The family member/significant other verbalizes understanding of the suicide prevention education information provided.  The family member/significant other agrees to remove the items of safety concern listed above.  [name] 10/03/2015, 11:52 AM

## 2015-10-03 NOTE — Tx Team (Addendum)
Interdisciplinary Treatment Plan Update (Adult) Date: 10/03/2015   Date: 10/03/2015 10:22 AM  Progress in Treatment:  Attending groups: Yes  Participating in groups: Minimally Taking medication as prescribed: Yes  Tolerating medication: Yes  Family/Significant othe contact made: Yes with husband.  Patient understands diagnosis: Yes AEB seeking help with anxiety and depression  Discussing patient identified problems/goals with staff: Yes  Medical problems stabilized or resolved: Yes  Denies suicidal/homicidal ideation: Yes Patient has not harmed self or Others: Yes   New problem(s) identified: None identified at this time.   Discharge Plan or Barriers: Pt will return home and follow-up with outpatient resources  Additional comments:  Patient and CSW reviewed pt's identified goals and treatment plan. Patient verbalized understanding and agreed to treatment plan.   Reason for Continuation of Hospitalization:  Anxiety Depression Medication stabilization   Estimated length of stay: 0 days  Review of initial/current patient goals per problem list:   1.  Goal(s): Patient will participate in aftercare plan  Met:  Yes  Target date: 3-5 days from date of admission   As evidenced by: Patient will participate within aftercare plan AEB aftercare provider and housing plan at discharge being identified.   10/01/15: Pt will return home and follow-up with outpatient services.   2.  Goal (s): Patient will exhibit decreased depressive symptoms and suicidal ideations.  Met:  Yes  Target date: 3-5 days from date of admission   As evidenced by: Patient will utilize self rating of depression at 3 or below and demonstrate decreased signs of depression or be deemed stable for discharge by MD.  10/01/15: Pt rates depression at 3/10; denies SI  3.  Goal(s): Patient will demonstrate decreased signs and symptoms of anxiety.  Met:  Adequate for DC  Target date: 3-5 days from date of admission    As evidenced by: Patient will utilize self rating of anxiety at 3 or below and demonstrated decreased signs of anxiety, or be deemed stable for discharge by MD  10/01/15: Pt rates anxiety at 6/10 10/03/2015: MD feels that Pt's symptoms have decreased to the point that they can be managed in an outpatient setting.   Attendees:  Patient:    Family:    Physician: Dr. Eappen, MD  10/03/2015 10:22 AM  Nursing: Penny Carter, RN; Angie, RN 10/03/2015 10:22 AM  Clinical Social Worker  Carter, LCSWA 10/03/2015 10:22 AM  Other: Kristin Drinkard, LCSWA 10/03/2015 10:22 AM  Clinical:  10/03/2015 10:22 AM  Other:  10/03/2015 10:22 AM  Other:      Carter, LCSWA Clinical Social Work [phone]      

## 2015-10-03 NOTE — Progress Notes (Signed)
  [facility] Adult Case Management Discharge Plan :  Will you be returning to the same living situation after discharge:  Yes,  Pt returning home but will stay with friend temporarily  At discharge, do you have transportation home?: Yes,  Pt friend to pick up Do you have the ability to pay for your medications: Yes,  Pt provided with prescriptions  Release of information consent forms completed and in the chart;  Patient's signature needed at discharge.  Patient to Follow up at: Follow-up Information    Follow up with [facility]  On 10/15/2015.   Why:  at 3:15pm with Dr. Clovis Pu for medication management.   Contact information:   [address], [place] [address] Phone [phone] Fax [phone]       Follow up with [facility] On 10/08/2015.   Why:  at 12:00pm for therapy.   Contact information:   [address] [place], [place], [place] [address]  P: [phone]        Next level of care provider has access to [facility] and Suicide Prevention discussed: Yes,  with husband; see SPE note  Have you used any form of tobacco in the last 30 days? (Cigarettes, Smokeless Tobacco, Cigars, and/or Pipes): Yes  Has patient been referred to the Quitline?: Patient refused referral  Patient has been referred for addiction treatment: N/A  Bo Mcclintock 10/03/2015, 10:27 AM

## 2015-10-03 NOTE — Progress Notes (Signed)
[name] was D/C from the unit to lobby accompanied by friends.  She was pleasant and cooperative. She voiced no SI/HI or A/V halluciations.  She denies any pain or discomfort.  D/C instructions and medications reviewed with pt.  Pt. verbalized understanding of medications and d/c instructions.   All belongings from locker # 13 returned to pt. Q 15 min checks maintained until discharge.  She left the unit in no apparent distress.

## 2015-10-03 NOTE — Discharge Summary (Signed)
Physician Discharge Summary Note  Patient:  [name] is an 50 y.o., female MRN:  [id number] DOB:  06/06/1965 Patient phone:  [phone] (home)  Patient address:   [place] [address],  Total Time spent with patient: 30 minutes  Date of Admission:  09/29/2015 Date of Discharge: 10/03/2015  Reason for Admission:  Increased depression  Principal Problem: Major depressive disorder, recurrent, severe without psychotic features [facility]) Discharge Diagnoses: Patient Active Problem List   Diagnosis Date Noted  . Major depressive disorder, recurrent, severe without psychotic features ([place]) [F33.2] 08/06/2014    Priority: High  . Insomnia [G47.00] 09/18/2015  . Partner relationship problems [Z63.0] 03/27/2015  . GAD (generalized anxiety disorder) [F41.1] 12/31/2014  . Disturbance of skin sensation [R20.9] 08/31/2012  . IRRITABLE BOWEL SYNDROME [K58.9] 10/15/2008  . DEPRESSION, MAJOR [F32.9] 06/29/2007  . GASTRITIS, HX OF [Z87.19] 12/31/2003    Past Psychiatric History: see HPI  Past Medical History:  Past Medical History  Diagnosis Date  . Galactorrhea   . Pelvic relaxation   . Anal itching   . Cystocele   . Rectocele   . Depression   . Decreased libido   . Breast nodule     left  . Pelvic pain in female   . Abdominal pain   . Breast inflammation   . Mastodynia   . Fibroadenoma   . Serotonin syndrome   . Chronic kidney disease     Past Surgical History  Procedure Laterality Date  . Abdominal hysterectomy      partial  . Ovarian cyst removal      left  . Hernia repair    . Incontinence surgery     Family History:  Family History  Problem Relation Age of Onset  . Heart disease Mother     angioplasty  . Cancer Father     prostate  . Hypertension Father   . Depression Father   . Bipolar disorder Father   . Hypertension Maternal Grandmother   . Diabetes Maternal Grandmother     borderline  . Hypertension Paternal Grandmother   .  Arthritis Paternal Grandmother   . Stroke Paternal Grandmother   . Rheum arthritis Paternal Grandfather   . Rheum arthritis Sister   . Rheum arthritis Brother    Family Psychiatric  History:  see HPI Social History:  History  Alcohol Use  . 0.6 oz/week  . 0 Standard drinks or equivalent, 1 Glasses of wine per week    Comment: one drink about once a month     History  Drug Use No    Social History   Social History  . Marital Status: Married    Spouse Name: N/A  . Number of Children: 3  . Years of Education: College   Occupational History  . Secretary/administrator at [employer] History Main Topics  . Smoking status: Current Every Day Smoker -- 0.50 packs/day    Types: Cigarettes  . Smokeless tobacco: Never Used  . Alcohol Use: 0.6 oz/week    0 Standard drinks or equivalent, 1 Glasses of wine per week     Comment: one drink about once a month  . Drug Use: No  . Sexual Activity: Yes    Birth Control/ Protection: Other-see comments     Comment: pt had hyst   Other Topics Concern  . None   Social History Narrative   Hospital Course:   [name] was admitted  for Major depressive disorder, recurrent, severe without psychotic features ([place]) and crisis management.  She was treated with meds listed below .  Medical problems were identified and treated as needed.  Home medications were restarted as appropriate.  Improvement was monitored by observation and [name] daily report of symptom reduction.  Emotional and mental status was monitored by daily self inventory reports completed by [name] and clinical staff.  Patient reported continued improvement, denied any new concerns.  Patient had been compliant on medications and denied side effects.  Support and encouragement was provided.    At time of discharge, patient rated both depression and anxiety levels to be manageable and minimal.  Patient encouraged to attend  groups to help with recognizing triggers of emotional crises and de-stabilizations.  Patient encouraged to attend group to help identify the positive things in life that would help in dealing with feelings of loss, depression and unhealthy or abusive tendencies.         [name] was evaluated by the treatment team for stability and plans for continued recovery upon discharge.  She was offered further treatment options upon discharge including Residential, Intensive Outpatient and Outpatient treatment. She will follow up with agencies listed for medication management and counseling.  Encouraged patient to maintain satisfactory support network and home environment.  Advised to adhere to medication compliance and outpatient treatment follow up.  Prescriptions provided.       [name] motivation was an integral factor for scheduling further treatment.  Employment, transportation, bed availability, health status, family support, and any pending legal issues were also considered during her hospital stay.  Upon completion of this admission the patient was both mentally and medically stable for discharge denying suicidal/homicidal ideation, auditory/visual/tactile hallucinations, delusional thoughts and paranoia.      Physical Findings: AIMS: Facial and Oral Movements Muscles of Facial Expression: None, normal Lips and Perioral Area: None, normal Jaw: None, normal Tongue: None, normal,Extremity Movements Upper (arms, wrists, hands, fingers): None, normal Lower (legs, knees, ankles, toes): None, normal, Trunk Movements Neck, shoulders, hips: None, normal, Overall Severity Severity of abnormal movements (highest score from questions above): None, normal Incapacitation due to abnormal movements: None, normal Patient's awareness of abnormal movements (rate only patient's report): No Awareness, Dental Status Current problems with teeth and/or dentures?: No Does patient usually  wear dentures?: No  CIWA:    COWS:     Musculoskeletal: Strength & Muscle Tone: within normal limits Gait & Station: normal Patient leans: N/A  Psychiatric Specialty Exam:  See MD SRA Physical Exam  Vitals reviewed.   ROS  Blood pressure 141/84, pulse 103, temperature 98.3 F (36.8 C), temperature source Oral, resp. rate 18, height 5\' 3"  (1.6 m), weight 43.999 kg (97 lb), last menstrual period 05/20/2010, SpO2 99 %.Body mass index is 17.19 kg/(m^2).    Have you used any form of tobacco in the last 30 days? (Cigarettes, Smokeless Tobacco, Cigars, and/or Pipes): Yes  Has this patient used any form of tobacco in the last 30 days? (Cigarettes, Smokeless Tobacco, Cigars, and/or Pipes) Yes, NA  Blood Alcohol level:  No results found for: [facility]  Metabolic Disorder Labs:  No results found for: HGBA1C, MPG No results found for: PROLACTIN Lab Results  Component Value Date   CHOL 186 10/02/2015   TRIG 148 10/02/2015   HDL 63 10/02/2015   CHOLHDL 3.0 10/02/2015   VLDL 30 10/02/2015   LDLCALC 93 10/02/2015    See Psychiatric Specialty Exam  and Suicide Risk Assessment completed by Attending Physician prior to discharge.  Discharge destination:  Home  Is patient on multiple antipsychotic therapies at discharge:  No   Has Patient had three or more failed trials of antipsychotic monotherapy by history:  No  Recommended Plan for Multiple Antipsychotic Therapies: NA     Medication List    STOP taking these medications        ALPRAZolam 1 MG tablet  Commonly known as:  XANAX     busPIRone 15 MG tablet  Commonly known as:  BUSPAR     doxepin 10 MG capsule  Commonly known as:  SINEQUAN     ibuprofen 200 MG tablet  Commonly known as:  ADVIL,MOTRIN     MAGNESIUM PO     OVER THE COUNTER MEDICATION     YUVAFEM 10 MCG Tabs vaginal tablet  Generic drug:  Estradiol      TAKE these medications      Indication   hydrOXYzine 50 MG tablet  Commonly known as:  ATARAX/VISTARIL   Take 1 tablet (50 mg total) by mouth every 6 (six) hours as needed for anxiety.   Indication:  Anxiety Neurosis     nicotine 14 mg/24hr patch  Commonly known as:  NICODERM CQ - dosed in mg/24 hours  Place 1 patch (14 mg total) onto the skin daily.   Indication:  Nicotine Addiction     zolpidem 5 MG tablet  Commonly known as:  AMBIEN  Take 1 tablet (5 mg total) by mouth at bedtime.   Indication:  Trouble Sleeping           Follow-up Information    Follow up with [facility]  On 10/15/2015.   Why:  at 3:15pm with Dr. Clovis Pu for medication management.   Contact information:   [address], [address] [place] [address] Phone [phone] Fax [phone]       Follow up with [facility] On 10/08/2015.   Why:  at 12:00pm for therapy.   Contact information:   [address] [place], [place], [place] [address]  P: [phone]        Follow-up recommendations:  Activity:  as tol Diet:  as tol  Comments:  1.  Take all your medications as prescribed.   2.  Report any adverse side effects to outpatient provider. 3.  Patient instructed to not use alcohol or illegal drugs while on prescription medicines. 4.  In the event of worsening symptoms, instructed patient to call 911, the crisis hotline or go to nearest emergency room for evaluation of symptoms.  Signed: Janett Labella, NP [facility] 10/03/2015, 4:11 PM

## 2015-10-03 NOTE — Progress Notes (Signed)
Nutrition Education Note  Pt attended group focusing on general, healthful nutrition education.  RD emphasized the importance of eating regular meals and snacks throughout the day. Consuming sugar-free beverages and incorporating fruits and vegetables into diet when possible. Provided examples of healthy snacks. Patient encouraged to leave group with a goal to improve nutrition/healthy eating.   Diet Order: Diet regular Room service appropriate?: Yes; Fluid consistency:: Thin Pt is also offered choice of unit snacks mid-morning and mid-afternoon.  Pt is eating as desired.   If additional nutrition issues arise, please consult RD.    Jarome Matin, MS, RD, LDN Inpatient Clinical Dietitian Pager # [phone] After hours/weekend pager # [phone]

## 2015-10-03 NOTE — BHH Suicide Risk Assessment (Addendum)
[facility] Discharge Suicide Risk Assessment   Principal Problem: Major depressive disorder, recurrent, severe without psychotic features [facility]) Discharge Diagnoses:  Patient Active Problem List   Diagnosis Date Noted  . Insomnia [G47.00] 09/18/2015  . Partner relationship problems [Z63.0] 03/27/2015  . GAD (generalized anxiety disorder) [F41.1] 12/31/2014  . Major depressive disorder, recurrent, severe without psychotic features ([place]) [F33.2] 08/06/2014  . Disturbance of skin sensation [R20.9] 08/31/2012  . IRRITABLE BOWEL SYNDROME [K58.9] 10/15/2008  . DEPRESSION, MAJOR [F32.9] 06/29/2007  . GASTRITIS, HX OF [Z87.19] 12/31/2003    Total Time spent with patient: 30 minutes  Musculoskeletal: Strength & Muscle Tone: within normal limits Gait & Station: normal Patient leans: N/A  Psychiatric Specialty Exam: Review of Systems  Psychiatric/Behavioral: Positive for depression (improving). Negative for suicidal ideas.  All other systems reviewed and are negative.   Blood pressure 141/84, pulse 103, temperature 98.3 F (36.8 C), temperature source Oral, resp. rate 18, height 5\' 3"  (1.6 m), weight 43.999 kg (97 lb), last menstrual period 05/20/2010, SpO2 99 %.Body mass index is 17.19 kg/(m^2).  General Appearance: Casual  Eye Contact::  Fair  Speech:  Clear and Coherent409  Volume:  Normal  Mood:  Depressed improving  Affect:  Appropriate  Thought Process:  Goal Directed and Descriptions of Associations: Intact  Orientation:  Full (Time, Place, and Person)  Thought Content:  Logical  Suicidal Thoughts:  No  Homicidal Thoughts:  No  Memory:  Immediate;   Fair Recent;   Fair Remote;   Fair  Judgement:  Fair  Insight:  Fair  Psychomotor Activity:  Normal  Concentration:  Fair  Recall:  [employer] of Knowledge:Fair  Language: Fair  Akathisia:  No  Handed:  Right  AIMS (if indicated):     Assets:  Desire for Improvement  Sleep:  Number of Hours: 6.75  Cognition: WNL  ADL's:   Intact   Mental Status Per Nursing Assessment::   On Admission:  NA  Demographic Factors:  Caucasian  Loss Factors: NA  Historical Factors: Impulsivity  Risk Reduction Factors:   Positive social support  Continued Clinical Symptoms:  Previous Psychiatric Diagnoses and Treatments  Cognitive Features That Contribute To Risk:  Polarized thinking    Suicide Risk:  Minimal: No identifiable suicidal ideation.  Patients presenting with no risk factors but with morbid ruminations; may be classified as minimal risk based on the severity of the depressive symptoms  Follow-up Information    Follow up with [facility] On 11/20/2015.   Why:  Medication management appt with Dr. Doyne Keel on Thursday Aug. 17th at 3:15pm. Call office if you need to reschedule.    Contact information:   [address] Dr. Lady Gary Triplett [phone]      Plan Of Care/Follow-up recommendations:  Activity:  no restrictions Diet:  regular Tests:  as needed Other:  follow up with PMD for medical issues as well as out patient provider for depression  Fintan Grater, MD 10/03/2015, 10:17 AM

## 2015-10-03 NOTE — BHH Group Notes (Signed)
Patient attend group her day was a 7 her goal was to make it thru the day.

## 2015-10-10 ENCOUNTER — Encounter (HOSPITAL_COMMUNITY): Payer: Self-pay | Admitting: Emergency Medicine

## 2015-10-10 ENCOUNTER — Emergency Department (HOSPITAL_COMMUNITY)
Admission: EM | Admit: 2015-10-10 | Discharge: 2015-10-10 | Disposition: A | Discharge status: Home / Self Care | Payer: BLUE CROSS/BLUE SHIELD | Attending: Emergency Medicine | Admitting: Emergency Medicine

## 2015-10-10 DIAGNOSIS — F419 Anxiety disorder, unspecified: Secondary | ICD-10-CM | POA: Insufficient documentation

## 2015-10-10 DIAGNOSIS — F17210 Nicotine dependence, cigarettes, uncomplicated: Secondary | ICD-10-CM | POA: Diagnosis not present

## 2015-10-10 DIAGNOSIS — Z79899 Other long term (current) drug therapy: Secondary | ICD-10-CM | POA: Diagnosis not present

## 2015-10-10 DIAGNOSIS — G479 Sleep disorder, unspecified: Secondary | ICD-10-CM | POA: Insufficient documentation

## 2015-10-10 DIAGNOSIS — N189 Chronic kidney disease, unspecified: Secondary | ICD-10-CM | POA: Diagnosis not present

## 2015-10-10 DIAGNOSIS — F329 Major depressive disorder, single episode, unspecified: Secondary | ICD-10-CM | POA: Insufficient documentation

## 2015-10-10 DIAGNOSIS — R630 Anorexia: Secondary | ICD-10-CM | POA: Insufficient documentation

## 2015-10-10 LAB — RAPID URINE DRUG SCREEN, HOSP PERFORMED
Amphetamines: NOT DETECTED
BENZODIAZEPINES: NOT DETECTED
Barbiturates: NOT DETECTED
COCAINE: NOT DETECTED
OPIATES: NOT DETECTED
TETRAHYDROCANNABINOL: NOT DETECTED

## 2015-10-10 LAB — COMPREHENSIVE METABOLIC PANEL
ALK PHOS: 60 U/L (ref 38–126)
ALT: 21 U/L (ref 14–54)
ANION GAP: 7 (ref 5–15)
AST: 26 U/L (ref 15–41)
Albumin: 4.5 g/dL (ref 3.5–5.0)
BILIRUBIN TOTAL: 0.9 mg/dL (ref 0.3–1.2)
BUN: 11 mg/dL (ref 6–20)
CALCIUM: 9.4 mg/dL (ref 8.9–10.3)
CO2: 31 mmol/L (ref 22–32)
CREATININE: 0.84 mg/dL (ref 0.44–1.00)
Chloride: 102 mmol/L (ref 101–111)
Glucose, Bld: 102 mg/dL — ABNORMAL HIGH (ref 65–99)
Potassium: 3.6 mmol/L (ref 3.5–5.1)
SODIUM: 140 mmol/L (ref 135–145)
TOTAL PROTEIN: 6.6 g/dL (ref 6.5–8.1)

## 2015-10-10 LAB — CBC WITH DIFFERENTIAL/PLATELET
BASOS ABS: 0 10*3/uL (ref 0.0–0.1)
BASOS PCT: 0 %
EOS ABS: 0 10*3/uL (ref 0.0–0.7)
Eosinophils Relative: 0 %
HEMATOCRIT: 45.4 % (ref 36.0–46.0)
HEMOGLOBIN: 16.3 g/dL — AB (ref 12.0–15.0)
Lymphocytes Relative: 16 %
Lymphs Abs: 1.2 10*3/uL (ref 0.7–4.0)
MCH: 33.5 pg (ref 26.0–34.0)
MCHC: 35.9 g/dL (ref 30.0–36.0)
MCV: 93.2 fL (ref 78.0–100.0)
MONOS PCT: 6 %
Monocytes Absolute: 0.5 10*3/uL (ref 0.1–1.0)
NEUTROS ABS: 5.9 10*3/uL (ref 1.7–7.7)
NEUTROS PCT: 78 %
Platelets: 204 10*3/uL (ref 150–400)
RBC: 4.87 MIL/uL (ref 3.87–5.11)
RDW: 11.6 % (ref 11.5–15.5)
WBC: 7.6 10*3/uL (ref 4.0–10.5)

## 2015-10-10 LAB — URINALYSIS, ROUTINE W REFLEX MICROSCOPIC
BILIRUBIN URINE: NEGATIVE
Glucose, UA: NEGATIVE mg/dL
Hgb urine dipstick: NEGATIVE
KETONES UR: NEGATIVE mg/dL
LEUKOCYTES UA: NEGATIVE
NITRITE: NEGATIVE
Protein, ur: NEGATIVE mg/dL
SPECIFIC GRAVITY, URINE: 1.007 (ref 1.005–1.030)
pH: 7 (ref 5.0–8.0)

## 2015-10-10 MED ORDER — SODIUM CHLORIDE 0.9 % IV BOLUS (SEPSIS)
1000.0000 mL | Freq: Once | INTRAVENOUS | Status: AC
  Administered 2015-10-10: 1000 mL via INTRAVENOUS

## 2015-10-10 MED ORDER — MEGESTROL ACETATE 40 MG/ML PO SUSP: 400.0000 mg | Freq: Every day | ORAL | Status: DC

## 2015-10-10 NOTE — ED Notes (Addendum)
Pt c/o anxiety, depression, weight loss due to no appetite. Pt was just at [facility] for anxiety. Pt states she just doesn't feel good and believes she is dehydrated. Pt states she does not want to return to [facility] and her husband states he would like to get more instructions on how he can help his wife at home with the weight loss. Pt denies SI/HI upon assessment.

## 2015-10-10 NOTE — ED Provider Notes (Signed)
Patient seen/examined in the Emergency Department in conjunction with Midlevel Provider  Patient reports fatigue, decreased appetite and insomnia.  This has been a chronic problem Exam : awake/alert, flat affect but no distress noted Plan: labs pending at this time. She feels depressed but no SI reported   Ripley Fraise, MD 10/10/15 1151

## 2015-10-10 NOTE — Discharge Instructions (Signed)
It was my pleasure taking care of you today!   Please keep your scheduled appointment with your primary care provider on Monday. Take megace as directed to help stimulate your appetite. Please let your primary care physician know you are trying this medication and inform them if it is helping or not. Also let them know that we ran your labs (CBC, CMP) and urine which were very reassuring today! Return to ER for any new or worsening symptoms, any additional concerns.

## 2015-10-10 NOTE — ED Provider Notes (Signed)
CSN: [id number]     Arrival date & time 10/10/15  [id number] History   First MD Initiated Contact with Patient 10/10/15 1014     Chief Complaint  Patient presents with  . Anxiety  . Weight Loss    (Consider location/radiation/quality/duration/timing/severity/associated sxs/prior Treatment) Patient is a 50 y.o. female presenting with anxiety.  Anxiety Associated symptoms include fatigue. Pertinent negatives include no abdominal pain, congestion, coughing, fever, headaches, nausea, neck pain, rash or vomiting.   [name] is a 50 y.o. female  with a PMH of anxiety/depression, prior serotonin syndrome who presents to the Emergency Department with husband and daughter for decreased appetite along with worsening decreased mood and feeling "in constant panic". Patient was seen in ED with anxiety/depression on 6/26 and admitted to behavioral health. Patient states it was a very bad experience and she does not want to been seen by them again. Her main concern at this time is decreased appetite and not sleeping well. She has been sleeping a few hours each night despite multiple doses of her home ambien each night. She denies SI/HI and auditory/visual hallucinations. Admits to a few loose stools over the last week, but believes that is more related to her decreased PO intake. She endorses dry mouth worsening over the last few days. Denies chest pain, sob, abdominal pain, constipation or any additional symptoms. Followed by PCP with appointment on Monday (3 days).    Past Medical History  Diagnosis Date  . Galactorrhea   . Pelvic relaxation   . Anal itching   . Cystocele   . Rectocele   . Depression   . Decreased libido   . Breast nodule     left  . Pelvic pain in female   . Abdominal pain   . Breast inflammation   . Mastodynia   . Fibroadenoma   . Serotonin syndrome   . Chronic kidney disease    Past Surgical History  Procedure Laterality Date  . Abdominal hysterectomy       partial  . Ovarian cyst removal      left  . Hernia repair    . Incontinence surgery     Family History  Problem Relation Age of Onset  . Heart disease Mother     angioplasty  . Cancer Father     prostate  . Hypertension Father   . Depression Father   . Bipolar disorder Father   . Hypertension Maternal Grandmother   . Diabetes Maternal Grandmother     borderline  . Hypertension Paternal Grandmother   . Arthritis Paternal Grandmother   . Stroke Paternal Grandmother   . Rheum arthritis Paternal Grandfather   . Rheum arthritis Sister   . Rheum arthritis Brother    Social History  Substance Use Topics  . Smoking status: Current Every Day Smoker -- 0.50 packs/day    Types: Cigarettes  . Smokeless tobacco: Never Used  . Alcohol Use: 0.6 oz/week    0 Standard drinks or equivalent, 1 Glasses of wine per week     Comment: one drink about once a month   OB History    Gravida Para Term Preterm AB TAB SAB Ectopic Multiple Living   3 3        3      Review of Systems  Constitutional: Positive for appetite change and fatigue. Negative for fever.  HENT: Negative for congestion.   Eyes: Negative for visual disturbance.  Respiratory: Negative for cough and shortness of breath.   Cardiovascular:  Negative.   Gastrointestinal: Negative for nausea, vomiting and abdominal pain.  Musculoskeletal: Negative for back pain and neck pain.  Skin: Negative for rash.  Neurological: Negative for headaches.  Psychiatric/Behavioral: Positive for sleep disturbance. Negative for suicidal ideas.      Allergies  Review of patient's allergies indicates no known allergies.  Home Medications   Prior to Admission medications   Medication Sig Start Date End Date Taking? Authorizing Provider  zolpidem (AMBIEN) 5 MG tablet Take 1 tablet (5 mg total) by mouth at bedtime. 10/03/15  Yes Kerrie Buffalo, NP  hydrOXYzine (ATARAX/VISTARIL) 50 MG tablet Take 1 tablet (50 mg total) by mouth every 6 (six) hours  as needed for anxiety. Patient not taking: Reported on 10/10/2015 10/03/15   Kerrie Buffalo, NP  megestrol (MEGACE) 40 MG/ML suspension Take 10 mLs (400 mg total) by mouth daily. 10/10/15   Ozella Almond Amylee Lodato, PA-C  nicotine (NICODERM CQ - DOSED IN MG/24 HOURS) 14 mg/24hr patch Place 1 patch (14 mg total) onto the skin daily. Patient not taking: Reported on 10/10/2015 10/03/15   Kerrie Buffalo, NP   BP 125/78 mmHg  Pulse 70  Temp(Src) 98.4 F (36.9 C) (Oral)  Resp 16  SpO2 98%  LMP 05/20/2010 Physical Exam  Constitutional: She is oriented to person, place, and time. She appears well-developed and well-nourished.  Very thin, tearful. NAD.   HENT:  Head: Normocephalic and atraumatic.  OP clear. Tacky mucus membranes.   Cardiovascular: Normal rate, regular rhythm and normal heart sounds.   Pulmonary/Chest: Effort normal and breath sounds normal. No respiratory distress. She has no wheezes. She has no rales.  Abdominal: Soft. Bowel sounds are normal. She exhibits no distension. There is no tenderness.  Musculoskeletal: Normal range of motion.  Neurological: She is alert and oriented to person, place, and time.  Skin: Skin is warm and dry. No pallor.  Cap refill < 3 seconds.   Nursing note and vitals reviewed.   ED Course  Procedures (including critical care time) Labs Review Labs Reviewed  COMPREHENSIVE METABOLIC PANEL - Abnormal; Notable for the following:    Glucose, Bld 102 (*)    All other components within normal limits  CBC WITH DIFFERENTIAL/PLATELET - Abnormal; Notable for the following:    Hemoglobin 16.3 (*)    All other components within normal limits  URINALYSIS, ROUTINE W REFLEX MICROSCOPIC (NOT AT [facility])  URINE RAPID DRUG SCREEN, HOSP PERFORMED    Imaging Review No results found. I have personally reviewed and evaluated these images and lab results as part of my medical decision-making.   EKG Interpretation None      MDM   Final diagnoses:  Decreased appetite   Anxious mood   [name] is a 50 y.o. female with PMH of anxiety/depression followed by PCP who presents to ED today for decreased appetite and difficulty sleeping. Patient is requesting no TTS consult at this time. No SI/HI. No AVH. Husband and daughter are at bedside. Patient seems to have a good support system. Husband has set up appointment for PCP on Monday 7/10. I feel that she has a safe environment at home and will proceed without TTS consult at this time. Will obtain cbc, cmp, and urine. Appears dry on exam and experiencing dry mouth at home -- will give 1L fluids. Patient reevaluated and states she feels better after fluids. Crackers and peanut butter given and patient ate in ED with no difficulty. CBC, CMP, UA, UDS reviewed and reassuring. Rx for Megace given  to help stimulate appetite over the weekend until patient can see PCP on Monday. A significant amount of time was taken to discuss this medication as well as the importance of sleep, hydration, and eating well. Patient is in agreement with plan as dictated above and understands reasons to return to ED. All questions answered.   Patient seen by and discussed with Dr. Christy Gentles who agrees with treatment plan.     [facility] Tijuan Dantes, PA-C 10/10/15 1354  Ripley Fraise, MD 10/11/15 [phone]

## 2015-11-01 DIAGNOSIS — F3341 Major depressive disorder, recurrent, in partial remission: Secondary | ICD-10-CM | POA: Insufficient documentation

## 2015-11-20 ENCOUNTER — Ambulatory Visit (HOSPITAL_COMMUNITY): Payer: Self-pay | Admitting: Psychiatry

## 2015-12-18 ENCOUNTER — Other Ambulatory Visit: Payer: Self-pay | Admitting: Obstetrics and Gynecology

## 2015-12-18 DIAGNOSIS — Z1231 Encounter for screening mammogram for malignant neoplasm of breast: Secondary | ICD-10-CM

## 2015-12-30 ENCOUNTER — Ambulatory Visit
Admission: RE | Admit: 2015-12-30 | Discharge: 2015-12-30 | Disposition: A | Discharge status: Home / Self Care | Payer: BLUE CROSS/BLUE SHIELD | Source: Ambulatory Visit | Attending: Obstetrics and Gynecology | Admitting: Obstetrics and Gynecology

## 2015-12-30 DIAGNOSIS — Z1231 Encounter for screening mammogram for malignant neoplasm of breast: Secondary | ICD-10-CM

## 2016-04-01 ENCOUNTER — Encounter: Payer: Self-pay | Admitting: Family Medicine

## 2016-06-08 DIAGNOSIS — R87612 Low grade squamous intraepithelial lesion on cytologic smear of cervix (LGSIL): Secondary | ICD-10-CM | POA: Insufficient documentation

## 2016-12-09 ENCOUNTER — Other Ambulatory Visit: Payer: Self-pay | Admitting: Obstetrics and Gynecology

## 2016-12-09 DIAGNOSIS — Z1231 Encounter for screening mammogram for malignant neoplasm of breast: Secondary | ICD-10-CM

## 2016-12-30 ENCOUNTER — Ambulatory Visit
Admission: RE | Admit: 2016-12-30 | Discharge: 2016-12-30 | Disposition: A | Discharge status: Home / Self Care | Payer: BLUE CROSS/BLUE SHIELD | Source: Ambulatory Visit | Attending: Obstetrics and Gynecology | Admitting: Obstetrics and Gynecology

## 2016-12-30 DIAGNOSIS — Z1231 Encounter for screening mammogram for malignant neoplasm of breast: Secondary | ICD-10-CM

## 2017-08-02 DIAGNOSIS — L309 Dermatitis, unspecified: Secondary | ICD-10-CM | POA: Insufficient documentation

## 2017-10-17 ENCOUNTER — Encounter: Payer: Self-pay | Admitting: Physician Assistant

## 2017-10-17 IMAGING — DX DG CHEST 2V
2 series · 3 of 3 positions shown · non-contrast
Comparison: 07/03/2009

CLINICAL DATA: Chest pain.  Palpitations.

EXAM:
CHEST  2 VIEW

[Series 1: chest pa · 0.14mm/px · 2 of 2 slices shown]
[im 1/2]
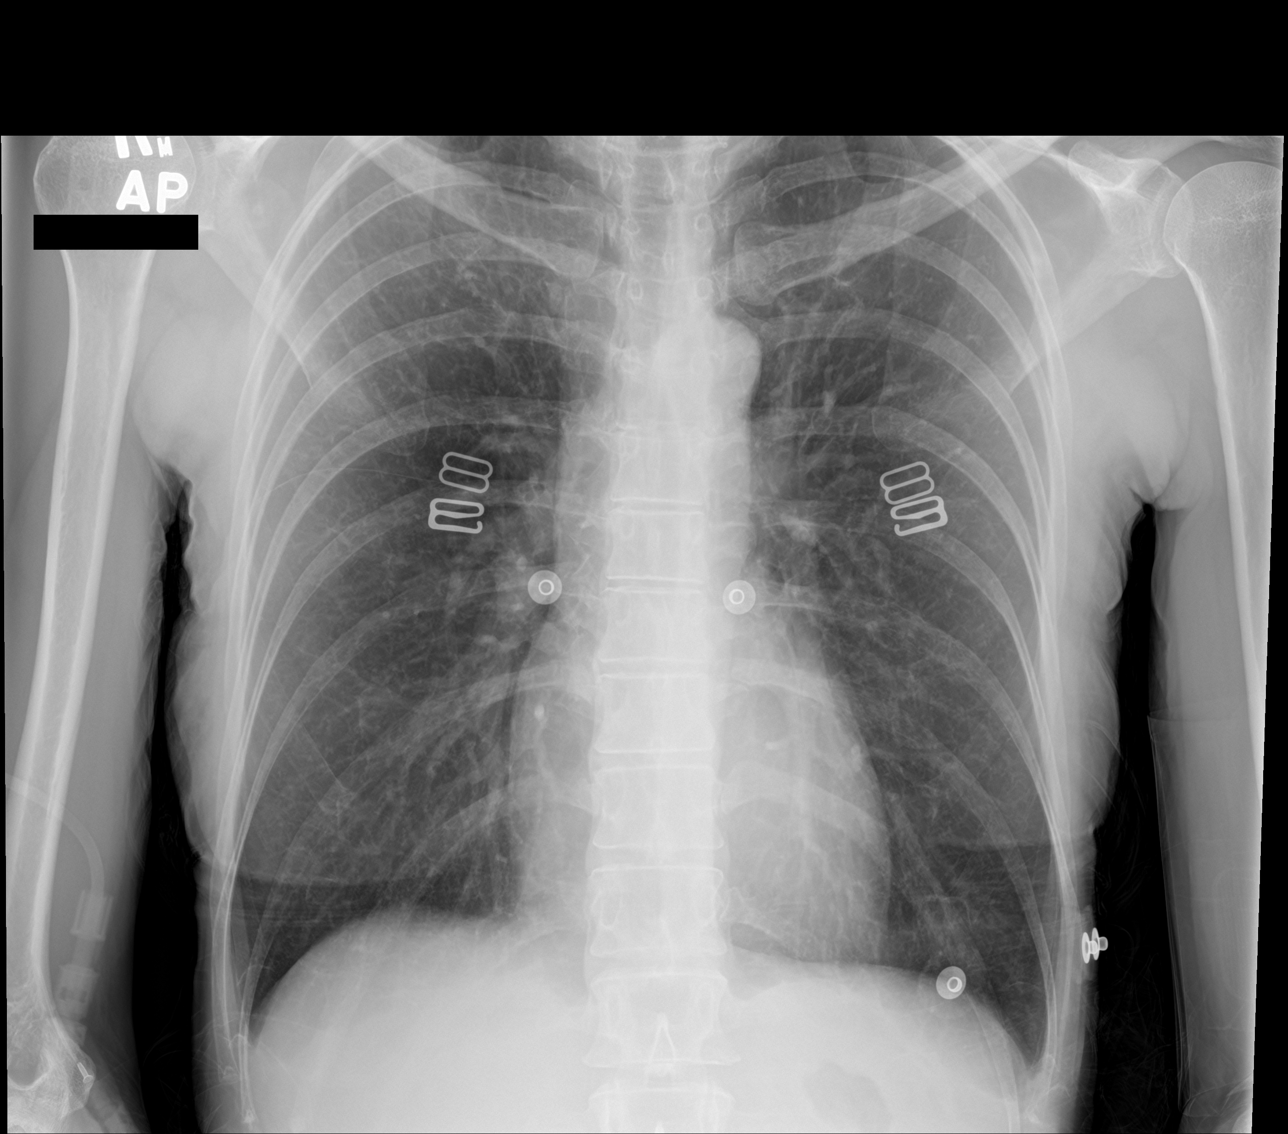
[im 2/2]
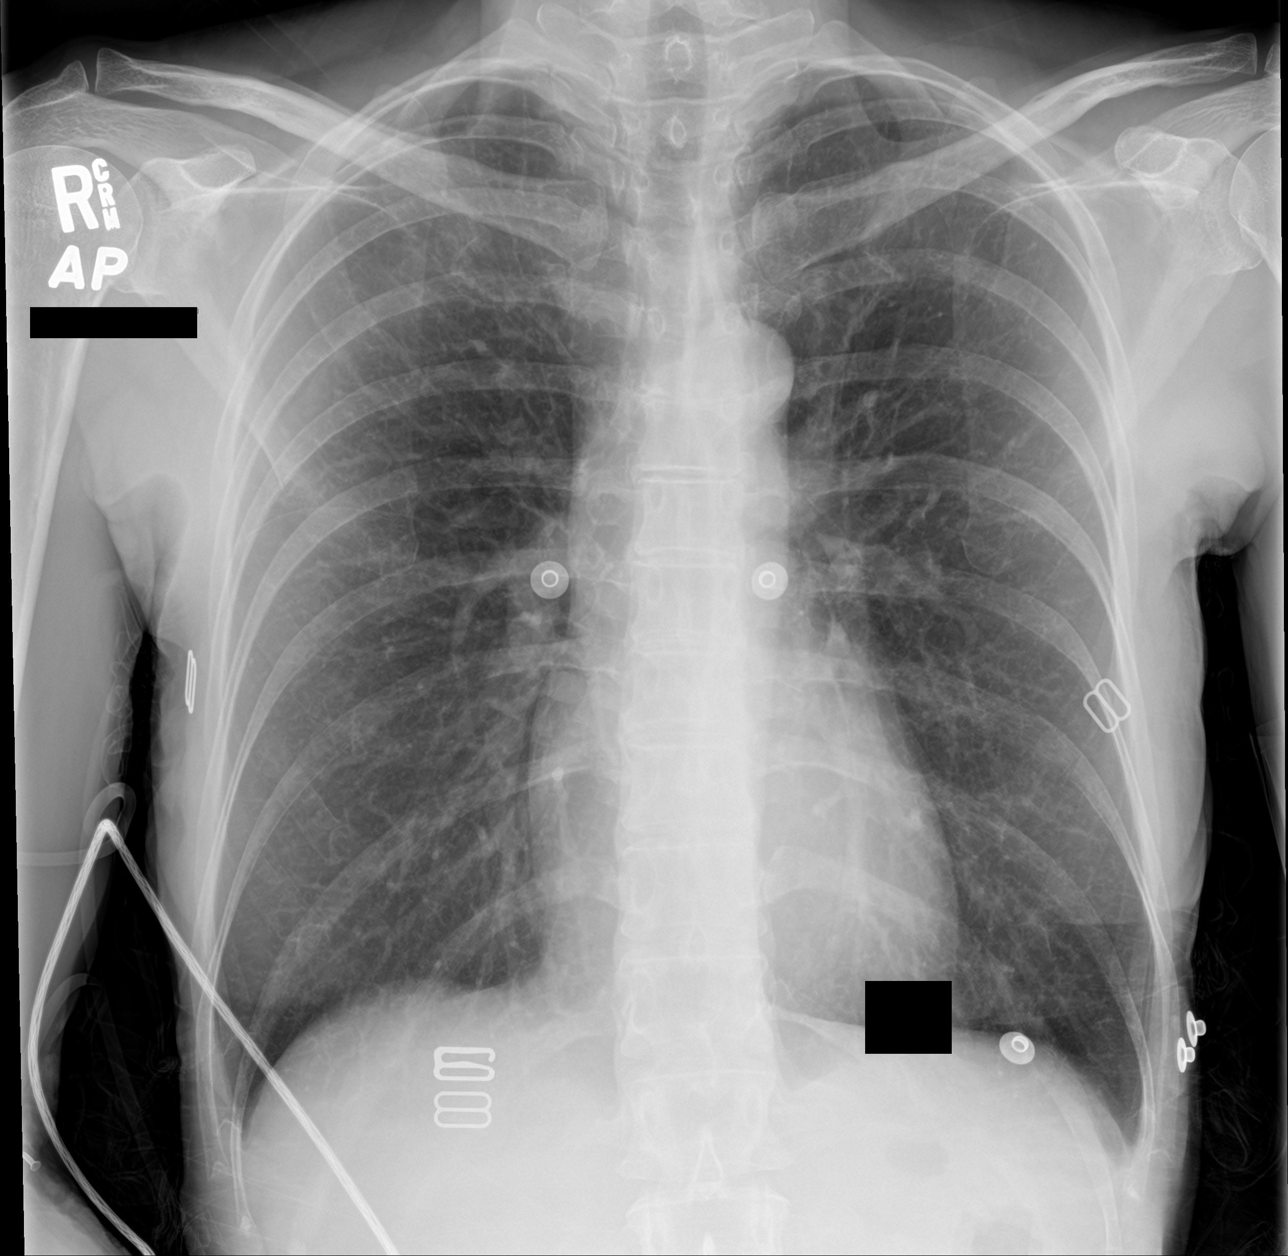

[chest lat]
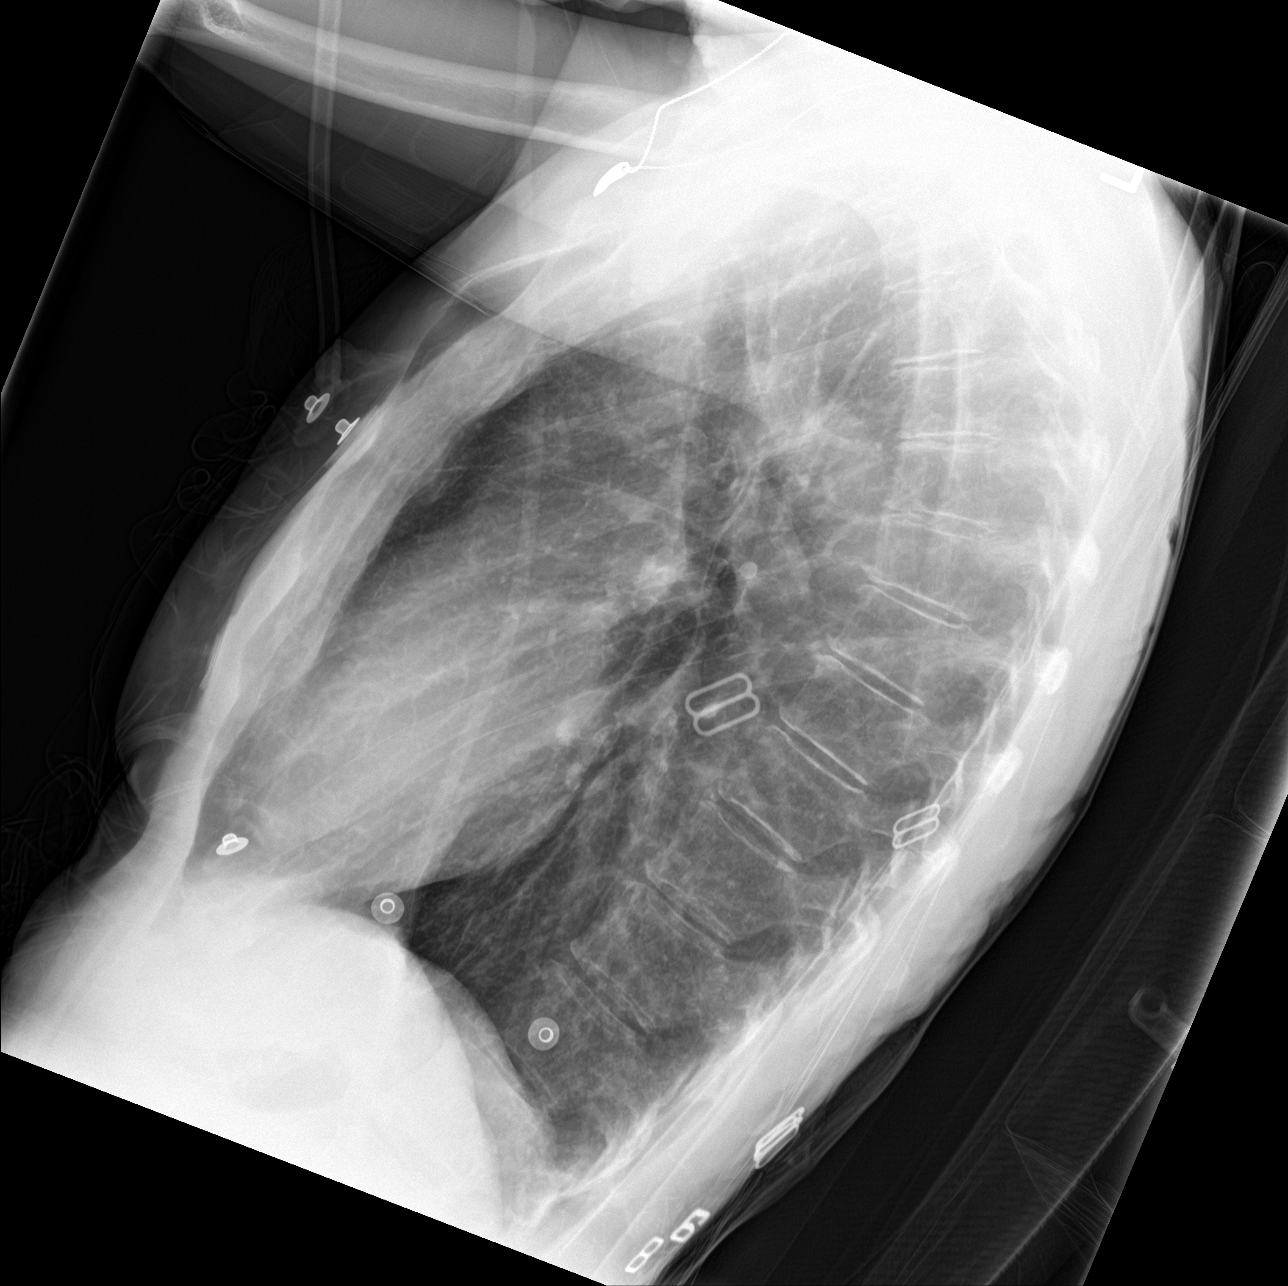

[3 of 3 positions shown; findings below may reference images not displayed]

FINDINGS: The heart size and mediastinal contours are within normal limits.
Both lungs are clear. The visualized skeletal structures are
unremarkable.
IMPRESSION: Normal exam.

## 2017-11-03 ENCOUNTER — Encounter: Payer: Self-pay | Admitting: Physician Assistant

## 2017-11-03 ENCOUNTER — Encounter (INDEPENDENT_AMBULATORY_CARE_PROVIDER_SITE_OTHER): Payer: Self-pay

## 2017-11-03 ENCOUNTER — Ambulatory Visit: Payer: BLUE CROSS/BLUE SHIELD | Admitting: Physician Assistant

## 2017-11-03 ENCOUNTER — Telehealth: Payer: Self-pay

## 2017-11-03 VITALS — BP 110/66 | HR 72 | Ht 63.0 in | Wt 133.0 lb

## 2017-11-03 DIAGNOSIS — R12 Heartburn: Secondary | ICD-10-CM | POA: Diagnosis not present

## 2017-11-03 DIAGNOSIS — R194 Change in bowel habit: Secondary | ICD-10-CM | POA: Diagnosis not present

## 2017-11-03 DIAGNOSIS — Z1211 Encounter for screening for malignant neoplasm of colon: Secondary | ICD-10-CM

## 2017-11-03 MED ORDER — OMEPRAZOLE 20 MG PO CPDR: 20.0000 mg | DELAYED_RELEASE_CAPSULE | Freq: Every day | ORAL | 3 refills | Status: DC

## 2017-11-03 MED ORDER — NA SULFATE-K SULFATE-MG SULF 17.5-3.13-1.6 GM/177ML PO SOLN: 1.0000 | ORAL | 0 refills | Status: DC

## 2017-11-03 NOTE — Progress Notes (Signed)
Desiree-pt has colonoscopy in 2010- so this is diagnostic. Can you please make sure it is scheduled for change in bowel habits and not screening. Let the patient know it will be billed differently.Thanks-JLL

## 2017-11-03 NOTE — Patient Instructions (Addendum)
You have been scheduled for a colonoscopy. Please follow written instructions given to you at your visit today.  Please pick up your prep supplies at the pharmacy within the next 1-3 days. If you use inhalers (even only as needed), please bring them with you on the day of your procedure. Your physician has requested that you go to www.startemmi.com and enter the access code given to you at your visit today. This web site gives a general overview about your procedure. However, you should still follow specific instructions given to you by our office regarding your preparation for the procedure.  We have sent the following medications to your pharmacy for you to pick up at your convenience: Prilosec 20 mg daily 30-60 minutes before breakfast

## 2017-11-03 NOTE — Telephone Encounter (Signed)
The colon has been changed to diagnostic bowel changes on the schedule and the pt is aware.

## 2017-11-03 NOTE — Progress Notes (Signed)
Chief Complaint: Change in bowel habits, heartburn  HPI:    [name] is a 52 year old female with a past medical history as listed below, who previously followed with Dr. Olevia Perches and who was referred to me by Suzan Garibaldi, FNP for a complaint of change in bowel habits and heartburn.      10/21/2008 colonoscopy was normal.  Patient was started on Amitiza for IBS.    Today, explains that she has a long history of depression, apparently 3 years ago was admitted to a psych center and underwent ECT, still follows with psychologist and is on multiple medications to help with this.  Tells me this has not been really any worse than her baseline depression in the past 6 months.  Has noticed though her bowel movements have changed.  Per her memory, she has always had a normal bowel movements, one every morning that is soft and solid, over past 6 months she has had a mixture of some loose urgent stools after eating and then occasional days of no bowel movement at all.  Also sometimes has formed stools.  Denies known history fo IBS in the past. Describes a "horrific smell" that accompanies these bowel movements, and it does not seem to matter what she eats.    Also noticed an increase in heartburn which is at least 5/7 days a week.  Relates this to the amount of coffee that she drinks which is "excessive".  Believes she may have been on something for this in the past as well.    Family history positive for colon cancer in a first cousin of hers at the age of 86, also some other various cancers in her family.  This has made her somewhat anxious.    Denies fever, chills, weight loss, blood in her stool, melena, abdominal pain or symptoms that awaken her at night.  Past Medical History:  Diagnosis Date  . Abdominal pain   . Anal itching   . Breast inflammation   . Breast nodule    left  . Chronic kidney disease   . Cystocele   . Decreased libido   . Depression   . Fibroadenoma   . Galactorrhea   .  Mastodynia   . Pelvic pain in female   . Pelvic relaxation   . Rectocele   . Serotonin syndrome     Past Surgical History:  Procedure Laterality Date  . ABDOMINAL HYSTERECTOMY     partial  . BREAST BIOPSY    . HERNIA REPAIR    . INCONTINENCE SURGERY    . OVARIAN CYST REMOVAL     left    Current Outpatient Medications  Medication Sig Dispense Refill  . ALPRAZolam (XANAX XR) 0.5 MG 24 hr tablet Take 0.5 mg by mouth daily.    Marland Kitchen lithium carbonate 300 MG capsule Take 300 mg by mouth daily.    . nefazodone (SERZONE) 150 MG tablet Take 150 mg by mouth at bedtime.    Marland Kitchen OLANZapine (ZYPREXA) 5 MG tablet Take 5 mg by mouth at bedtime.    . Suvorexant (BELSOMRA) 20 MG TABS Take 1 tablet by mouth daily.    . Na Sulfate-K Sulfate-Mg Sulf 17.5-3.13-1.6 GM/177ML SOLN Take 1 kit by mouth as directed. 354 mL 0  . omeprazole (PRILOSEC) 20 MG capsule Take 1 capsule (20 mg total) by mouth daily. 90 capsule 3   No current facility-administered medications for this visit.     Allergies as of 11/03/2017  . (No Known  Allergies)    Family History  Problem Relation Age of Onset  . Heart disease Mother        angioplasty  . Cancer Father        prostate  . Hypertension Father   . Depression Father   . Bipolar disorder Father   . Rheum arthritis Sister   . Rheum arthritis Brother   . Hypertension Maternal Grandmother   . Diabetes Maternal Grandmother        borderline  . Hypertension Paternal Grandmother   . Arthritis Paternal Grandmother   . Stroke Paternal Grandmother   . Rheum arthritis Paternal Grandfather   . Breast cancer Neg Hx     Social History   Socioeconomic History  . Marital status: Married    Spouse name: Not on file  . Number of children: 3  . Years of education: College  . Highest education level: Not on file  Occupational History  . Occupation: Surveyor, mining: Futures trader at [employer]  . Financial resource strain: Not on file  . Food  insecurity:    Worry: Not on file    Inability: Not on file  . Transportation needs:    Medical: Not on file    Non-medical: Not on file  Tobacco Use  . Smoking status: Current Every Day Smoker    Packs/day: 0.50    Types: Cigarettes  . Smokeless tobacco: Never Used  Substance and Sexual Activity  . Alcohol use: Yes    Alcohol/week: 0.6 oz    Types: 1 Glasses of wine per week    Comment: one drink about once a month  . Drug use: No  . Sexual activity: Yes    Birth control/protection: Other-see comments    Comment: pt had hyst  Lifestyle  . Physical activity:    Days per week: Not on file    Minutes per session: Not on file  . Stress: Not on file  Relationships  . Social connections:    Talks on phone: Not on file    Gets together: Not on file    Attends religious service: Not on file    Active member of club or organization: Not on file    Attends meetings of clubs or organizations: Not on file    Relationship status: Not on file  . Intimate partner violence:    Fear of current or ex partner: Not on file    Emotionally abused: Not on file    Physically abused: Not on file    Forced sexual activity: Not on file  Other Topics Concern  . Not on file  Social History Narrative  . Not on file    Review of Systems:    Constitutional: No weight loss, fever or chills Skin: No rash  Cardiovascular: No chest pain Respiratory: No SOB  Gastrointestinal: See HPI and otherwise negative Genitourinary: No dysuria  Neurological: No headache Musculoskeletal: No new muscle or joint pain Hematologic: No bleeding Psychiatric: +depression   Physical Exam:  Vital signs: BP 110/66   Pulse 72   Ht '5\' 3"'  (1.6 m)   Wt 133 lb (60.3 kg)   LMP 05/20/2010   BMI 23.56 kg/m   Constitutional:   Pleasant Caucasian female appears to be in NAD, Well developed, Well nourished, alert and cooperative Head:  Normocephalic and atraumatic. Eyes:   PEERL, EOMI. No icterus. Conjunctiva  pink. Ears:  Normal auditory acuity. Neck:  Supple Throat: Oral cavity and pharynx  without inflammation, swelling or lesion.  Respiratory: Respirations even and unlabored. Lungs clear to auscultation bilaterally.   No wheezes, crackles, or rhonchi.  Cardiovascular: Normal S1, S2. No MRG. Regular rate and rhythm. No peripheral edema, cyanosis or pallor.  Gastrointestinal:  Soft, nondistended, nontender. No rebound or guarding. Normal bowel sounds. No appreciable masses or hepatomegaly. Rectal:  Not performed.  Msk:  Symmetrical without gross deformities. Without edema, no deformity or joint abnormality.  Neurologic:  Alert and  oriented x4;  grossly normal neurologically.  Skin:   Dry and intact without significant lesions or rashes. Psychiatric:  Demonstrates good judgement and reason without abnormal affect or behaviors.  No recent labs.  Assessment: 1.  Screening for colorectal cancer: Patient has not had a colonoscopy since turning 49 2.  Change in bowel habits: Change to a mixture of constipation, diarrhea and normal bowel movements, history of depression, diagnosis of IBS in the past; likely IBS 3.  Heartburn: Worse over the past few months; consider gastritis versus other  Plan: 1.  Patient is due for a screening colonoscopy at this time, will proceed with one in the [facility] with Dr. Loletha Carrow as his availability fit her requested time-frame.  Did discuss risk, benefits, limitations alternatives and the patient agrees to proceed. 2.  Started patient on Omeprazole 20 mg daily, 30-60 minutes before breakfast #30 with 2 refills 3.  Discussed with patient that likely her alteration in bowel habits is related to IBS, this is most likely given her history of depression and psychiatric problems. 4.  Patient to follow in clinic per recommendations from Dr. Loletha Carrow after time of procedure.  Ellouise Newer, PA-C [facility] 11/03/2017, 12:00 PM  Cc: Suzan Garibaldi, FNP

## 2017-11-03 NOTE — Telephone Encounter (Signed)
-----   Message from Wyline Beady, [place] sent at 11/03/2017  2:14 PM EDT -----   ----- Message ----- From: Doran Stabler, MD Sent: 11/03/2017  12:39 PM To: Levin Erp, PA    ----- Message ----- From: Levin Erp, [place] Sent: 11/03/2017  12:10 PM To: Doran Stabler, MD

## 2017-11-03 NOTE — Progress Notes (Signed)
Thank you for sending this case to me. I have reviewed the entire note, and the outlined plan seems appropriate.  She had a colonoscopy in 10/2008, so she is not due for a screening colonoscopy.  No family history of colon cancer in parent or sibling.  This would be a diagnostic colonoscopy for symptoms as described. Please make that clear to patient and staff for proper insurance authorization.  Wilfrid Lund, MD

## 2017-11-03 NOTE — Telephone Encounter (Signed)
Author: Levin Erp, PA Service: Gastroenterology Author Type: Physician Assistant  Filed: 11/03/2017 1:26 PM Encounter Date: 11/03/2017 Status: Signed  Editor: Levin Erp, PA (Physician Assistant)       Show:Clear all [x] Manual[] Template[] Copied  Added by: [x] Levin Erp, PA   [] Hover for details   Desiree-pt has colonoscopy in 2010- so this is diagnostic. Can you please make sure it is scheduled for change in bowel habits and not screening. Let the patient know it will be billed differently.Thanks-JLL

## 2017-11-09 ENCOUNTER — Encounter: Payer: Self-pay | Admitting: Gastroenterology

## 2017-11-16 ENCOUNTER — Encounter: Payer: Self-pay | Admitting: Gastroenterology

## 2017-11-16 ENCOUNTER — Ambulatory Visit (AMBULATORY_SURGERY_CENTER): Payer: BLUE CROSS/BLUE SHIELD | Admitting: Gastroenterology

## 2017-11-16 ENCOUNTER — Other Ambulatory Visit (INDEPENDENT_AMBULATORY_CARE_PROVIDER_SITE_OTHER): Payer: BLUE CROSS/BLUE SHIELD

## 2017-11-16 ENCOUNTER — Other Ambulatory Visit: Payer: Self-pay

## 2017-11-16 VITALS — BP 107/67 | HR 56 | Temp 98.0°F | Resp 13 | Ht 63.0 in | Wt 133.0 lb

## 2017-11-16 DIAGNOSIS — D120 Benign neoplasm of cecum: Secondary | ICD-10-CM

## 2017-11-16 DIAGNOSIS — K529 Noninfective gastroenteritis and colitis, unspecified: Secondary | ICD-10-CM

## 2017-11-16 LAB — CBC WITH DIFFERENTIAL/PLATELET
BASOS ABS: 0 10*3/uL (ref 0.0–0.1)
Basophils Relative: 0.7 % (ref 0.0–3.0)
Eosinophils Absolute: 0.2 10*3/uL (ref 0.0–0.7)
Eosinophils Relative: 3.4 % (ref 0.0–5.0)
HEMATOCRIT: 44.8 % (ref 36.0–46.0)
HEMOGLOBIN: 15 g/dL (ref 12.0–15.0)
LYMPHS ABS: 1.9 10*3/uL (ref 0.7–4.0)
LYMPHS PCT: 26.6 % (ref 12.0–46.0)
MCHC: 33.5 g/dL (ref 30.0–36.0)
MCV: 99.3 fl (ref 78.0–100.0)
MONOS PCT: 7.5 % (ref 3.0–12.0)
Monocytes Absolute: 0.5 10*3/uL (ref 0.1–1.0)
NEUTROS PCT: 61.8 % (ref 43.0–77.0)
Neutro Abs: 4.4 10*3/uL (ref 1.4–7.7)
Platelets: 209 10*3/uL (ref 150.0–400.0)
RBC: 4.51 Mil/uL (ref 3.87–5.11)
RDW: 13 % (ref 11.5–15.5)
WBC: 7.1 10*3/uL (ref 4.0–10.5)

## 2017-11-16 LAB — TSH: TSH: 1.26 u[IU]/mL (ref 0.35–4.50)

## 2017-11-16 LAB — IGA: IgA: 67 mg/dL — ABNORMAL LOW (ref 68–378)

## 2017-11-16 MED ORDER — SODIUM CHLORIDE 0.9 % IV SOLN: 500.0000 mL | Freq: Once | INTRAVENOUS | Status: DC

## 2017-11-16 NOTE — Progress Notes (Signed)
A/ox3 pleased with MAC, report to RN 

## 2017-11-16 NOTE — Patient Instructions (Signed)
YOU HAD AN ENDOSCOPIC PROCEDURE TODAY AT [facility]:   Refer to the procedure report that was given to you for any specific questions about what was found during the examination.  If the procedure report does not answer your questions, please call your gastroenterologist to clarify.  If you requested that your care partner not be given the details of your procedure findings, then the procedure report has been included in a sealed envelope for you to review at your convenience later.  YOU SHOULD EXPECT: Some feelings of bloating in the abdomen. Passage of more gas than usual.  Walking can help get rid of the air that was put into your GI tract during the procedure and reduce the bloating. If you had a lower endoscopy (such as a colonoscopy or flexible sigmoidoscopy) you may notice spotting of blood in your stool or on the toilet paper. If you underwent a bowel prep for your procedure, you may not have a normal bowel movement for a few days.  Please Note:  You might notice some irritation and congestion in your nose or some drainage.  This is from the oxygen used during your procedure.  There is no need for concern and it should clear up in a day or so.  SYMPTOMS TO REPORT IMMEDIATELY:   Following lower endoscopy (colonoscopy or flexible sigmoidoscopy):  Excessive amounts of blood in the stool  Significant tenderness or worsening of abdominal pains  Swelling of the abdomen that is new, acute  Fever of 100F or higher   For urgent or emergent issues, a gastroenterologist can be reached at any hour by calling [phone].   DIET:  We do recommend a small meal at first, but then you may proceed to your regular diet.  Drink plenty of fluids but you should avoid alcoholic beverages for 24 hours.  ACTIVITY:  You should plan to take it easy for the rest of today and you should NOT DRIVE or use heavy machinery until tomorrow (because of the sedation medicines used during the test).     FOLLOW UP: Our staff will call the number listed on your records the next business day following your procedure to check on you and address any questions or concerns that you may have regarding the information given to you following your procedure. If we do not reach you, we will leave a message.  However, if you are feeling well and you are not experiencing any problems, there is no need to return our call.  We will assume that you have returned to your regular daily activities without incident.  If any biopsies were taken you will be contacted by phone or by letter within the next 1-3 weeks.  Please call us at [phone] if you have not heard about the biopsies in 3 weeks.    SIGNATURES/CONFIDENTIALITY: You and/or your care partner have signed paperwork which will be entered into your electronic medical record.  These signatures attest to the fact that that the information above on your After Visit Summary has been reviewed and is understood.  Full responsibility of the confidentiality of this discharge information lies with you and/or your care-partner.   Handout was given to your care partner on polyps. You may resume your current medications today. Await for biopsy results. Please call if any questions or concerns.

## 2017-11-16 NOTE — Progress Notes (Signed)
Called to room to assist during endoscopic procedure.  Patient ID and intended procedure confirmed with present staff. Received instructions for my participation in the procedure from the performing physician.  

## 2017-11-16 NOTE — Progress Notes (Signed)
Per Anne Ng, Dr. Loletha Carrow: orders entered for lab work to be completed upon discharge from the [facility].

## 2017-11-16 NOTE — Progress Notes (Signed)
Pt's states no medical or surgical changes since previsit or office visit. 

## 2017-11-16 NOTE — Op Note (Signed)
[place] Patient Name: [name] Procedure Date: 11/16/2017 8:29 AM MRN: [id number] Endoscopist: Mallie Mussel L. Loletha Carrow , MD Age: 52 Referring MD:  Date of Birth: 12/22/1965 Gender: Female Account #: [id number] Procedure:                Colonoscopy Indications:              Chronic diarrhea Medicines:                Monitored Anesthesia Care Procedure:                Pre-Anesthesia Assessment:                           - Prior to the procedure, a History and Physical                            was performed, and patient medications and                            allergies were reviewed. The patient's tolerance of                            previous anesthesia was also reviewed. The risks                            and benefits of the procedure and the sedation                            options and risks were discussed with the patient.                            All questions were answered, and informed consent                            was obtained. Prior Anticoagulants: The patient has                            taken no previous anticoagulant or antiplatelet                            agents. ASA Grade Assessment: II - A patient with                            mild systemic disease. After reviewing the risks                            and benefits, the patient was deemed in                            satisfactory condition to undergo the procedure.                           After obtaining informed consent, the colonoscope  was passed under direct vision. Throughout the                            procedure, the patient's blood pressure, pulse, and                            oxygen saturations were monitored continuously. The                            Model CF-HQ190L [id number]) scope was introduced                            through the anus and advanced to the the terminal                            ileum, with identification of the  appendiceal                            orifice and IC valve. The colonoscopy was performed                            without difficulty. The patient tolerated the                            procedure well. The quality of the bowel                            preparation was excellent. The terminal ileum,                            ileocecal valve, appendiceal orifice, and rectum                            were photographed. The quality of the bowel                            preparation was evaluated using the BBPS [facility]                            Bowel Preparation Scale) with scores of: Right                            Colon = 3 (entire mucosa seen well with no residual                            staining, small fragments of stool or opaque                            liquid), Transverse Colon = 3 (entire mucosa seen                            well with no residual staining, small fragments of  stool or opaque liquid) and Left Colon = 3 (entire                            mucosa seen well with no residual staining, small                            fragments of stool or opaque liquid). The total                            BBPS score equals 9. Scope In: 8:39:50 AM Scope Out: 8:55:13 AM Scope Withdrawal Time: 0 hours 11 minutes 19 seconds  Total Procedure Duration: 0 hours 15 minutes 23 seconds  Findings:                 The perianal and digital rectal examinations were                            normal.                           The terminal ileum appeared normal.                           A 2 mm polyp was found in the cecum. The polyp was                            flat. The polyp was removed with a cold biopsy                            forceps. Resection and retrieval were complete.                           Normal mucosa was found in the entire colon.                            Biopsies for histology were taken with a cold                            forceps  from the right colon and left colon for                            evaluation of microscopic colitis.                           A few small-mouthed diverticula were found in the                            sigmoid colon.                           The exam was otherwise without abnormality on                            direct and retroflexion views. Complications:  No immediate complications. Estimated Blood Loss:     Estimated blood loss was minimal. Impression:               - The examined portion of the ileum was normal.                           - One 2 mm polyp in the cecum, removed with a cold                            biopsy forceps. Resected and retrieved.                           - Normal mucosa in the entire examined colon.                            Biopsied.                           - Diverticulosis in the sigmoid colon.                           - The examination was otherwise normal on direct                            and retroflexion views. Recommendation:           - Patient has a contact number available for                            emergencies. The signs and symptoms of potential                            delayed complications were discussed with the                            patient. Return to normal activities tomorrow.                            Written discharge instructions were provided to the                            patient.                           - Resume previous diet.                           - Continue present medications.                           - Await pathology results.                           - Repeat colonoscopy is recommended for                            surveillance. The colonoscopy  date will be                            determined after pathology results from today's                            exam become available for review.                           - Return to my office at appointment to be                             scheduled.                           - Labs: CBC, TSH, Tissue Transglutaminase (IgA),                            total IgA level Osmani Kersten L. Loletha Carrow, MD 11/16/2017 9:04:05 AM This report has been signed electronically.

## 2017-11-17 ENCOUNTER — Telehealth: Payer: Self-pay

## 2017-11-17 LAB — TISSUE TRANSGLUTAMINASE, IGA: (TTG) AB, IGA: 1 U/mL

## 2017-11-17 NOTE — Telephone Encounter (Signed)
  Follow up Call-  Call back number 11/16/2017  Post procedure Call Back phone  # [phone]  Permission to leave phone message Yes  Some recent data might be hidden     Patient questions:  Do you have a fever, pain , or abdominal swelling? No. Pain Score  0 *  Have you tolerated food without any problems? Yes.    Have you been able to return to your normal activities? Yes.    Do you have any questions about your discharge instructions: Diet   No. Medications  No. Follow up visit  No.  Do you have questions or concerns about your Care? No.  Actions: * If pain score is 4 or above: No action needed, pain <4.

## 2017-11-18 ENCOUNTER — Other Ambulatory Visit: Payer: Self-pay

## 2017-11-18 DIAGNOSIS — K529 Noninfective gastroenteritis and colitis, unspecified: Secondary | ICD-10-CM

## 2017-11-23 ENCOUNTER — Encounter: Payer: Self-pay | Admitting: Gastroenterology

## 2017-12-01 ENCOUNTER — Other Ambulatory Visit: Payer: Self-pay | Admitting: Obstetrics and Gynecology

## 2017-12-01 DIAGNOSIS — Z1231 Encounter for screening mammogram for malignant neoplasm of breast: Secondary | ICD-10-CM

## 2017-12-13 ENCOUNTER — Other Ambulatory Visit: Payer: BLUE CROSS/BLUE SHIELD

## 2017-12-13 DIAGNOSIS — K529 Noninfective gastroenteritis and colitis, unspecified: Secondary | ICD-10-CM

## 2017-12-15 LAB — GLIADIN DEAMIDATED PEPT AB,IGG: Gliadin IgG: 3 Units

## 2017-12-27 ENCOUNTER — Ambulatory Visit: Payer: BLUE CROSS/BLUE SHIELD | Admitting: Gastroenterology

## 2017-12-27 ENCOUNTER — Encounter: Payer: Self-pay | Admitting: Gastroenterology

## 2017-12-27 VITALS — BP 100/70 | HR 94 | Ht 63.0 in | Wt 133.0 lb

## 2017-12-27 DIAGNOSIS — K529 Noninfective gastroenteritis and colitis, unspecified: Secondary | ICD-10-CM | POA: Diagnosis not present

## 2017-12-27 DIAGNOSIS — D120 Benign neoplasm of cecum: Secondary | ICD-10-CM | POA: Diagnosis not present

## 2017-12-27 DIAGNOSIS — R12 Heartburn: Secondary | ICD-10-CM | POA: Diagnosis not present

## 2017-12-27 NOTE — Patient Instructions (Signed)
If you are age 53 or older, your body mass index should be between 23-30. Your Body mass index is 23.56 kg/m. If this is out of the aforementioned range listed, please consider follow up with your Primary Care Provider.  If you are age 5 or younger, your body mass index should be between 19-25. Your Body mass index is 23.56 kg/m. If this is out of the aformentioned range listed, please consider follow up with your Primary Care Provider.   It was a pleasure to see you today!  Dr. Loletha Carrow

## 2017-12-27 NOTE — Progress Notes (Signed)
[name] [facility] Progress Note  Chief Complaint: Chronic diarrhea  Subjective  History:  [name] follows up for her chronic diarrhea and heartburn.  She has affective issues as described in previous notes. Colonoscopy to the terminal ileum on 11/16/2017 was normal except for diminutive cecal tubular adenoma.  Random colon biopsies were negative for microscopic colitis.  Additional labs were done as reported below. Weight stable at 133 pounds since at least early August.  She has been feeling generally well, with a good appetite and stable weight.  Most of the time she will have normal bowel movements, then perhaps once a week she will have a day of the lower abdominal pain, urgency and loose stool.  She might then not have a bowel movement for a day or 2.  She has not noticed any clear or consistent triggers such as food or stress.  She believes that she was on a stable regimen of meds prior to the onset of the symptoms about 6 months ago.  She had not had any travel or sick contacts or antibiotic use prior to symptom onset. Her heartburn is now well controlled on once daily omeprazole.  She denies dysphagia or odynophagia, nausea, vomiting or early satiety.   ROS: Cardiovascular:  no chest pain Respiratory: no dyspnea Chronic depression -  she is going to undergo another round of [place] (ECT)  The patient's Past Medical, Family and Social History were reviewed and are on file in the EMR.  Objective:  Med list reviewed  Current Outpatient Medications:  .  lithium carbonate 300 MG capsule, Take 300 mg by mouth daily., Disp: , Rfl:  .  nefazodone (SERZONE) 150 MG tablet, Take 150 mg by mouth at bedtime., Disp: , Rfl:  .  OLANZapine (ZYPREXA) 5 MG tablet, Take 5 mg by mouth at bedtime., Disp: , Rfl:  .  omeprazole (PRILOSEC) 20 MG capsule, Take 1 capsule (20 mg total) by mouth daily., Disp: 90 capsule, Rfl: 3   Vital signs in last 24 hrs: Vitals:   12/27/17 1127  BP: 100/70    Pulse: 94    Physical Exam  Pleasant and conversational, no muscle wasting  HEENT: sclera anicteric, oral mucosa moist without lesions  Neck: supple, no thyromegaly, JVD or lymphadenopathy  Cardiac: RRR without murmurs, S1S2 heard, no peripheral edema  Pulm: clear to auscultation bilaterally, normal RR and effort noted  Abdomen: soft, no tenderness, with active bowel sounds. No guarding or palpable hepatosplenomegaly.  Skin; warm and dry, no jaundice or rash  Recent Labs:  CBC Latest Ref Rng & Units 11/16/2017 10/10/2015 09/29/2015  WBC 4.0 - 10.5 K/uL 7.1 7.6 8.2  Hemoglobin 12.0 - 15.0 g/dL 15.0 16.3(H) 18.1(H)  Hematocrit 36.0 - 46.0 % 44.8 45.4 50.9(H)  Platelets 150.0 - 400.0 K/uL 209.0 204 205   Normal TSH Normal TTG IgA antibody, but IgA level low at 67 Subsequent IgG deamidated anti-gliadin antibody normal at 3   @ASSESSMENTPLANBEGIN @ Assessment: Encounter Diagnoses  Name Primary?  . Chronic diarrhea Yes  . Benign neoplasm of cecum   . Heartburn     This seems most consistent with IBS.  She has not had a cholecystectomy to suggest bile acid diarrhea, has no history of alcohol abuse or pancreatitis to suggest likelihood of exocrine pancreatic insufficiency.  No prior intestinal surgery or other clear risk factors for small bowel bacterial overgrowth.  Her weight has been stable, speaking against malabsorption.  She might consider going on a gluten-free diet, since  her husband does so she tends to make the food in the house.  She does not clearly have celiac sprue based on blood antibody testing, but could be non-celiac gluten sensitive.  I offered her a trial of antispasmodic medicine to take as needed, but she does not feel the condition affects her enough to warrant more medicines.  She is reassured that she had an examination, that the colon polyp was benign and she does not need another routine colonoscopy for 5 years.  I encouraged her to contact me as needed.   All questions were answered.  Total time 20 minutes, over half spent face-to-face with patient in counseling and coordination of care.   Nelida Meuse III

## 2017-12-29 ENCOUNTER — Ambulatory Visit (INDEPENDENT_AMBULATORY_CARE_PROVIDER_SITE_OTHER): Payer: BLUE CROSS/BLUE SHIELD

## 2017-12-29 ENCOUNTER — Encounter: Payer: Self-pay | Admitting: Podiatry

## 2017-12-29 ENCOUNTER — Other Ambulatory Visit: Payer: Self-pay | Admitting: Podiatry

## 2017-12-29 ENCOUNTER — Ambulatory Visit: Payer: BLUE CROSS/BLUE SHIELD | Admitting: Podiatry

## 2017-12-29 DIAGNOSIS — G5761 Lesion of plantar nerve, right lower limb: Secondary | ICD-10-CM | POA: Diagnosis not present

## 2017-12-29 DIAGNOSIS — G5781 Other specified mononeuropathies of right lower limb: Secondary | ICD-10-CM

## 2017-12-29 DIAGNOSIS — M779 Enthesopathy, unspecified: Principal | ICD-10-CM

## 2017-12-29 DIAGNOSIS — R102 Pelvic and perineal pain: Secondary | ICD-10-CM | POA: Insufficient documentation

## 2017-12-29 DIAGNOSIS — M778 Other enthesopathies, not elsewhere classified: Secondary | ICD-10-CM

## 2017-12-29 DIAGNOSIS — N951 Menopausal and female climacteric states: Secondary | ICD-10-CM | POA: Insufficient documentation

## 2017-12-29 NOTE — Progress Notes (Signed)
  Subjective:  Patient ID: [name], female    DOB: 05/27/1965,  MRN: [id number] HPI Chief Complaint  Patient presents with  . Foot Pain    3rd and 4th toes right - sharp, shooting pains intermittent x years, evaluated by Dr. Janus Molder years ago-said pinched nerve-she didn't want any treatment at that time  . New Patient (Initial Visit)    Est pt 3+    52 y.o. female presents with the above complaint.   ROS: Denies fever chills nausea vomiting muscle aches pains calf pain back pain chest pain shortness of breath.  Past Medical History:  Diagnosis Date  . Abdominal pain   . Anal itching   . Breast inflammation   . Breast nodule    left  . Chronic kidney disease   . Cystocele   . Decreased libido   . Depression   . Fibroadenoma   . Galactorrhea   . Mastodynia   . Pelvic pain in female   . Pelvic relaxation   . Rectocele   . Serotonin syndrome    Past Surgical History:  Procedure Laterality Date  . ABDOMINAL HYSTERECTOMY     partial  . BREAST BIOPSY    . HERNIA REPAIR    . INCONTINENCE SURGERY    . OVARIAN CYST REMOVAL     left    Current Outpatient Medications:  .  lithium carbonate 300 MG capsule, Take 300 mg by mouth daily., Disp: , Rfl:  .  nefazodone (SERZONE) 150 MG tablet, Take 150 mg by mouth at bedtime., Disp: , Rfl:  .  OLANZapine (ZYPREXA) 5 MG tablet, Take 5 mg by mouth at bedtime., Disp: , Rfl:  .  omeprazole (PRILOSEC) 20 MG capsule, Take 1 capsule (20 mg total) by mouth daily., Disp: 90 capsule, Rfl: 3  No Known Allergies Review of Systems Objective:  There were no vitals filed for this visit.  General: Well developed, nourished, in no acute distress, alert and oriented x3   Dermatological: Skin is warm, dry and supple bilateral. Nails x 10 are well maintained; remaining integument appears unremarkable at this time. There are no open sores, no preulcerative lesions, no rash or signs of infection present.  Vascular: Dorsalis Pedis  artery and Posterior Tibial artery pedal pulses are 2/4 bilateral with immedate capillary fill time. Pedal hair growth present. No varicosities and no lower extremity edema present bilateral.   Neruologic: Grossly intact via light touch bilateral. Vibratory intact via tuning fork bilateral. Protective threshold with Semmes Wienstein monofilament intact to all pedal sites bilateral. Patellar and Achilles deep tendon reflexes 2+ bilateral. No Babinski or clonus noted bilateral.   Musculoskeletal: No gross boney pedal deformities bilateral. No pain, crepitus, or limitation noted with foot and ankle range of motion bilateral. Muscular strength 5/5 in all groups tested bilateral.  Gait: Unassisted, Nonantalgic.    Radiographs:  Radiographs demonstrate no acute findings.  Mild hammertoe deformities noted.  Assessment & Plan:   Assessment: Neuroma third interdigital space right foot.  Plan: Provided her with both oral and written home-going instructions.  We discussed etiology pathology conservative surgical therapies this point after sterile Betadine skin prep injected 10 mg Kenalog 5 mg Marcaine point maximal tenderness right foot.  Tolerated procedure well without complications.  We did discuss the possible need for dehydrated alcohol injections I will follow-up with her in 1 month     Ruthie Berch T. [place], [place]

## 2018-01-03 ENCOUNTER — Ambulatory Visit
Admission: RE | Admit: 2018-01-03 | Discharge: 2018-01-03 | Disposition: A | Discharge status: Home / Self Care | Payer: BLUE CROSS/BLUE SHIELD | Source: Ambulatory Visit | Attending: Obstetrics and Gynecology | Admitting: Obstetrics and Gynecology

## 2018-01-03 DIAGNOSIS — Z1231 Encounter for screening mammogram for malignant neoplasm of breast: Secondary | ICD-10-CM

## 2018-01-19 ENCOUNTER — Encounter: Payer: Self-pay | Admitting: Emergency Medicine

## 2018-01-20 ENCOUNTER — Other Ambulatory Visit: Payer: Self-pay | Admitting: Psychiatry

## 2018-01-20 ENCOUNTER — Other Ambulatory Visit: Payer: Self-pay

## 2018-01-23 ENCOUNTER — Other Ambulatory Visit: Payer: Self-pay

## 2018-01-23 MED ORDER — LITHIUM CARBONATE 300 MG PO CAPS: 600.0000 mg | ORAL_CAPSULE | Freq: Every day | ORAL | 1 refills | Status: DC

## 2018-01-31 ENCOUNTER — Ambulatory Visit: Payer: Self-pay | Admitting: Psychiatry

## 2018-02-02 ENCOUNTER — Ambulatory Visit: Payer: BLUE CROSS/BLUE SHIELD | Admitting: Podiatry

## 2018-02-16 ENCOUNTER — Other Ambulatory Visit: Payer: Self-pay | Admitting: Psychiatry

## 2018-02-17 ENCOUNTER — Other Ambulatory Visit: Payer: Self-pay

## 2018-02-17 MED ORDER — OLANZAPINE 5 MG PO TABS: 5.0000 mg | ORAL_TABLET | Freq: Every day | ORAL | 0 refills | Status: DC

## 2018-02-21 ENCOUNTER — Other Ambulatory Visit: Payer: Self-pay | Admitting: Psychiatry

## 2018-02-21 ENCOUNTER — Ambulatory Visit: Payer: BLUE CROSS/BLUE SHIELD | Admitting: Psychiatry

## 2018-02-21 ENCOUNTER — Encounter: Payer: Self-pay | Admitting: Psychiatry

## 2018-02-21 DIAGNOSIS — R7989 Other specified abnormal findings of blood chemistry: Secondary | ICD-10-CM | POA: Diagnosis not present

## 2018-02-21 DIAGNOSIS — F331 Major depressive disorder, recurrent, moderate: Secondary | ICD-10-CM

## 2018-02-21 DIAGNOSIS — F5105 Insomnia due to other mental disorder: Secondary | ICD-10-CM | POA: Diagnosis not present

## 2018-02-21 DIAGNOSIS — F339 Major depressive disorder, recurrent, unspecified: Secondary | ICD-10-CM | POA: Diagnosis not present

## 2018-02-21 NOTE — Progress Notes (Signed)
[name] [id number] 03-29-1966 52 y.o.  Subjective:   Patient ID:  [name] is a 52 y.o. (DOB 23-Jun-1965) female.  Chief Complaint:  Chief Complaint  Patient presents with  . Depression  . Anxiety  . 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 When last here we attempted to increase nefazadone but she was too sedated and had disrupted vision.  She had stopped lamotrigine on her own bc felt she couldn't see any benefit.  Depression was so bad she wouldn't get OOB or do self care like brush her teeth.  Would sleep excessively.Marland Kitchen  Has restarted [place]  planned 30 and just past 15 so far.  And it's working!  Gradually improved energy, function, concentration, motivation, etc.  Per rating scales she's 50% better.  [place] $125/week.    Long history of severe treatment resistant depression having failed multiple medications including imipramine Latuda paroxetine lithium, nefazodone, olanzapine, quetiapine, doxepin, Lunesta, gabapentin, Viibryd, Zofran, Vyvanse, Rexulti, pramipexole, Abilify, ropinirole, ProSom, Belsomra, Trintellix, Vyvanse, Prozac, Provigil, duloxetine 90 mg, temazepam, Xanax, venlafaxine, Emsam 9 mg, Wellbutrin, Pristiq, lamotrigine, mirtazapine [place]in, TMS, and she did respond to ECT   Review of Systems:  Review of Systems  Gastrointestinal: Positive for abdominal pain.  Neurological: Negative for tremors and weakness.  Psychiatric/Behavioral: Positive for decreased concentration and dysphoric mood. Negative for agitation, behavioral problems, confusion, hallucinations, self-injury, sleep disturbance and suicidal ideas. The patient is not nervous/anxious and is not hyperactive.     Medications: I have reviewed the patient's current medications.  Current Outpatient Medications  Medication Sig Dispense Refill  . ALPRAZolam (XANAX)  1 MG tablet TAKE 1/2 TO 1 TABLET 3 TIMES A DAY AS NEEDED 30 tablet 0  . cholecalciferol (VITAMIN D3) 25 MCG (1000 UT) tablet Take 1,000 Units by mouth daily.    Marland Kitchen lithium carbonate 300 MG capsule TAKE 2 CAPSULES BY MOUTH EVERY DAY 180 capsule 1  . nefazodone (SERZONE) 200 MG tablet Take 200 mg by mouth at bedtime.     Marland Kitchen OLANZapine (ZYPREXA) 5 MG tablet Take 1 tablet (5 mg total) by mouth at bedtime. 90 tablet 0  . omeprazole (PRILOSEC) 20 MG capsule Take 1 capsule (20 mg total) by mouth daily. 90 capsule 3  . Suvorexant (BELSOMRA) 20 MG TABS Take 20 mg by mouth.     No current facility-administered medications for this visit.     Medication Side Effects: None  Allergies: No Known Allergies  Past Medical History:  Diagnosis Date  . Abdominal pain   . Anal itching   . Breast inflammation   . Breast nodule    left  . Chronic kidney disease   . Cystocele   . Decreased libido   . Depression   . Fibroadenoma   . Galactorrhea   . Mastodynia   . Pelvic pain in female   . Pelvic relaxation   . Rectocele   . Serotonin syndrome     Family History  Problem Relation Age of Onset  . Heart disease Mother        angioplasty  . Cancer Father        prostate  . Hypertension Father   . Depression Father   . Bipolar disorder Father   . Rheum arthritis Sister   . Rheum arthritis Brother   . Hypertension Maternal Grandmother   . Diabetes Maternal Grandmother  borderline  . Hypertension Paternal Grandmother   . Arthritis Paternal Grandmother   . Stroke Paternal Grandmother   . Rheum arthritis Paternal Grandfather   . Breast cancer Neg Hx    FHx: sister on olanzapine 5mg  and fluoxetine.  Social History   Socioeconomic History  . Marital status: Married    Spouse name: Not on file  . Number of children: 3  . Years of education: College  . Highest education level: Not on file  Occupational History  . Occupation: Surveyor, mining: Futures trader at [employer]  . Financial resource strain: Not on file  . Food insecurity:    Worry: Not on file    Inability: Not on file  . Transportation needs:    Medical: Not on file    Non-medical: Not on file  Tobacco Use  . Smoking status: Current Every Day Smoker    Packs/day: 0.50    Types: Cigarettes  . Smokeless tobacco: Never Used  Substance and Sexual Activity  . Alcohol use: Yes    Alcohol/week: 1.0 standard drinks    Types: 1 Glasses of wine per week    Comment: one drink about once a month  . Drug use: No  . Sexual activity: Yes    Birth control/protection: Other-see comments    Comment: pt had hyst  Lifestyle  . Physical activity:    Days per week: Not on file    Minutes per session: Not on file  . Stress: Not on file  Relationships  . Social connections:    Talks on phone: Not on file    Gets together: Not on file    Attends religious service: Not on file    Active member of club or organization: Not on file    Attends meetings of clubs or organizations: Not on file    Relationship status: Not on file  . Intimate partner violence:    Fear of current or ex partner: Not on file    Emotionally abused: Not on file    Physically abused: Not on file    Forced sexual activity: Not on file  Other Topics Concern  . Not on file  Social History Narrative  . Not on file    Past Medical History, Surgical history, Social history, and Family history were reviewed and updated as appropriate.   Please see review of systems for further details on the patient's review from today.   Objective:   Physical Exam:  LMP 05/20/2010   Physical Exam  Constitutional: She is oriented to person, place, and time. She appears well-developed. No distress.  Musculoskeletal: She exhibits no deformity.  Neurological: She is alert and oriented to person, place, and time. She displays no tremor. Coordination and gait normal.  Psychiatric: Her speech is normal and behavior is normal. Judgment  and thought content normal. Her mood appears not anxious. Her affect is not angry, not blunt, not labile and not inappropriate. Cognition and memory are normal. She exhibits a depressed mood. She expresses no homicidal and no suicidal ideation. She expresses no suicidal plans and no homicidal plans.  Insight intact and judgment are good. No auditory or visual hallucinations.  .  She is attentive.    Lab Review:     Component Value Date/Time   NA 140 10/10/2015 1115   K 3.6 10/10/2015 1115   CL 102 10/10/2015 1115   CO2 31 10/10/2015 1115   GLUCOSE 102 (H) 10/10/2015 1115  BUN 11 10/10/2015 1115   CREATININE 0.84 10/10/2015 1115   CALCIUM 9.4 10/10/2015 1115   PROT 6.6 10/10/2015 1115   ALBUMIN 4.5 10/10/2015 1115   AST 26 10/10/2015 1115   ALT 21 10/10/2015 1115   ALKPHOS 60 10/10/2015 1115   BILITOT 0.9 10/10/2015 1115   GFRNONAA >60 10/10/2015 1115   GFRAA >60 10/10/2015 1115       Component Value Date/Time   WBC 7.1 11/16/2017 0950   RBC 4.51 11/16/2017 0950   HGB 15.0 11/16/2017 0950   HCT 44.8 11/16/2017 0950   PLT 209.0 11/16/2017 0950   MCV 99.3 11/16/2017 0950   MCH 33.5 10/10/2015 1115   MCHC 33.5 11/16/2017 0950   RDW 13.0 11/16/2017 0950   LYMPHSABS 1.9 11/16/2017 0950   MONOABS 0.5 11/16/2017 0950   EOSABS 0.2 11/16/2017 0950   BASOSABS 0.0 11/16/2017 0950    No results found for: POCLITH, LITHIUM   No results found for: PHENYTOIN, PHENOBARB, VALPROATE, CBMZ   .res Assessment: Plan:    Major depressive disorder, recurrent episode, moderate ([place]) - Plan: Lithium level  Insomnia due to mental condition  Recurrent major depression resistant to treatment ([place])  Low vitamin D level - Plan: Vitamin D 1,25 dihydroxy   Greater than 50% of face to face time with patient was spent on counseling and coordination of care. 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  Reviewed multiple failed medications noted above.  So far with [place] depression has reduced from severe to  moderate.    Check vitamin D and lithium level.  Hx low D can lead to relapse.  Prefer lithium levels of at least 0.6.    Option lithium or retry increasing nefazodone as she gets more used to it.  Hopefully that [place] will be sufficient.  Emphasized the importance of an value of lithium at helping to reduce recurrence rate after procedure such as ECT and [place].  This appt was 30 mins.  FU mid Jan after  [place] finished.  Lynder Parents, MD, DFAPA    Please see After Visit Summary for patient specific instructions.  Future Appointments  Date Time Provider [place]  02/23/2018  9:15 AM Garrel Ridgel, DPM [facility]    Orders Placed This Encounter  Procedures  . Lithium level  . Vitamin D 1,25 dihydroxy      -------------------------------

## 2018-02-22 ENCOUNTER — Other Ambulatory Visit: Payer: Self-pay

## 2018-02-23 ENCOUNTER — Encounter: Payer: Self-pay | Admitting: Podiatry

## 2018-02-23 ENCOUNTER — Ambulatory Visit: Payer: BLUE CROSS/BLUE SHIELD | Admitting: Podiatry

## 2018-02-23 DIAGNOSIS — G5761 Lesion of plantar nerve, right lower limb: Secondary | ICD-10-CM | POA: Diagnosis not present

## 2018-02-23 DIAGNOSIS — G5781 Other specified mononeuropathies of right lower limb: Secondary | ICD-10-CM

## 2018-02-23 NOTE — Progress Notes (Signed)
She presents today for follow-up of neuroma third interspace of the right foot.  States that is doing better and the pain still comes and goes.  Objective: Vital signs stable alert oriented x3.  She has a palpable Mulder's click third interspace of the right foot but much decrease in pain from previous visit.  Assessment: Neuroma third interspace right foot.  95% resolved.  Plan: At this point I injected about 5 mg of Kenalog to the point of maximal tenderness with local anesthetic she tolerated procedure well without complications follow-up with her in 1 month if necessary.

## 2018-03-01 ENCOUNTER — Other Ambulatory Visit: Payer: Self-pay | Admitting: Psychiatry

## 2018-03-06 ENCOUNTER — Other Ambulatory Visit: Payer: Self-pay | Admitting: Psychiatry

## 2018-03-07 NOTE — Telephone Encounter (Signed)
Need to review paper chart  

## 2018-03-07 NOTE — Telephone Encounter (Signed)
Called pt to verify dose.

## 2018-03-14 ENCOUNTER — Other Ambulatory Visit: Payer: Self-pay

## 2018-03-14 ENCOUNTER — Other Ambulatory Visit: Payer: Self-pay | Admitting: Psychiatry

## 2018-03-14 MED ORDER — NEFAZODONE HCL 200 MG PO TABS: 300.0000 mg | ORAL_TABLET | Freq: Every day | ORAL | 2 refills | Status: DC

## 2018-03-14 NOTE — Progress Notes (Signed)
Refill nefazadone at 300 in case she can tolerate an increase later. Cc

## 2018-04-15 ENCOUNTER — Other Ambulatory Visit: Payer: Self-pay | Admitting: Psychiatry

## 2018-04-20 ENCOUNTER — Other Ambulatory Visit: Payer: Self-pay | Admitting: Psychiatry

## 2018-04-25 ENCOUNTER — Ambulatory Visit: Payer: BLUE CROSS/BLUE SHIELD | Admitting: Psychiatry

## 2018-04-25 ENCOUNTER — Encounter: Payer: Self-pay | Admitting: Psychiatry

## 2018-04-25 DIAGNOSIS — F339 Major depressive disorder, recurrent, unspecified: Secondary | ICD-10-CM

## 2018-04-25 DIAGNOSIS — F5105 Insomnia due to other mental disorder: Secondary | ICD-10-CM | POA: Diagnosis not present

## 2018-04-25 DIAGNOSIS — R7989 Other specified abnormal findings of blood chemistry: Secondary | ICD-10-CM | POA: Diagnosis not present

## 2018-04-25 NOTE — Patient Instructions (Addendum)
Cut Zyprexa in 1/2 for wait 1 week. Then increase nefazadone to 2 tablets in evening.  Get vitamin D level and lithium level any morning.

## 2018-04-25 NOTE — Progress Notes (Addendum)
[name] [id number] 1965-04-14 53 y.o.  Subjective:   Patient ID:  [name] is a 53 y.o. (DOB [birth year]) female.  Chief Complaint:  Chief Complaint  Patient presents with  . Depression  . Fatigue  . Medication Problem    HPI last seen February 21, 2018.  Seen with [name]. 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  Sleeping with Belsomra and Xanax.  When last here completed [name] early December.  "It worked"  100% but really tired and sleepy.  Now Dr. Caprice Beaver thinks the Zyprexa is way to sedating.  When last here we attempted to increase nefazadone but she was too sedated and had disrupted vision.  She had stopped lamotrigine on her own bc felt she couldn't see any benefit.  Depression was so bad she wouldn't get OOB or do self care like brush her teeth.  Would sleep excessively.Marland Kitchen  Has restarted [place]  planned 30 and just past 15 so far.  And it's working!  Gradually improved energy, function, concentration, motivation, etc.  Per rating scales she's 50% better.  [place] $125/week.    Long history of severe treatment resistant depression having failed multiple medications including imipramine Latuda paroxetine lithium, nefazodone, olanzapine, quetiapine, doxepin, Lunesta, gabapentin, Viibryd, Zofran, Vyvanse, Rexulti caused NMS, pramipexole, Abilify, ropinirole, ProSom, Belsomra, Trintellix, Vyvanse, Prozac, Provigil, duloxetine 90 mg, temazepam, Xanax, venlafaxine, Emsam 9 mg, Wellbutrin, Pristiq, lamotrigine, mirtazapine Deplin, TMS, and she did respond to ECT.   Review of Systems:  Review of Systems  Constitutional: Positive for fatigue.  Gastrointestinal: Negative for abdominal pain.  Neurological: Negative for tremors and weakness.  Psychiatric/Behavioral: Negative for agitation, behavioral problems, confusion, decreased concentration, dysphoric mood,  hallucinations, self-injury, sleep disturbance and suicidal ideas. The patient is not nervous/anxious and is not hyperactive.     Medications: I have reviewed the patient's current medications.  Current Outpatient Medications  Medication Sig Dispense Refill  . ALPRAZolam (XANAX) 1 MG tablet TAKE 1 TABLET BY MOUTH THREE TIMES A DAY AS NEEDED 30 tablet 2  . BELSOMRA 20 MG TABS TAKE 1 TABLET BY MOUTH AT BEDTIME. 30 tablet 1  . cholecalciferol (VITAMIN D3) 25 MCG (1000 UT) tablet Take 2,000 Units by mouth daily.     Marland Kitchen lithium carbonate 300 MG capsule TAKE 2 CAPSULES BY MOUTH EVERY DAY 180 capsule 1  . nefazodone (SERZONE) 200 MG tablet TAKE 1 AND 1/2 TABLETS BY MOUTH AT BEDTIME. 135 tablet 0  . OLANZapine (ZYPREXA) 5 MG tablet Take 1 tablet (5 mg total) by mouth at bedtime. 90 tablet 0  . omeprazole (PRILOSEC) 20 MG capsule Take 1 capsule (20 mg total) by mouth daily. 90 capsule 3   No current facility-administered medications for this visit.     Medication Side Effects: None  Allergies: No Known Allergies  Past Medical History:  Diagnosis Date  . Abdominal pain   . Anal itching   . Breast inflammation   . Breast nodule    left  . Chronic kidney disease   . Cystocele   . Decreased libido   . Depression   . Fibroadenoma   . Galactorrhea   . Mastodynia   . Pelvic pain in female   . Pelvic relaxation   . Rectocele   . Serotonin syndrome     Family History  Problem Relation Age of Onset  . Heart disease Mother        angioplasty  .  Cancer Father        prostate  . Hypertension Father   . Depression Father   . Bipolar disorder Father   . Rheum arthritis Sister   . Rheum arthritis Brother   . Hypertension Maternal Grandmother   . Diabetes Maternal Grandmother        borderline  . Hypertension Paternal Grandmother   . Arthritis Paternal Grandmother   . Stroke Paternal Grandmother   . Rheum arthritis Paternal Grandfather   . Breast cancer Neg Hx    FHx: sister on  olanzapine 5mg  and fluoxetine.  Social History   Socioeconomic History  . Marital status: Married    Spouse name: Not on file  . Number of children: 3  . Years of education: College  . Highest education level: Not on file  Occupational History  . Occupation: Surveyor, mining: Futures trader at [employer]  . Financial resource strain: Not on file  . Food insecurity:    Worry: Not on file    Inability: Not on file  . Transportation needs:    Medical: Not on file    Non-medical: Not on file  Tobacco Use  . Smoking status: Current Every Day Smoker    Packs/day: 0.50    Types: Cigarettes  . Smokeless tobacco: Never Used  Substance and Sexual Activity  . Alcohol use: Yes    Alcohol/week: 1.0 standard drinks    Types: 1 Glasses of wine per week    Comment: one drink about once a month  . Drug use: No  . Sexual activity: Yes    Birth control/protection: Other-see comments    Comment: pt had hyst  Lifestyle  . Physical activity:    Days per week: Not on file    Minutes per session: Not on file  . Stress: Not on file  Relationships  . Social connections:    Talks on phone: Not on file    Gets together: Not on file    Attends religious service: Not on file    Active member of club or organization: Not on file    Attends meetings of clubs or organizations: Not on file    Relationship status: Not on file  . Intimate partner violence:    Fear of current or ex partner: Not on file    Emotionally abused: Not on file    Physically abused: Not on file    Forced sexual activity: Not on file  Other Topics Concern  . Not on file  Social History Narrative  . Not on file    Past Medical History, Surgical history, Social history, and Family history were reviewed and updated as appropriate.   Please see review of systems for further details on the patient's review from today.   Objective:   Physical Exam:  LMP 05/20/2010   Physical Exam Constitutional:       General: She is not in acute distress.    Appearance: She is well-developed.  Musculoskeletal:        General: No deformity.  Neurological:     Mental Status: She is alert and oriented to person, place, and time.     Motor: No tremor.     Coordination: Coordination normal.     Gait: Gait normal.  Psychiatric:        Attention and Perception: Attention and perception normal. She is attentive.        Mood and Affect: Mood is not anxious or depressed.  Affect is not labile, blunt, angry or inappropriate.        Speech: Speech normal.        Behavior: Behavior normal.        Thought Content: Thought content normal. Thought content does not include homicidal or suicidal ideation. Thought content does not include homicidal or suicidal plan.        Cognition and Memory: Cognition normal.        Judgment: Judgment normal.     Comments: Insight intact and judgment are good. No auditory or visual hallucinations.  .      Lab Review:     Component Value Date/Time   NA 140 10/10/2015 1115   K 3.6 10/10/2015 1115   CL 102 10/10/2015 1115   CO2 31 10/10/2015 1115   GLUCOSE 102 (H) 10/10/2015 1115   BUN 11 10/10/2015 1115   CREATININE 0.84 10/10/2015 1115   CALCIUM 9.4 10/10/2015 1115   PROT 6.6 10/10/2015 1115   ALBUMIN 4.5 10/10/2015 1115   AST 26 10/10/2015 1115   ALT 21 10/10/2015 1115   ALKPHOS 60 10/10/2015 1115   BILITOT 0.9 10/10/2015 1115   GFRNONAA >60 10/10/2015 1115   GFRAA >60 10/10/2015 1115       Component Value Date/Time   WBC 7.1 11/16/2017 0950   RBC 4.51 11/16/2017 0950   HGB 15.0 11/16/2017 0950   HCT 44.8 11/16/2017 0950   PLT 209.0 11/16/2017 0950   MCV 99.3 11/16/2017 0950   MCH 33.5 10/10/2015 1115   MCHC 33.5 11/16/2017 0950   RDW 13.0 11/16/2017 0950   LYMPHSABS 1.9 11/16/2017 0950   MONOABS 0.5 11/16/2017 0950   EOSABS 0.2 11/16/2017 0950   BASOSABS 0.0 11/16/2017 0950    No results found for: POCLITH, LITHIUM   No results found for:  PHENYTOIN, PHENOBARB, VALPROATE, CBMZ   .res Assessment: Plan:    Recurrent major depression resistant to treatment ([place])  Insomnia due to mental condition  Low vitamin D level   Greater than 50% of face to face time with patient was spent on counseling and coordination of care. 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  Reviewed multiple failed medications noted above.  So far with [place] depression has reduced from severe to resolved.  Discussed the risk of relapse.  Check vitamin D and lithium level emphasized.  Hx low D can lead to relapse.  Prefer lithium levels of at least 0.6.  He did not get the labs as requested at the last visit.  This was highly emphasized.   Emphasized the importance of an value of lithium at helping to reduce recurrence rate after procedure such as ECT and [place].  Consider the option of increasing the lithium.  Cut Zyprexa in 1/2 for wait 1 week.  Discussed the significant relapse risk from doing this.  She has relapsed before when she reduce Zyprexa however she was not fully out of depression at that time.  Being fully out of depression does decrease her relapse risk especially if we correspondingly increase the nefazodone. Then increase nefazadone to 2 tablets in evening.  Discussed side effects of each  Discussed potential metabolic side effects associated with atypical antipsychotics, as well as potential risk for movement side effects. Advised pt to contact office if movement side effects occur.   Counseled patient regarding potential benefits, risks, and side effects of lithium to include potential risk of lithium affecting thyroid and renal function.  Discussed need for periodic lab monitoring to determine drug level and  to assess for potential adverse effects.  Counseled patient regarding signs and symptoms of lithium toxicity and advised that they notify office immediately or seek urgent medical attention if experiencing these signs and symptoms.  Patient advised to  contact office with any questions or concerns.  This appt was 30 mins.  FU 6 weeks  Lynder Parents, MD, DFAPA    Please see After Visit Summary for patient specific instructions.  Future Appointments  Date Time Provider [place]  06/08/2018  1:00 PM Cottle, Billey Co., MD CP-CP None    No orders of the defined types were placed in this encounter.  Lithium level received April 27, 2018 was 0.7.  This is probably adequate to help reduce the risk of relapse of depression post [place].  Therefore no med changes.  This result did not show up in epic and therefore is notated at the end of the progress note when the patient was seen last.  Lynder Parents, MD, DFAPA   -------------------------------

## 2018-05-02 LAB — VITAMIN D 1,25 DIHYDROXY
VITAMIN D 1, 25 (OH) TOTAL: 60 pg/mL
Vitamin D3 1, 25 (OH)2: 60 pg/mL

## 2018-05-02 LAB — LITHIUM LEVEL: LITHIUM LVL: 0.7 mmol/L (ref 0.6–1.2)

## 2018-05-03 NOTE — Progress Notes (Signed)
Called patient and let her know that her labs were good and no changes to meds at this time.

## 2018-05-16 ENCOUNTER — Other Ambulatory Visit: Payer: Self-pay | Admitting: Psychiatry

## 2018-05-19 ENCOUNTER — Other Ambulatory Visit: Payer: Self-pay | Admitting: Psychiatry

## 2018-05-30 ENCOUNTER — Telehealth: Payer: Self-pay | Admitting: Psychiatry

## 2018-05-30 NOTE — Telephone Encounter (Signed)
Please give her a call back to discuss.[phone]

## 2018-05-30 NOTE — Telephone Encounter (Signed)
Patient called and said that she was unable to wean off the cyprexa. So she is taking the normal dose. She wanted you to know her next appointment is Monday ,march 5th.

## 2018-06-02 ENCOUNTER — Telehealth: Payer: Self-pay | Admitting: Psychiatry

## 2018-06-03 ENCOUNTER — Other Ambulatory Visit: Payer: Self-pay | Admitting: Psychiatry

## 2018-06-05 NOTE — Telephone Encounter (Signed)
Returned patient's phone call.  She has an appointment in 3 days and state stated the issue could wait until that appointment.

## 2018-06-05 NOTE — Telephone Encounter (Signed)
Patient's phone call was returned.  She says she can wait till discussed that on her appointment in 3 days.

## 2018-06-08 ENCOUNTER — Ambulatory Visit: Payer: BLUE CROSS/BLUE SHIELD | Admitting: Psychiatry

## 2018-06-08 ENCOUNTER — Encounter: Payer: Self-pay | Admitting: Psychiatry

## 2018-06-08 DIAGNOSIS — F411 Generalized anxiety disorder: Secondary | ICD-10-CM

## 2018-06-08 DIAGNOSIS — R7989 Other specified abnormal findings of blood chemistry: Secondary | ICD-10-CM

## 2018-06-08 DIAGNOSIS — F339 Major depressive disorder, recurrent, unspecified: Secondary | ICD-10-CM

## 2018-06-08 DIAGNOSIS — F5105 Insomnia due to other mental disorder: Secondary | ICD-10-CM | POA: Diagnosis not present

## 2018-06-08 MED ORDER — BUPROPION HCL ER (XL) 150 MG PO TB24: ORAL_TABLET | ORAL | 0 refills | Status: DC

## 2018-06-08 MED ORDER — SUVOREXANT 20 MG PO TABS: 1.0000 | ORAL_TABLET | Freq: Every day | ORAL | 5 refills | Status: DC

## 2018-06-08 NOTE — Progress Notes (Signed)
[name] [id number] 1966-02-13 53 y.o.  Subjective:   Patient ID:  [name] is a 53 y.o. (DOB 1965/10/13) female.  Chief Complaint:  Chief Complaint  Patient presents with  . Follow-up    Medication Management  . Depression  . Fatigue    HPI last seen February 21, 2018.  Seen with [name]. 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  Went to 1/2 Zyprexa for 1 month and was fine.  Then 5 days off Zyprexa was a mess with anxiety and couldn't tolerate it so restarted the 1/2 of 5 mg daily.  It's helped, but probably the whole tablet would be best but don't want to take the whole tablet.  Less SE fatigue with the 1/2 tablet and maybe losing a pound or 2. Sleep good.  Sleep more interrupted without the olanzapine.    I don't physically feel good for a couple of months.  Pains in bones and glands hurt.  Saw PCP.  Losing her job which is not good.  Need something to keep me busy.  Has a GF who will hire her temporarily.  Sleeping with Belsomra and Xanax.  When last here completed [place] early December.  "It worked"  100% but really tired and sleepy.  Now Dr. Caprice Beaver thinks the Zyprexa is way to sedating.  When last here we attempted to increase nefazadone but she was too sedated and had disrupted vision.  Nefazadone helps sleep.  Couldn't tolerate the increase even off the Zyprexa.  She had stopped lamotrigine on her own bc felt she couldn't see any benefit.  Depression was so bad she wouldn't get OOB or do self care like brush her teeth.  Would sleep excessively..  Long history of severe treatment resistant depression having failed multiple medications including imipramine Latuda paroxetine lithium, nefazodone, olanzapine, quetiapine, doxepin, Lunesta, gabapentin, Viibryd, Zofran, Vyvanse, Rexulti caused NMS, pramipexole, Abilify, ropinirole, ProSom, Belsomra, Trintellix, Vyvanse,  Prozac, Provigil, duloxetine 90 mg, temazepam, Xanax, venlafaxine, Emsam 9 mg, Wellbutrin, Pristiq, lamotrigine, mirtazapine Deplin, TMS, and she did respond to ECT.   Review of Systems:  Review of Systems  Constitutional: Positive for fatigue.  Gastrointestinal: Negative for abdominal pain.  Neurological: Negative for tremors and weakness.  Psychiatric/Behavioral: Negative for agitation, behavioral problems, confusion, decreased concentration, dysphoric mood, hallucinations, self-injury, sleep disturbance and suicidal ideas. The patient is not nervous/anxious and is not hyperactive.     Medications: I have reviewed the patient's current medications.  Current Outpatient Medications  Medication Sig Dispense Refill  . ALPRAZolam (XANAX) 1 MG tablet TAKE 1 TABLET BY MOUTH THREE TIMES A DAY AS NEEDED FOR ANXIETY. 30 tablet 1  . cholecalciferol (VITAMIN D3) 25 MCG (1000 UT) tablet Take 2,000 Units by mouth daily.     Marland Kitchen lithium carbonate 300 MG capsule TAKE 2 CAPSULES BY MOUTH EVERY DAY 180 capsule 0  . nefazodone (SERZONE) 200 MG tablet TAKE 1 AND 1/2 TABLETS BY MOUTH AT BEDTIME. 135 tablet 0  . OLANZapine (ZYPREXA) 5 MG tablet TAKE 1 TABLET BY MOUTH EVERYDAY AT BEDTIME (Patient taking differently: 2.5 mg. ) 90 tablet 0  . Suvorexant (BELSOMRA) 20 MG TABS Take 1 tablet by mouth at bedtime. 30 tablet 5  . buPROPion (WELLBUTRIN XL) 150 MG 24 hr tablet 1 tablet in the morning for 1 week then 2 or 300 mg each morning 60 tablet 0   No current facility-administered medications for this visit.  Medication Side Effects: None  Allergies: No Known Allergies  Past Medical History:  Diagnosis Date  . Abdominal pain   . Anal itching   . Breast inflammation   . Breast nodule    left  . Chronic kidney disease   . Cystocele   . Decreased libido   . Depression   . Fibroadenoma   . Galactorrhea   . Mastodynia   . Pelvic pain in female   . Pelvic relaxation   . Rectocele   . Serotonin  syndrome     Family History  Problem Relation Age of Onset  . Heart disease Mother        angioplasty  . Cancer Father        prostate  . Hypertension Father   . Depression Father   . Bipolar disorder Father   . Rheum arthritis Sister   . Rheum arthritis Brother   . Hypertension Maternal Grandmother   . Diabetes Maternal Grandmother        borderline  . Hypertension Paternal Grandmother   . Arthritis Paternal Grandmother   . Stroke Paternal Grandmother   . Rheum arthritis Paternal Grandfather   . Breast cancer Neg Hx    FHx: sister on olanzapine 5mg  and fluoxetine.  Social History   Socioeconomic History  . Marital status: Married    Spouse name: Not on file  . Number of children: 3  . Years of education: College  . Highest education level: Not on file  Occupational History  . 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 Financial resource strain: Not on file  . Food insecurity:    Worry: Not on file    Inability: Not on file  . Transportation needs:    Medical: Not on file    Non-medical: Not on file  Tobacco Use  . Smoking status: Current Every Day Smoker    Packs/day: 0.50    Types: Cigarettes  . Smokeless tobacco: Never Used  Substance and Sexual Activity  . Alcohol use: Yes    Alcohol/week: 1.0 standard drinks    Types: 1 Glasses of wine per week    Comment: one drink about once a month  . Drug use: No  . Sexual activity: Yes    Birth control/protection: Other-see comments    Comment: pt had hyst  Lifestyle  . Physical activity:    Days per week: Not on file    Minutes per session: Not on file  . Stress: Not on file  Relationships  . Social connections:    Talks on phone: Not on file    Gets together: Not on file    Attends religious service: Not on file    Active member of club or organization: Not on file    Attends meetings of clubs or organizations: Not on file    Relationship status: Not on file  .  Intimate partner violence:    Fear of current or ex partner: Not on file    Emotionally abused: Not on file    Physically abused: Not on file    Forced sexual activity: Not on file  Other Topics Concern  . Not on file  Social History Narrative  . Not on file    Past Medical History, Surgical history, Social history, and Family history were reviewed and updated as appropriate.   Please see review of systems for further details on the patient's review from today.   Objective:  Physical Exam:  LMP 05/20/2010   Physical Exam Constitutional:      General: She is not in acute distress.    Appearance: She is well-developed.  Musculoskeletal:        General: No deformity.  Neurological:     Mental Status: She is alert and oriented to person, place, and time.     Motor: No tremor.     Coordination: Coordination normal.     Gait: Gait normal.  Psychiatric:        Attention and Perception: Attention and perception normal. She is attentive.        Mood and Affect: Mood is not anxious or depressed. Affect is not labile, blunt, angry or inappropriate.        Speech: Speech normal.        Behavior: Behavior normal.        Thought Content: Thought content normal. Thought content does not include homicidal or suicidal ideation. Thought content does not include homicidal or suicidal plan.        Cognition and Memory: Cognition normal.        Judgment: Judgment normal.     Comments: Insight intact and judgment are good. No auditory or visual hallucinations.  .      Lab Review:     Component Value Date/Time   NA 140 10/10/2015 1115   K 3.6 10/10/2015 1115   CL 102 10/10/2015 1115   CO2 31 10/10/2015 1115   GLUCOSE 102 (H) 10/10/2015 1115   BUN 11 10/10/2015 1115   CREATININE 0.84 10/10/2015 1115   CALCIUM 9.4 10/10/2015 1115   PROT 6.6 10/10/2015 1115   ALBUMIN 4.5 10/10/2015 1115   AST 26 10/10/2015 1115   ALT 21 10/10/2015 1115   ALKPHOS 60 10/10/2015 1115   BILITOT 0.9  10/10/2015 1115   GFRNONAA >60 10/10/2015 1115   GFRAA >60 10/10/2015 1115       Component Value Date/Time   WBC 7.1 11/16/2017 0950   RBC 4.51 11/16/2017 0950   HGB 15.0 11/16/2017 0950   HCT 44.8 11/16/2017 0950   PLT 209.0 11/16/2017 0950   MCV 99.3 11/16/2017 0950   MCH 33.5 10/10/2015 1115   MCHC 33.5 11/16/2017 0950   RDW 13.0 11/16/2017 0950   LYMPHSABS 1.9 11/16/2017 0950   MONOABS 0.5 11/16/2017 0950   EOSABS 0.2 11/16/2017 0950   BASOSABS 0.0 11/16/2017 0950    Lithium Lvl  Date Value Ref Range Status  04/27/2018 0.7 0.6 - 1.2 mmol/L Final    Comment:                                     Detection Limit = 0.1                           <0.1 indicates None Detected      No results found for: PHENYTOIN, PHENOBARB, VALPROATE, CBMZ   Lithium level received April 27, 2018 was 0.7.  This is probably adequate to help reduce the risk of relapse of depression post [place].  Therefore no med changes.   .res Assessment: Plan:    Recurrent major depression resistant to treatment ([place]) - Plan: buPROPion (WELLBUTRIN XL) 150 MG 24 hr tablet  Insomnia due to mental condition  Low vitamin D level  Generalized anxiety disorder   Greater than 50% of face to face time with  patient was spent on counseling and coordination of care. 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  Reviewed multiple failed medications noted above.  She failed the attempt to wean Zyprexa because of worsening anxiety.  Anxiety is managed at 2.5 mg Zyprexa daily.  So far with [place] depression has reduced from severe to resolved.  Discussed the risk of relapse.  She wants to start Wellbutrin for potentiation for relapse prevention.  Discussed the side effects of it.  She took it but is been a very long time ago.  Start Wellbutrin XL 150 mg each morning for 1 week then 300 mg each morning  Not eating much.  Get more protein otherwise meds won't work.  Emphasized the importance of an value of lithium at helping to reduce  recurrence rate after procedure such as ECT and [place].  Consider the option of increasing the lithium.  Discussed potential metabolic side effects associated with atypical antipsychotics, as well as potential risk for movement side effects. Advised pt to contact office if movement side effects occur.   Counseled patient regarding potential benefits, risks, and side effects of lithium to include potential risk of lithium affecting thyroid and renal function.  Discussed need for periodic lab monitoring to determine drug level and to assess for potential adverse effects.  Counseled patient regarding signs and symptoms of lithium toxicity and advised that they notify office immediately or seek urgent medical attention if experiencing these signs and symptoms.  Patient advised to contact office with any questions or concerns.  Have not received the discharge summary from Dr. Caprice Beaver at [facility].  Have requested this before but will re-requested  This appt was 30 mins.  FU 6 weeks  Lynder Parents, MD, DFAPA    Please see After Visit Summary for patient specific instructions.  No future appointments.  No orders of the defined types were placed in this encounter.    Lynder Parents, MD, DFAPA   -------------------------------

## 2018-06-13 ENCOUNTER — Other Ambulatory Visit: Payer: Self-pay | Admitting: Psychiatry

## 2018-06-30 ENCOUNTER — Other Ambulatory Visit: Payer: Self-pay | Admitting: Psychiatry

## 2018-06-30 DIAGNOSIS — F339 Major depressive disorder, recurrent, unspecified: Secondary | ICD-10-CM

## 2018-07-10 ENCOUNTER — Other Ambulatory Visit: Payer: Self-pay | Admitting: Psychiatry

## 2018-07-14 ENCOUNTER — Other Ambulatory Visit: Payer: Self-pay | Admitting: Psychiatry

## 2018-07-14 DIAGNOSIS — F339 Major depressive disorder, recurrent, unspecified: Secondary | ICD-10-CM

## 2018-07-20 ENCOUNTER — Ambulatory Visit: Payer: BLUE CROSS/BLUE SHIELD | Admitting: Psychiatry

## 2018-07-22 ENCOUNTER — Other Ambulatory Visit: Payer: Self-pay | Admitting: Psychiatry

## 2018-07-26 ENCOUNTER — Other Ambulatory Visit: Payer: Self-pay | Admitting: Psychiatry

## 2018-07-31 ENCOUNTER — Other Ambulatory Visit: Payer: Self-pay | Admitting: Psychiatry

## 2018-07-31 DIAGNOSIS — F339 Major depressive disorder, recurrent, unspecified: Secondary | ICD-10-CM

## 2018-07-31 MED ORDER — BUPROPION HCL ER (XL) 150 MG PO TB24: ORAL_TABLET | ORAL | 0 refills | Status: DC

## 2018-08-10 ENCOUNTER — Other Ambulatory Visit: Payer: Self-pay | Admitting: Psychiatry

## 2018-08-16 ENCOUNTER — Telehealth: Payer: Self-pay | Admitting: Psychiatry

## 2018-08-16 ENCOUNTER — Other Ambulatory Visit: Payer: Self-pay | Admitting: Psychiatry

## 2018-08-16 DIAGNOSIS — F339 Major depressive disorder, recurrent, unspecified: Secondary | ICD-10-CM

## 2018-08-16 NOTE — Telephone Encounter (Signed)
Actually am sending it to both [facility] she can go to either 1

## 2018-08-16 NOTE — Telephone Encounter (Signed)
Just wanted to make you aware I spoke to her husband Pilar Plate and he had her hold her lithium last night 05/12 and is holding it again tonight 05/13 due to her symptoms. He felt they were severe enough, she will get labs drawn as ordered on 05/14. He said she does seem better today since holding dose last night. Wanted to make you aware depending on her level. Informed him we would call with her results as soon as available.

## 2018-08-16 NOTE — Telephone Encounter (Signed)
Pt aware and will go to [facility] in the morning.

## 2018-08-16 NOTE — Telephone Encounter (Signed)
Take lithium as normal today.  Get lithium level in the morning and then do not take lithium tomorrow after the level.  I am sending it to [facility] laboratory

## 2018-08-16 NOTE — Telephone Encounter (Signed)
Patient calld and said that she thinks she is having a reaction to the lithium. She is shaking, walking funny,and has no appetitie. These are just a few of them. Her necxt appt is 6/4. Please call her at [phone]

## 2018-08-17 ENCOUNTER — Other Ambulatory Visit: Payer: Self-pay | Admitting: Psychiatry

## 2018-08-17 ENCOUNTER — Telehealth: Payer: Self-pay | Admitting: Psychiatry

## 2018-08-17 NOTE — Telephone Encounter (Signed)
Patient's spouse left vm today @ 11:23 wanting to give time between last dose and test this morning of Lithium was 62 hours

## 2018-08-18 ENCOUNTER — Other Ambulatory Visit: Payer: Self-pay | Admitting: Psychiatry

## 2018-08-18 ENCOUNTER — Telehealth: Payer: Self-pay | Admitting: Psychiatry

## 2018-08-18 LAB — LITHIUM LEVEL: Lithium Lvl: 0.4 mmol/L — ABNORMAL LOW (ref 0.6–1.2)

## 2018-08-18 NOTE — Telephone Encounter (Signed)
[name] called to says that when test results come back in she wants to result called to her NOT her husband.  She knows he called yesterday about them, but wants it clear that the results be given to her.

## 2018-08-18 NOTE — Telephone Encounter (Signed)
Return telephone call to [facility] about her lithium level. The level was 0.4  62 hours after her last dosage.  She had been having some signs and symptoms apparently that gradually increased of lithium toxicity prior to stopping the lithium.  I left the message that I would assume at this point the lithium toxicity symptoms have resolved.  If they have restart lithium at half previous dose or 300 mg daily.  We will follow-up with her next week to investigate potential causes for her lithium level to go up such as weight loss or drug drug interactions.

## 2018-08-23 NOTE — Telephone Encounter (Signed)
Call the patient and verify that she got my message from May 15 to reduce lithium to 300 mg daily and verify that her side effects or symptoms that she was having have resolved.  Hears the text from the telephone message I left May 15: Note    Return telephone call to [facility] about her lithium level. The level was 0.4  62 hours after her last dosage.  She had been having some signs and symptoms apparently that gradually increased of lithium toxicity prior to stopping the lithium.  I left the message that I would assume at this point the lithium toxicity symptoms have resolved.  If they have restart lithium at half previous dose or 300 mg daily.  We will follow-up with her next week to investigate potential causes for her lithium level to go up such as weight loss or drug drug interactions.     Lynder Parents, MD, DFAPA

## 2018-08-24 NOTE — Telephone Encounter (Signed)
Spoke with pt. And she stated she did get your message. She said she is doing much better and the side effects have went away.

## 2018-09-07 ENCOUNTER — Ambulatory Visit: Payer: BLUE CROSS/BLUE SHIELD | Admitting: Psychiatry

## 2018-09-07 ENCOUNTER — Encounter: Payer: Self-pay | Admitting: Psychiatry

## 2018-09-07 ENCOUNTER — Other Ambulatory Visit: Payer: Self-pay

## 2018-09-07 DIAGNOSIS — F411 Generalized anxiety disorder: Secondary | ICD-10-CM

## 2018-09-07 DIAGNOSIS — R7989 Other specified abnormal findings of blood chemistry: Secondary | ICD-10-CM | POA: Diagnosis not present

## 2018-09-07 DIAGNOSIS — F339 Major depressive disorder, recurrent, unspecified: Secondary | ICD-10-CM | POA: Diagnosis not present

## 2018-09-07 DIAGNOSIS — F5105 Insomnia due to other mental disorder: Secondary | ICD-10-CM | POA: Diagnosis not present

## 2018-09-07 MED ORDER — BUPROPION HCL ER (SR) 100 MG PO TB12: ORAL_TABLET | ORAL | 1 refills | Status: DC

## 2018-09-07 NOTE — Progress Notes (Signed)
[name] [id number] 1966-01-05 53 y.o.  Subjective:   Patient ID:  [name] is a 53 y.o. (DOB 05/21/1965) female.  Chief Complaint:  Chief Complaint  Patient presents with  . Follow-up    Medication Management  . Anxiety    Medication Management  . 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  Last visit was March 2020.  She had completed [place] early December.  "It worked"  and had resolution of her depression!  She started Wellbutrin XL 300 mg daily for relapse prevention.  She continued olanzapine 2.5 mg every afternoon because her anxiety relapsed when she would try to completely discontinue it.  Wellbutrin helped energy some but 300 too much felt edgy and affected sleep.  Reduced to 150 mg.  Caffeine energy drink and 2 cups coffee large daily.    Stopped lithium DT SE and felt worse and restarted 300 daily.   Depression 6-7/10.  Anxiety 6/10. Stays busy and active.  Exercising still.  Lost weight with Wellbutrin.  Jobless not good.  Makes her nervous.  But kids around.  Function is ok.   Sleeping with Belsomra and Xanax. 9-10 hours  CO dry mouth and cavities.  Metal taste in her mouth.  Easily confused.  Might drive to the wrong place.  CO poor memory.  Forgot about which weddings she was in.  Has to write everything down. Anxiety about accidents.  Still has SI without intent or plan.  No libido is terrible.  Long history of severe treatment resistant depression having failed multiple medications including imipramine Latuda paroxetine lithium, nefazodone, olanzapine, quetiapine, doxepin, Lunesta, gabapentin, Viibryd, Zofran, Vyvanse, Rexulti caused NMS, pramipexole, Abilify, ropinirole, ProSom, Belsomra, Trintellix, Vyvanse, Prozac, Provigil, duloxetine 90 mg, temazepam, Xanax, venlafaxine, Emsam 9 mg, Wellbutrin, Pristiq,  lamotrigine ? response, mirtazapine Deplin, TMS, and she did respond to ECT., nefazodone 600 sedation, lithium, NAC  Review of Systems:  Review of Systems  Constitutional: Positive for fatigue.  Gastrointestinal: Negative for abdominal pain.  Neurological: Negative for tremors and weakness.  Psychiatric/Behavioral: Positive for dysphoric mood. Negative for agitation, behavioral problems, confusion, decreased concentration, hallucinations, self-injury, sleep disturbance and suicidal ideas. The patient is nervous/anxious. The patient is not hyperactive.     Medications: I have reviewed the patient's current medications.  Current Outpatient Medications  Medication Sig Dispense Refill  . ALPRAZolam (XANAX) 1 MG tablet TAKE 1 TABLET BY MOUTH 3 TIMES A DAY AS NEEDED FOR ANXIETY 30 tablet 2  . buPROPion (WELLBUTRIN XL) 150 MG 24 hr tablet TAKE 2 TABLETS BY MOUTH EVERY DAY IN THE MORNING. (Patient taking differently: Take 150 mg by mouth daily. ) 180 tablet 0  . cholecalciferol (VITAMIN D3) 25 MCG (1000 UT) tablet Take 2,000 Units by mouth daily.     Marland Kitchen lithium carbonate 300 MG capsule TAKE 2 CAPSULES BY MOUTH EVERY DAY (Patient taking differently: Take 300 mg by mouth at bedtime. ) 180 capsule 0  . nefazodone (SERZONE) 200 MG tablet TAKE 1 AND 1/2 TABLETS BY MOUTH EVERY DAY AT BEDTIME. (Patient taking differently: Take 200 mg by mouth at bedtime. ) 135 tablet 1  . OLANZapine (ZYPREXA) 5 MG tablet TAKE 1 TABLET BY MOUTH EVERYDAY AT BEDTIME (Patient taking differently: Take 2.5 mg by mouth at bedtime. ) 90 tablet 1  . Suvorexant (BELSOMRA) 20 MG TABS Take 1 tablet by mouth at  bedtime. 30 tablet 5   No current facility-administered medications for this visit.     Medication Side Effects: None  Allergies: No Known Allergies  Past Medical History:  Diagnosis Date  . Abdominal pain   . Anal itching   . Breast inflammation   . Breast nodule    left  . Chronic kidney disease   . Cystocele   .  Decreased libido   . Depression   . Fibroadenoma   . Galactorrhea   . Mastodynia   . Pelvic pain in female   . Pelvic relaxation   . Rectocele   . Serotonin syndrome     Family History  Problem Relation Age of Onset  . Heart disease Mother        angioplasty  . Cancer Father        prostate  . Hypertension Father   . Depression Father   . Bipolar disorder Father   . Rheum arthritis Sister   . Rheum arthritis Brother   . Hypertension Maternal Grandmother   . Diabetes Maternal Grandmother        borderline  . Hypertension Paternal Grandmother   . Arthritis Paternal Grandmother   . Stroke Paternal Grandmother   . Rheum arthritis Paternal Grandfather   . Breast cancer Neg Hx    FHx: sister on olanzapine 5mg  and fluoxetine.  Social History   Socioeconomic History  . Marital status: Married    Spouse name: Not on file  . Number of children: 3  . Years of education: College  . Highest education level: Not on file  Occupational History  . Occupation: Surveyor, mining: Futures trader at [employer]  . Financial resource strain: Not on file  . Food insecurity:    Worry: Not on file    Inability: Not on file  . Transportation needs:    Medical: Not on file    Non-medical: Not on file  Tobacco Use  . Smoking status: Current Every Day Smoker    Packs/day: 0.50    Types: Cigarettes  . Smokeless tobacco: Never Used  Substance and Sexual Activity  . Alcohol use: Yes    Alcohol/week: 1.0 standard drinks    Types: 1 Glasses of wine per week    Comment: one drink about once a month  . Drug use: No  . Sexual activity: Yes    Birth control/protection: Other-see comments    Comment: pt had hyst  Lifestyle  . Physical activity:    Days per week: Not on file    Minutes per session: Not on file  . Stress: Not on file  Relationships  . Social connections:    Talks on phone: Not on file    Gets together: Not on file    Attends religious service: Not  on file    Active member of club or organization: Not on file    Attends meetings of clubs or organizations: Not on file    Relationship status: Not on file  . Intimate partner violence:    Fear of current or ex partner: Not on file    Emotionally abused: Not on file    Physically abused: Not on file    Forced sexual activity: Not on file  Other Topics Concern  . Not on file  Social History Narrative  . Not on file    Past Medical History, Surgical history, Social history, and Family history were reviewed and updated as appropriate.  Please see review of systems for further details on the patient's review from today.   Objective:   Physical Exam:  LMP 05/20/2010   Physical Exam Constitutional:      General: She is not in acute distress.    Appearance: She is well-developed.  Musculoskeletal:        General: No deformity.  Neurological:     Mental Status: She is alert and oriented to person, place, and time.     Motor: No tremor.     Coordination: Coordination normal.     Gait: Gait normal.  Psychiatric:        Attention and Perception: Attention and perception normal. She is attentive.        Mood and Affect: Mood is anxious and depressed. Affect is not labile, blunt, angry or inappropriate.        Speech: Speech normal.        Behavior: Behavior normal.        Thought Content: Thought content normal. Thought content does not include homicidal or suicidal ideation. Thought content does not include homicidal or suicidal plan.        Cognition and Memory: Cognition normal.        Judgment: Judgment normal.     Comments: Insight intact and judgment are good. No auditory or visual hallucinations.  .      Lab Review:     Component Value Date/Time   NA 140 10/10/2015 1115   K 3.6 10/10/2015 1115   CL 102 10/10/2015 1115   CO2 31 10/10/2015 1115   GLUCOSE 102 (H) 10/10/2015 1115   BUN 11 10/10/2015 1115   CREATININE 0.84 10/10/2015 1115   CALCIUM 9.4 10/10/2015  1115   PROT 6.6 10/10/2015 1115   ALBUMIN 4.5 10/10/2015 1115   AST 26 10/10/2015 1115   ALT 21 10/10/2015 1115   ALKPHOS 60 10/10/2015 1115   BILITOT 0.9 10/10/2015 1115   GFRNONAA >60 10/10/2015 1115   GFRAA >60 10/10/2015 1115       Component Value Date/Time   WBC 7.1 11/16/2017 0950   RBC 4.51 11/16/2017 0950   HGB 15.0 11/16/2017 0950   HCT 44.8 11/16/2017 0950   PLT 209.0 11/16/2017 0950   MCV 99.3 11/16/2017 0950   MCH 33.5 10/10/2015 1115   MCHC 33.5 11/16/2017 0950   RDW 13.0 11/16/2017 0950   LYMPHSABS 1.9 11/16/2017 0950   MONOABS 0.5 11/16/2017 0950   EOSABS 0.2 11/16/2017 0950   BASOSABS 0.0 11/16/2017 0950    Lithium Lvl  Date Value Ref Range Status  08/17/2018 0.4 (L) 0.6 - 1.2 mmol/L Final    Comment:                                     Detection Limit = 0.1                           <0.1 indicates None Detected      No results found for: PHENYTOIN, PHENOBARB, VALPROATE, CBMZ   Lithium level received April 27, 2018 was 0.7.  This is probably adequate to help reduce the risk of relapse of depression post [place].  Therefore no med changes.   .res Assessment: Plan:    Recurrent major depression resistant to treatment ([place])  Generalized anxiety disorder  Insomnia due to mental condition  Low vitamin D level  Greater than 50% of face to face time with patient was spent on counseling and coordination of care. 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  Reviewed multiple failed medications noted above.  She failed the attempt to wean Zyprexa because of worsening anxiety.  Anxiety is managed at 2.5 mg Zyprexa daily.  Option increase nefazodone again.  Some compliance issues. She doesn't like taking so much meds.  Switch Wellbutrin to SR 100 2 daily for max effect and to keep it from affecting sleep.   She might tolerate it better if cut back on caffeine.    Restart NAC 600 mg or more daily.  Not eating much.  Get more protein otherwise meds won't  work.  Emphasized the importance of an value of lithium at helping to reduce recurrence rate after procedure such as ECT and [place].  Consider [place].  Disc each in detail.  Consider the option of increasing the lithium.  Discussed potential metabolic side effects associated with atypical antipsychotics, as well as potential risk for movement side effects. Advised pt to contact office if movement side effects occur.   Counseled patient regarding potential benefits, risks, and side effects of lithium to include potential risk of lithium affecting thyroid and renal function.  Discussed need for periodic lab monitoring to determine drug level and to assess for potential adverse effects.  Counseled patient regarding signs and symptoms of lithium toxicity and advised that they notify office immediately or seek urgent medical attention if experiencing these signs and symptoms.  Patient advised to contact office with any questions or concerns.  Disc lithium and toxicity.  This appt was 40 mins.  FU 6 weeks  Lynder Parents, MD, DFAPA    Please see After Visit Summary for patient specific instructions.  No future appointments.  No orders of the defined types were placed in this encounter.    Lynder Parents, MD, DFAPA   -------------------------------

## 2018-09-07 NOTE — Patient Instructions (Signed)
Stop bupropion XL. Start bupropion SR 1 in morning for a week then 2 in morning.  Reduce caffeine to better tolerate the bupropion  Restart NAC 600 mg 2 daily for memory  Get more protein

## 2018-09-12 ENCOUNTER — Other Ambulatory Visit: Payer: Self-pay | Admitting: Psychiatry

## 2018-09-20 ENCOUNTER — Telehealth: Payer: Self-pay | Admitting: Psychiatry

## 2018-09-20 NOTE — Telephone Encounter (Signed)
Lithium is a type of metal and its most likely the cause.  Drinking more water can sometimes reduce this side effect.  I realize she probably already drinks a good amount of water but being sure of that is helpful.

## 2018-09-20 NOTE — Telephone Encounter (Signed)
Patient wants to know if there is any medication that she is taking causes her to have a metal taste in her mouth?  Please advise

## 2018-09-21 NOTE — Telephone Encounter (Signed)
Her lithium level was low normal at 0.4.  Going any lower is not practical.  No med changes

## 2018-10-25 ENCOUNTER — Other Ambulatory Visit: Payer: Self-pay | Admitting: Psychiatry

## 2018-10-30 ENCOUNTER — Other Ambulatory Visit: Payer: Self-pay | Admitting: Psychiatry

## 2018-10-30 DIAGNOSIS — F339 Major depressive disorder, recurrent, unspecified: Secondary | ICD-10-CM

## 2018-11-06 ENCOUNTER — Encounter: Payer: Self-pay | Admitting: Psychiatry

## 2018-11-06 ENCOUNTER — Ambulatory Visit (INDEPENDENT_AMBULATORY_CARE_PROVIDER_SITE_OTHER): Payer: BC Managed Care – PPO | Admitting: Psychiatry

## 2018-11-06 ENCOUNTER — Other Ambulatory Visit: Payer: Self-pay

## 2018-11-06 DIAGNOSIS — F339 Major depressive disorder, recurrent, unspecified: Secondary | ICD-10-CM | POA: Diagnosis not present

## 2018-11-06 DIAGNOSIS — F5105 Insomnia due to other mental disorder: Secondary | ICD-10-CM

## 2018-11-06 DIAGNOSIS — R7989 Other specified abnormal findings of blood chemistry: Secondary | ICD-10-CM | POA: Diagnosis not present

## 2018-11-06 DIAGNOSIS — F411 Generalized anxiety disorder: Secondary | ICD-10-CM

## 2018-11-06 MED ORDER — BELSOMRA 20 MG PO TABS: 1.0000 | ORAL_TABLET | Freq: Every day | ORAL | 5 refills | Status: DC

## 2018-11-06 MED ORDER — ALPRAZOLAM 1 MG PO TABS: 1.5000 mg | ORAL_TABLET | Freq: Every evening | ORAL | 5 refills | Status: DC | PRN

## 2018-11-06 MED ORDER — LITHIUM CARBONATE 300 MG PO CAPS: 600.0000 mg | ORAL_CAPSULE | Freq: Every day | ORAL | 0 refills | Status: DC

## 2018-11-06 MED ORDER — OLANZAPINE 5 MG PO TABS: ORAL_TABLET | ORAL | 1 refills | Status: DC

## 2018-11-06 MED ORDER — BUPROPION HCL ER (SR) 100 MG PO TB12: 200.0000 mg | ORAL_TABLET | ORAL | 0 refills | Status: DC

## 2018-11-06 NOTE — Progress Notes (Signed)
[name] [id number] Jun 26, 1965 53 y.o.  Subjective:   Patient ID:  [name] is a 53 y.o. (DOB Jun 14, 1965) female.  Chief Complaint:  Chief Complaint  Patient presents with  . Follow-up    Medication Management  . Depression    Medication Management  . Anxiety    Medication Management  . 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  At visit March 2020.  She had completed [place] early December.  "It worked"  and had resolution of her depression!  She started Wellbutrin XL 300 mg daily for relapse prevention.  She continued olanzapine 2.5 mg every afternoon because her anxiety relapsed when she would try to completely discontinue it.  Stopped lithium DT SE and felt worse and restarted 300 daily.   Last seen in June.  Because Wellbutrin had affected her sleep we switched it to Wellbutrin SR 200 mg every morning and encouraged her to reduce caffeine.  She had felt well but [place] XL 300 mg made her edgy and caused some insomnia.  She was also encouraged to take NAC as a supplement daily.  Restarted [place] and about 1/2 through and feels much better.  Seeing [name] at [facility].  .  Mostly resolved with this tx and finishes */18.  Energy is better and thinks Wellbutrin has helped.  Tolerating [place].  Sleep better with short-acting Wellbutrin.  Depression very low.  Anxiety is still moderate. Stays busy and active.  Exercising still.  Lost weight with Wellbutrin.  Jobless not good.  Makes her nervous.  But kids around.  Function is ok.   Sleeping with Belsomra and Xanax. 9-10 hours  CO dry mouth and cavities.  Metal taste in her mouth.  Easily confused.  Might drive to the wrong place.  CO poor memory.  Forgot about which weddings she was in.  Has to write everything down. Anxiety about accidents.  Still has SI without intent or  plan.  No libido is terrible.  Long history of severe treatment resistant depression having failed multiple medications including imipramine Latuda paroxetine lithium, nefazodone, olanzapine, quetiapine, doxepin, Lunesta, gabapentin, Viibryd, Zofran, Vyvanse, Rexulti caused NMS, pramipexole, Abilify, ropinirole, ProSom, Belsomra, Trintellix, Vyvanse, Prozac, Provigil, duloxetine 90 mg, temazepam, Xanax, venlafaxine, Emsam 9 mg, Wellbutrin, Pristiq, lamotrigine ? response, mirtazapine Deplin, TMS, and she did respond to ECT., nefazodone 600 sedation, lithium, NAC  Review of Systems:  Review of Systems  Constitutional: Positive for fatigue.  Gastrointestinal: Negative for abdominal pain.  Neurological: Negative for tremors and weakness.  Psychiatric/Behavioral: Positive for dysphoric mood. Negative for agitation, behavioral problems, confusion, decreased concentration, hallucinations, self-injury, sleep disturbance and suicidal ideas. The patient is nervous/anxious. The patient is not hyperactive.     Medications: I have reviewed the patient's current medications.  Current Outpatient Medications  Medication Sig Dispense Refill  . ALPRAZolam (XANAX) 1 MG tablet TAKE 1 TABLET BY MOUTH 3 TIMES A DAY AS NEEDED FOR ANXIETY 30 tablet 2  . buPROPion (WELLBUTRIN SR) 100 MG 12 hr tablet TAKE 1 TABLET EVERY MORNING FOR 1 WEEK THEN TAKE 2 TABLETS EVERY MORNING 180 tablet 0  . cholecalciferol (VITAMIN D3) 25 MCG (1000 UT) tablet Take 2,000 Units by mouth daily.     Marland Kitchen lithium carbonate 300 MG capsule TAKE 2 CAPSULES BY MOUTH EVERY DAY 180 capsule 0  . nefazodone (SERZONE) 200 MG tablet  TAKE 1 AND 1/2 TABLETS BY MOUTH EVERY DAY AT BEDTIME. (Patient taking differently: Take 200 mg by mouth at bedtime. ) 135 tablet 1  . OLANZapine (ZYPREXA) 5 MG tablet TAKE 1 TABLET BY MOUTH EVERYDAY AT BEDTIME (Patient taking differently: Take 2.5 mg by mouth at bedtime. ) 90 tablet 1  . Suvorexant (BELSOMRA) 20 MG TABS Take 1  tablet by mouth at bedtime. 30 tablet 5   No current facility-administered medications for this visit.     Medication Side Effects: None  Allergies: No Known Allergies  Past Medical History:  Diagnosis Date  . Abdominal pain   . Anal itching   . Breast inflammation   . Breast nodule    left  . Chronic kidney disease   . Cystocele   . Decreased libido   . Depression   . Fibroadenoma   . Galactorrhea   . Mastodynia   . Pelvic pain in female   . Pelvic relaxation   . Rectocele   . Serotonin syndrome     Family History  Problem Relation Age of Onset  . Heart disease Mother        angioplasty  . Cancer Father        prostate  . Hypertension Father   . Depression Father   . Bipolar disorder Father   . Rheum arthritis Sister   . Rheum arthritis Brother   . Hypertension Maternal Grandmother   . Diabetes Maternal Grandmother        borderline  . Hypertension Paternal Grandmother   . Arthritis Paternal Grandmother   . Stroke Paternal Grandmother   . Rheum arthritis Paternal Grandfather   . Breast cancer Neg Hx    FHx: sister on olanzapine 5mg  and fluoxetine.  Social History   Socioeconomic History  . Marital status: Married    Spouse name: Not on file  . Number of children: 3  . Years of education: College  . Highest education level: Not on file  Occupational History  . Occupation: Surveyor, mining: Futures trader at [employer]  . Financial resource strain: Not on file  . Food insecurity    Worry: Not on file    Inability: Not on file  . Transportation needs    Medical: Not on file    Non-medical: Not on file  Tobacco Use  . Smoking status: Current Every Day Smoker    Packs/day: 0.50    Types: Cigarettes  . Smokeless tobacco: Never Used  Substance and Sexual Activity  . Alcohol use: Yes    Alcohol/week: 1.0 standard drinks    Types: 1 Glasses of wine per week    Comment: one drink about once a month  . Drug use: No  . Sexual  activity: Yes    Birth control/protection: Other-see comments    Comment: pt had hyst  Lifestyle  . Physical activity    Days per week: Not on file    Minutes per session: Not on file  . Stress: Not on file  Relationships  . Social Herbalist on phone: Not on file    Gets together: Not on file    Attends religious service: Not on file    Active member of club or organization: Not on file    Attends meetings of clubs or organizations: Not on file    Relationship status: Not on file  . Intimate partner violence    Fear of current or ex partner:  Not on file    Emotionally abused: Not on file    Physically abused: Not on file    Forced sexual activity: Not on file  Other Topics Concern  . Not on file  Social History Narrative  . Not on file    Past Medical History, Surgical history, Social history, and Family history were reviewed and updated as appropriate.   Please see review of systems for further details on the patient's review from today.   Objective:   Physical Exam:  LMP 05/20/2010   Physical Exam Constitutional:      General: She is not in acute distress.    Appearance: She is well-developed.  Musculoskeletal:        General: No deformity.  Neurological:     Mental Status: She is alert and oriented to person, place, and time.     Motor: No tremor.     Coordination: Coordination normal.     Gait: Gait normal.  Psychiatric:        Attention and Perception: Attention and perception normal. She is attentive.        Mood and Affect: Mood is anxious. Mood is not depressed. Affect is not labile, blunt, angry or inappropriate.        Speech: Speech normal.        Behavior: Behavior normal.        Thought Content: Thought content normal. Thought content does not include homicidal or suicidal ideation. Thought content does not include homicidal or suicidal plan.        Cognition and Memory: Cognition normal.        Judgment: Judgment normal.     Comments:  Insight intact and judgment are good. No auditory or visual hallucinations.  .      Lab Review:     Component Value Date/Time   NA 140 10/10/2015 1115   K 3.6 10/10/2015 1115   CL 102 10/10/2015 1115   CO2 31 10/10/2015 1115   GLUCOSE 102 (H) 10/10/2015 1115   BUN 11 10/10/2015 1115   CREATININE 0.84 10/10/2015 1115   CALCIUM 9.4 10/10/2015 1115   PROT 6.6 10/10/2015 1115   ALBUMIN 4.5 10/10/2015 1115   AST 26 10/10/2015 1115   ALT 21 10/10/2015 1115   ALKPHOS 60 10/10/2015 1115   BILITOT 0.9 10/10/2015 1115   GFRNONAA >60 10/10/2015 1115   GFRAA >60 10/10/2015 1115       Component Value Date/Time   WBC 7.1 11/16/2017 0950   RBC 4.51 11/16/2017 0950   HGB 15.0 11/16/2017 0950   HCT 44.8 11/16/2017 0950   PLT 209.0 11/16/2017 0950   MCV 99.3 11/16/2017 0950   MCH 33.5 10/10/2015 1115   MCHC 33.5 11/16/2017 0950   RDW 13.0 11/16/2017 0950   LYMPHSABS 1.9 11/16/2017 0950   MONOABS 0.5 11/16/2017 0950   EOSABS 0.2 11/16/2017 0950   BASOSABS 0.0 11/16/2017 0950    Lithium Lvl  Date Value Ref Range Status  08/17/2018 0.4 (L) 0.6 - 1.2 mmol/L Final    Comment:                                     Detection Limit = 0.1                           <0.1 indicates None Detected      No results  found for: PHENYTOIN, PHENOBARB, VALPROATE, CBMZ   Lithium level received April 27, 2018 was 0.7.  This is probably adequate to help reduce the risk of relapse of depression post [place].  Therefore no med changes.    Vitamin D level April 27, 2018 on supplement was 60 and adequate. .res Assessment: Plan:    Keyonni was seen today for follow-up, depression, anxiety and medication refill.  Diagnoses and all orders for this visit:  Recurrent major depression resistant to treatment ([place])  Generalized anxiety disorder  Insomnia due to mental condition  Low vitamin D level  Greater than 50% of face to face time with patient was spent on counseling and coordination of  care. 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  Reviewed multiple failed medications noted above.  She failed the attempt to wean Zyprexa because of worsening anxiety.  Anxiety is managed at 2.5 mg Zyprexa daily.  Option increase nefazodone again.  Some compliance issues. She doesn't like taking so much meds.  Continue Wellbutrin to SR 100 2 daily for max effect and to keep it from affecting sleep.   She might tolerate it better if cut back on caffeine.    Defer per her request NAC 600 mg or more daily.  Lost a size.  Not eating much.  Get more protein otherwise meds won't work.  Continue lithium even a low dose may be helpful.  Discussed potential metabolic side effects associated with atypical antipsychotics, as well as potential risk for movement side effects. Advised pt to contact office if movement side effects occur.   Counseled patient regarding potential benefits, risks, and side effects of lithium to include potential risk of lithium affecting thyroid and renal function.  Discussed need for periodic lab monitoring to determine drug level and to assess for potential adverse effects.  Counseled patient regarding signs and symptoms of lithium toxicity and advised that they notify office immediately or seek urgent medical attention if experiencing these signs and symptoms.  Patient advised to contact office with any questions or concerns.  Disc lithium and toxicity.  This appt was 30 mins.  FU 2 mos  Lynder Parents, MD, DFAPA    Please see After Visit Summary for patient specific instructions.  No future appointments.  No orders of the defined types were placed in this encounter.    Lynder Parents, MD, DFAPA   -------------------------------

## 2018-11-13 ENCOUNTER — Telehealth: Payer: Self-pay | Admitting: Psychiatry

## 2018-11-13 NOTE — Telephone Encounter (Signed)
Prior authorization submitted for Belsomra 20 mg through [facility] for approval 11/13/2018-11/12/2019

## 2018-11-13 NOTE — Telephone Encounter (Signed)
Patient need a PA on Belsomra per insurance, only have 1 night of medication left.  [facility] [address]

## 2018-11-17 ENCOUNTER — Other Ambulatory Visit: Payer: Self-pay | Admitting: Psychiatry

## 2018-12-12 ENCOUNTER — Telehealth: Payer: Self-pay | Admitting: Psychiatry

## 2018-12-12 NOTE — Telephone Encounter (Signed)
Pt is requesting an early refill of Belsomra. Last filled 8/11. Can fill tomorrow but pt nees it today.  [facility] [address] [place]

## 2018-12-12 NOTE — Telephone Encounter (Signed)
Early refill approved, pharmacist aware

## 2018-12-12 NOTE — Telephone Encounter (Signed)
[name] called back just wanting to say she's not sure why see is out early.  Has searched everywhere.  May have thrown out a packet that she thought was finished.  But she can't sleep without this medication.

## 2018-12-18 ENCOUNTER — Other Ambulatory Visit: Payer: Self-pay

## 2018-12-18 ENCOUNTER — Telehealth: Payer: Self-pay | Admitting: Psychiatry

## 2018-12-18 MED ORDER — BUPROPION HCL ER (XL) 300 MG PO TB24: 300.0000 mg | ORAL_TABLET | Freq: Every day | ORAL | 0 refills | Status: DC

## 2018-12-18 NOTE — Telephone Encounter (Signed)
We switched her from Wellbutrin XL 300 mg every morning to Wellbutrin SR 200 mg every morning because she was having insomnia from the 300 mg tablet.  If she is willing to retry the Wellbutrin XL 300 mg in the morning and see if she tolerates it better now and that is okay with me.  She needs to be aware it might cause insomnia again.

## 2018-12-18 NOTE — Telephone Encounter (Signed)
Patient would like to go back on Wellbutrin XL 300 mg, the SR just is not working for her. Advised to take early in the morning to not effect her sleep. Instructed to call back with any issues. [facility]

## 2018-12-18 NOTE — Telephone Encounter (Signed)
Patient called and said that the wellbutirin sr 100mg  is not working as well as the wellbutritin xr was  She would like to go back on to that original medication. [facility] [address] Her next appt is 10/5

## 2018-12-21 ENCOUNTER — Other Ambulatory Visit: Payer: Self-pay | Admitting: Obstetrics and Gynecology

## 2018-12-21 DIAGNOSIS — Z1231 Encounter for screening mammogram for malignant neoplasm of breast: Secondary | ICD-10-CM

## 2018-12-24 ENCOUNTER — Other Ambulatory Visit: Payer: Self-pay | Admitting: Psychiatry

## 2018-12-24 DIAGNOSIS — F5105 Insomnia due to other mental disorder: Secondary | ICD-10-CM

## 2018-12-26 ENCOUNTER — Telehealth: Payer: Self-pay | Admitting: Psychiatry

## 2018-12-26 NOTE — Telephone Encounter (Signed)
Patient need refill on Alprazolam 1 mg, to be sent to [facility] [address], Barbara Cower not due to be filled until Friday but patient only has enough to last her until Thurs., and need to authorized by CC

## 2018-12-26 NOTE — Telephone Encounter (Signed)
appt 10/05

## 2018-12-26 NOTE — Telephone Encounter (Signed)
Refill submitted by provider earlier

## 2018-12-28 DIAGNOSIS — K635 Polyp of colon: Secondary | ICD-10-CM | POA: Insufficient documentation

## 2018-12-28 DIAGNOSIS — K5790 Diverticulosis of intestine, part unspecified, without perforation or abscess without bleeding: Secondary | ICD-10-CM | POA: Insufficient documentation

## 2019-01-08 ENCOUNTER — Ambulatory Visit: Payer: BC Managed Care – PPO | Admitting: Psychiatry

## 2019-01-11 ENCOUNTER — Other Ambulatory Visit: Payer: Self-pay

## 2019-01-11 DIAGNOSIS — Z20822 Contact with and (suspected) exposure to covid-19: Secondary | ICD-10-CM

## 2019-01-12 LAB — NOVEL CORONAVIRUS, NAA: SARS-CoV-2, NAA: NOT DETECTED

## 2019-01-15 ENCOUNTER — Other Ambulatory Visit: Payer: Self-pay

## 2019-01-15 ENCOUNTER — Ambulatory Visit (INDEPENDENT_AMBULATORY_CARE_PROVIDER_SITE_OTHER): Payer: BC Managed Care – PPO | Admitting: Psychiatry

## 2019-01-15 ENCOUNTER — Encounter: Payer: Self-pay | Admitting: Psychiatry

## 2019-01-15 DIAGNOSIS — R7989 Other specified abnormal findings of blood chemistry: Secondary | ICD-10-CM | POA: Diagnosis not present

## 2019-01-15 DIAGNOSIS — F5105 Insomnia due to other mental disorder: Secondary | ICD-10-CM

## 2019-01-15 DIAGNOSIS — F411 Generalized anxiety disorder: Secondary | ICD-10-CM | POA: Diagnosis not present

## 2019-01-15 DIAGNOSIS — F339 Major depressive disorder, recurrent, unspecified: Secondary | ICD-10-CM | POA: Diagnosis not present

## 2019-01-15 MED ORDER — LITHIUM CARBONATE ER 450 MG PO TBCR: 450.0000 mg | EXTENDED_RELEASE_TABLET | Freq: Every day | ORAL | 2 refills | Status: DC

## 2019-01-15 NOTE — Patient Instructions (Signed)
Increase lithium to 1 of the 450 mg tablet  Trial B 12 in the morning  Call if there's with problem to switch Wellbutrin XL 300mg , particularly insomnia

## 2019-01-15 NOTE — Progress Notes (Signed)
KINNA GREENIA DX:[id number] 1966-01-18 53 y.o.  Subjective:   Patient ID:  [name] is a 53 y.o. (DOB Jul 18, 1965) female.  Chief Complaint:  Chief Complaint  Patient presents with  . Depression    Follow-up treatment resistant depression and med management  . 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  At visit March 2020.  She had completed [place] early December.  "It worked"  and had resolution of her depression!  She started Wellbutrin XL 300 mg daily for relapse prevention.  She continued olanzapine 2.5 mg every afternoon because her anxiety relapsed when she would try to completely discontinue it.  Stopped lithium DT SE and felt worse and restarted 300 daily.   In may 2020 pt taking lithium 600 mg and CO balance problems.  She stopped lithium and lithium level at 62 hours later was 0.4, suggesting possible prior toxicity so the lithium was reduced to 300 mg.    in June.  Because Wellbutrin had affected her sleep we switched it to Wellbutrin SR 200 mg every morning and encouraged her to reduce caffeine.  She had felt well but Wellbutrin XL 300 mg made her edgy and caused some insomnia.  She was also encouraged to take NAC as a supplement daily.  She called back September 14 stating that she wanted to switch back to Wellbutrin XL 300 mg because she felt that SR 200 mg was not working sufficiently.  She was cautioned about it contributing to insomnia because there was a concern and had done that in the past.  Started a new job temporarily which didn't work.  Was so nervous going and needed 1/2 Xanax to go to work.  Couldn't meed the schedule demands Still has balance issues and memory. She reduced the lithium from 300 to 150 mg daily bc she doesn't like taking a lot of meds and husband doesn't either.  She made the change on her own.  Restarted [place] July August  2020 and felt much better.  Seeing [name] at [facility].  .  Mostly resolved with this tx and finished end of August.  Was good for a month and then the depression returned.  Kind of avoiding people, not severe.  But had periods of "horrific suicidal thoughts" though comments to safety.    SI in the last 3 weeks.  Things Pilar Plate says probably contributes to a lot of it.    Sister died 1 mo ago COPD and was close to her.    Exhausted.  At times has to lay down.  Hard to get up and go work out in the morning.  Lower nefazadone 200 mg did not help her energy and her sleep was worse.    Sleep better with short-acting Wellbutrin. Needs 10-12 hours and getting about 9 hours.  But disrupted sleep.  Goes back to sleep.  Usually wakes 2-3 times.  But still tired.  During [place] no depression and no SI but still tired.    Depression has recurred to a moderate level anxiety is still moderate. Stays busy and active.  Exercising still.  Lost weight with Wellbutrin.  Jobless not good.  Makes her nervous.  But kids around.  Function is ok.   Doesn't eat much protein.  Eats peanut butter, no eggs, no meat.    Sleeping with Belsomra and Xanax. 9-10 hours  CO dry mouth and cavities.  Metal taste in her mouth.  Easily confused.  Might drive to the wrong place.  CO poor memory.  Forgot about which weddings she was in.  Has to write everything down. Anxiety about accidents.  Still has SI without intent or plan.  No libido is terrible.  CO cold and had thyroid tests at [facility] office without known results.  Long history of severe treatment resistant depression having failed multiple medications including imipramine Latuda paroxetine lithium, nefazodone, olanzapine, quetiapine, doxepin, Lunesta, gabapentin, Viibryd, Zofran, Vyvanse, Rexulti caused NMS, pramipexole, Abilify, ropinirole, ProSom, Belsomra, Trintellix, Vyvanse, Prozac, Provigil, duloxetine 90 mg, temazepam, Xanax, venlafaxine, Emsam 9 mg, Wellbutrin,  Pristiq, lamotrigine ? response, mirtazapine Deplin, TMS, and she did respond to ECT., nefazodone 600 sedation, lithium 600, NAC  Review of Systems:  Review of Systems  Constitutional: Positive for fatigue.  Gastrointestinal: Negative for abdominal pain.  Neurological: Negative for tremors and weakness.  Psychiatric/Behavioral: Positive for dysphoric mood. Negative for agitation, behavioral problems, confusion, decreased concentration, hallucinations, self-injury, sleep disturbance and suicidal ideas. The patient is nervous/anxious. The patient is not hyperactive.     Medications: I have reviewed the patient's current medications.  Current Outpatient Medications  Medication Sig Dispense Refill  . ALPRAZolam (XANAX) 1 MG tablet TAKE 1 TABLET BY MOUTH 3 TIMES A DAY AS NEEDED FOR ANXIETY 30 tablet 2  . buPROPion (WELLBUTRIN XL) 300 MG 24 hr tablet Take 1 tablet (300 mg total) by mouth daily. 90 tablet 0  . cholecalciferol (VITAMIN D3) 25 MCG (1000 UT) tablet Take 2,000 Units by mouth daily.     . nefazodone (SERZONE) 200 MG tablet TAKE 1 AND 1/2 TABLETS BY MOUTH EVERY DAY AT BEDTIME. 135 tablet 2  . OLANZapine (ZYPREXA) 5 MG tablet TAKE 1 TABLET BY MOUTH EVERYDAY AT BEDTIME (Patient taking differently: Take 2.5 mg by mouth at bedtime. TAKE 1 TABLET BY MOUTH EVERYDAY AT BEDTIME) 90 tablet 1  . Suvorexant (BELSOMRA) 20 MG TABS Take 1 tablet by mouth at bedtime. 30 tablet 5  . lithium carbonate (ESKALITH) 450 MG CR tablet Take 1 tablet (450 mg total) by mouth at bedtime. 30 tablet 2   No current facility-administered medications for this visit.     Medication Side Effects: None  Allergies: No Known Allergies  Past Medical History:  Diagnosis Date  . Abdominal pain   . Anal itching   . Breast inflammation   . Breast nodule    left  . Chronic kidney disease   . Cystocele   . Decreased libido   . Depression   . Fibroadenoma   . Galactorrhea   . Mastodynia   . Pelvic pain in female    . Pelvic relaxation   . Rectocele   . Serotonin syndrome     Family History  Problem Relation Age of Onset  . Heart disease Mother        angioplasty  . Cancer Father        prostate  . Hypertension Father   . Depression Father   . Bipolar disorder Father   . Rheum arthritis Sister   . Rheum arthritis Brother   . Hypertension Maternal Grandmother   . Diabetes Maternal Grandmother        borderline  . Hypertension Paternal Grandmother   . Arthritis Paternal Grandmother   . Stroke Paternal Grandmother   . Rheum arthritis Paternal Grandfather   . Breast cancer Neg Hx    FHx: sister on olanzapine 5mg  and fluoxetine.  Social History  Socioeconomic History  . Marital status: Married    Spouse name: Not on file  . Number of children: 3  . Years of education: College  . Highest education level: Not on file  Occupational History  . Occupation: Surveyor, mining: Futures trader at [employer]  . Financial resource strain: Not on file  . Food insecurity    Worry: Not on file    Inability: Not on file  . Transportation needs    Medical: Not on file    Non-medical: Not on file  Tobacco Use  . Smoking status: Current Every Day Smoker    Packs/day: 0.50    Types: Cigarettes  . Smokeless tobacco: Never Used  Substance and Sexual Activity  . Alcohol use: Yes    Alcohol/week: 1.0 standard drinks    Types: 1 Glasses of wine per week    Comment: one drink about once a month  . Drug use: No  . Sexual activity: Yes    Birth control/protection: Other-see comments    Comment: pt had hyst  Lifestyle  . Physical activity    Days per week: Not on file    Minutes per session: Not on file  . Stress: Not on file  Relationships  . Social Herbalist on phone: Not on file    Gets together: Not on file    Attends religious service: Not on file    Active member of club or organization: Not on file    Attends meetings of clubs or organizations: Not  on file    Relationship status: Not on file  . Intimate partner violence    Fear of current or ex partner: Not on file    Emotionally abused: Not on file    Physically abused: Not on file    Forced sexual activity: Not on file  Other Topics Concern  . Not on file  Social History Narrative  . Not on file    Past Medical History, Surgical history, Social history, and Family history were reviewed and updated as appropriate.   Please see review of systems for further details on the patient's review from today.   Objective:   Physical Exam:  LMP 05/20/2010   Physical Exam Constitutional:      General: She is not in acute distress.    Appearance: She is well-developed.  Musculoskeletal:        General: No deformity.  Neurological:     Mental Status: She is alert and oriented to person, place, and time.     Motor: No tremor.     Coordination: Coordination normal.     Gait: Gait normal.  Psychiatric:        Attention and Perception: Attention and perception normal. She is attentive.        Mood and Affect: Mood is anxious. Mood is not depressed. Affect is not labile, blunt, angry or inappropriate.        Speech: Speech normal.        Behavior: Behavior normal.        Thought Content: Thought content normal. Thought content does not include homicidal or suicidal ideation. Thought content does not include homicidal or suicidal plan.        Cognition and Memory: Cognition normal.        Judgment: Judgment normal.     Comments: Insight intact and judgment are good. No auditory or visual hallucinations.  Marland Kitchen  Lab Review:     Component Value Date/Time   NA 140 10/10/2015 1115   K 3.6 10/10/2015 1115   CL 102 10/10/2015 1115   CO2 31 10/10/2015 1115   GLUCOSE 102 (H) 10/10/2015 1115   BUN 11 10/10/2015 1115   CREATININE 0.84 10/10/2015 1115   CALCIUM 9.4 10/10/2015 1115   PROT 6.6 10/10/2015 1115   ALBUMIN 4.5 10/10/2015 1115   AST 26 10/10/2015 1115   ALT 21  10/10/2015 1115   ALKPHOS 60 10/10/2015 1115   BILITOT 0.9 10/10/2015 1115   GFRNONAA >60 10/10/2015 1115   GFRAA >60 10/10/2015 1115       Component Value Date/Time   WBC 7.1 11/16/2017 0950   RBC 4.51 11/16/2017 0950   HGB 15.0 11/16/2017 0950   HCT 44.8 11/16/2017 0950   PLT 209.0 11/16/2017 0950   MCV 99.3 11/16/2017 0950   MCH 33.5 10/10/2015 1115   MCHC 33.5 11/16/2017 0950   RDW 13.0 11/16/2017 0950   LYMPHSABS 1.9 11/16/2017 0950   MONOABS 0.5 11/16/2017 0950   EOSABS 0.2 11/16/2017 0950   BASOSABS 0.0 11/16/2017 0950    Lithium Lvl  Date Value Ref Range Status  08/17/2018 0.4 (L) 0.6 - 1.2 mmol/L Final    Comment:                                     Detection Limit = 0.1                           <0.1 indicates None Detected      No results found for: PHENYTOIN, PHENOBARB, VALPROATE, CBMZ   Lithium level received April 27, 2018 was 0.7.  This is probably adequate to help reduce the risk of relapse of depression post [place].  Therefore no med changes.    Vitamin D level April 27, 2018 on supplement was 60 and adequate. .res Assessment: Plan:    Albertia was seen today for depression and sleeping problem.  Diagnoses and all orders for this visit:  Recurrent major depression resistant to treatment ([place])  Generalized anxiety disorder  Insomnia due to mental condition  Low vitamin D level  Other orders -     lithium carbonate (ESKALITH) 450 MG CR tablet; Take 1 tablet (450 mg total) by mouth at bedtime.    Greater than 50% of face to face time with patient was spent on counseling and coordination of care. 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  Reviewed multiple failed medications noted above.  She failed the attempt to wean Zyprexa because of worsening anxiety.  Anxiety is managed at 2.5 mg Zyprexa daily.  Option increase nefazodone again.  Some compliance issues. She doesn't like taking so much meds. She has a choice to continue Wellbutrin SR 200 mg every  morning or switch to XL 300 mg every morning which she indicated before she wanted to do.  We discussed the risk of this triggering insomnia.  She may choose to do so to see if it would help with the depression.  Defer per her request NAC 600 mg or more daily.  Won't check her weight but thinks she's losing.  Thinks it's OK.  ? Protein intake.  Emphasized again.  Trial B12 to see if energy is better in the morning.  Increase lithium to 450 mg for SI and depression.  Discussed potential metabolic side  effects associated with atypical antipsychotics, as well as potential risk for movement side effects. Advised pt to contact office if movement side effects occur.   Counseled patient regarding potential benefits, risks, and side effects of lithium to include potential risk of lithium affecting thyroid and renal function.  Discussed need for periodic lab monitoring to determine drug level and to assess for potential adverse effects.  Counseled patient regarding signs and symptoms of lithium toxicity and advised that they notify office immediately or seek urgent medical attention if experiencing these signs and symptoms.  Patient advised to contact office with any questions or concerns.  Disc lithium and toxicity.  Option maintenance TMS bc it did help.  Says insurance won't cover except course every 6 mos.  This appt was 30 mins.  FU 2 mos  Lynder Parents, MD, DFAPA    Please see After Visit Summary for patient specific instructions.  Future Appointments  Date Time Provider [place]  02/06/2019 11:30 AM Cottle, Billey Co., MD [facility] None  02/07/2019  9:40 AM [facility] MM 2 [facility]  03/27/2019  1:00 PM Cottle, Billey Co., MD CP[facility] None    No orders of the defined types were placed in this encounter.    Lynder Parents, MD, DFAPA   -------------------------------

## 2019-01-20 ENCOUNTER — Other Ambulatory Visit: Payer: Self-pay | Admitting: Psychiatry

## 2019-02-06 ENCOUNTER — Ambulatory Visit: Payer: Self-pay | Admitting: Psychiatry

## 2019-02-07 ENCOUNTER — Other Ambulatory Visit: Payer: Self-pay

## 2019-02-07 ENCOUNTER — Ambulatory Visit
Admission: RE | Admit: 2019-02-07 | Discharge: 2019-02-07 | Disposition: A | Discharge status: Home / Self Care | Payer: BC Managed Care – PPO | Source: Ambulatory Visit | Attending: Obstetrics and Gynecology | Admitting: Obstetrics and Gynecology

## 2019-02-07 DIAGNOSIS — Z1231 Encounter for screening mammogram for malignant neoplasm of breast: Secondary | ICD-10-CM

## 2019-02-13 ENCOUNTER — Other Ambulatory Visit: Payer: Self-pay | Admitting: Psychiatry

## 2019-02-13 DIAGNOSIS — F411 Generalized anxiety disorder: Secondary | ICD-10-CM

## 2019-02-13 DIAGNOSIS — F339 Major depressive disorder, recurrent, unspecified: Secondary | ICD-10-CM

## 2019-02-27 ENCOUNTER — Telehealth: Payer: Self-pay | Admitting: Psychiatry

## 2019-02-27 NOTE — Telephone Encounter (Signed)
Left voicemail to call back with current dose of her Wellbutrin and her symptoms

## 2019-02-27 NOTE — Telephone Encounter (Signed)
Patient called and wants to know if she can take two wellbutrin a day. Wants to know your thoughts? Please give her a call at [phone]

## 2019-02-27 NOTE — Telephone Encounter (Signed)
Left detailed information and to call back with a pharmacy and discuss her dose

## 2019-02-27 NOTE — Telephone Encounter (Signed)
She is currently on Wellbutrin XL 300 mg daily.  The maximum allowable dose is 450 mg daily and she cannot cut those tablets.  There is a risk of insomnia with increasing the dose but if she is willing to accept that risk we can increase the dose to Wellbutrin XL 150 mg tablets 3 daily or she can add 150 mg tablet to her current 300 mg tablet.

## 2019-03-09 ENCOUNTER — Other Ambulatory Visit: Payer: Self-pay

## 2019-03-09 MED ORDER — BUPROPION HCL ER (XL) 150 MG PO TB24: 450.0000 mg | ORAL_TABLET | Freq: Every day | ORAL | 0 refills | Status: DC

## 2019-03-09 NOTE — Telephone Encounter (Signed)
[facility]    [address] AT [place]

## 2019-03-09 NOTE — Telephone Encounter (Signed)
Noted and updated Rx submitted Wellbutrin XL 150 mg take 3 tablets every morning.

## 2019-03-19 ENCOUNTER — Other Ambulatory Visit: Payer: Self-pay | Admitting: Psychiatry

## 2019-03-19 DIAGNOSIS — F5105 Insomnia due to other mental disorder: Secondary | ICD-10-CM

## 2019-03-27 ENCOUNTER — Ambulatory Visit (INDEPENDENT_AMBULATORY_CARE_PROVIDER_SITE_OTHER): Payer: BC Managed Care – PPO | Admitting: Psychiatry

## 2019-03-27 ENCOUNTER — Encounter: Payer: Self-pay | Admitting: Psychiatry

## 2019-03-27 DIAGNOSIS — F339 Major depressive disorder, recurrent, unspecified: Secondary | ICD-10-CM | POA: Diagnosis not present

## 2019-03-27 DIAGNOSIS — F411 Generalized anxiety disorder: Secondary | ICD-10-CM

## 2019-03-27 DIAGNOSIS — F5105 Insomnia due to other mental disorder: Secondary | ICD-10-CM

## 2019-03-27 DIAGNOSIS — R7989 Other specified abnormal findings of blood chemistry: Secondary | ICD-10-CM

## 2019-03-27 MED ORDER — BELSOMRA 20 MG PO TABS: 1.0000 | ORAL_TABLET | Freq: Every day | ORAL | 5 refills | Status: DC

## 2019-03-27 MED ORDER — BUPROPION HCL ER (XL) 150 MG PO TB24: 450.0000 mg | ORAL_TABLET | Freq: Every day | ORAL | 0 refills | Status: DC

## 2019-03-27 MED ORDER — ALPRAZOLAM 1 MG PO TABS: ORAL_TABLET | ORAL | 1 refills | Status: DC

## 2019-03-27 NOTE — Progress Notes (Signed)
[name] [id number] [birth year] 53 y.o.   Virtual Visit via Telephone Note  I connected with pt by telephone and verified that I am speaking with the correct person using two identifiers.   I discussed the limitations, risks, security and privacy concerns of performing an evaluation and management service by telephone and the availability of in person appointments. I also discussed with the patient that there may be a patient responsible charge related to this service. The patient expressed understanding and agreed to proceed.  I discussed the assessment and treatment plan with the patient. The patient was provided an opportunity to ask questions and all were answered. The patient agreed with the plan and demonstrated an understanding of the instructions.   The patient was advised to call back or seek an in-person evaluation if the symptoms worsen or if the condition fails to improve as anticipated.  I provided 25 minutes of non-face-to-face time during this encounter. The call started at 100 and ended at 125 he patient was located at home and the provider was located home.   Subjective:   Patient ID:  [name] is a 53 y.o. (DOB [birth year]) female.  Chief Complaint:  Chief Complaint  Patient presents with  . Follow-up    Medication Management  . Depression    Medication Management  . Medication Refill    Belsomra and Xanax.    Depression        Associated symptoms include fatigue.  Associated symptoms include no decreased concentration and no suicidal ideas. Medication Refill Associated symptoms include fatigue. Pertinent negatives include no abdominal pain or weakness.    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  At visit March 2020.  She had completed [place] early December.  "It worked"  and had resolution of her depression!  She started Wellbutrin XL 300 mg daily for  relapse prevention.  She continued olanzapine 2.5 mg every afternoon because her anxiety relapsed when she would try to completely discontinue it.  Stopped lithium DT SE and felt worse and restarted 300 daily.   In may 2020 pt taking lithium 600 mg and CO balance problems.  She stopped lithium and lithium level at 62 hours later was 0.4, suggesting possible prior toxicity so the lithium was reduced to 300 mg.    in June.  Because Wellbutrin had affected her sleep we switched it to Wellbutrin SR 200 mg every morning and encouraged her to reduce caffeine.  She had felt well but Wellbutrin XL 300 mg made her edgy and caused some insomnia.  She was also encouraged to take NAC as a supplement daily.  She called back September 14 stating that she wanted to switch back to Wellbutrin XL 300 mg because she felt that SR 200 mg was not working sufficiently.  She was cautioned about it contributing to insomnia because there was a concern and had done that in the past.  Started a new job temporarily which didn't work.  Was so nervous going and needed 1/2 Xanax to go to work.  Couldn't meed the schedule demands Still has balance issues and memory. She reduced the lithium from 300 to 150 mg daily bc she doesn't like taking a lot of meds and husband doesn't either.  She made the change on her own.  Restarted [place] July August 2020 and felt much better.  Seeing [name] at [place].  .  Mostly resolved with this tx  and finished end of August.  Was good for a month and then the depression returned.  Kind of avoiding people, not severe.  But had periods of "horrific suicidal thoughts" though comments to safety.   Siister died 2023-01-02 COPD and was close to her.    Last visit Jan 15, 2019 and increased lithium to 450, suggested B12 for energy.  Asks about incr Wellbutrin above 300 bc not edgy or anxious or insomnia from it now.  Tolerated it.  SI resolved overnight with increased lithium.  Still having problems with  balance.  Some word-finding issues and anxiety.  Energy is better.  Sleep is good at this time. Some benefit with incr Wellbutrin.  Exhausted.  At times has to lay down.  Hard to get up and go work out in the morning.  Lower nefazadone 200 mg did not help her energy and her sleep was worse.     But still tired.  During [place] no depression and no SI but still tired.    Depression has recurred to a moderate level anxiety is still moderate. Stays busy and active.  Exercising still.  Lost weight with Wellbutrin.  Jobless not good.  Makes her nervous.  But kids around.  Function is ok.   Doesn't eat much protein.  Eats peanut butter, no eggs, no meat.   Sleeping with Belsomra and Xanax. 9-10 hours  CO dry mouth and cavities.  Metal taste in her mouth.  Easily confused.     Long history of severe treatment resistant depression having failed multiple medications including imipramine Latuda paroxetine lithium, nefazodone, olanzapine, quetiapine, doxepin, Lunesta, gabapentin, Viibryd, Zofran, Vyvanse, Rexulti caused NMS, pramipexole, Abilify, ropinirole, ProSom, Belsomra, Trintellix, Vyvanse, Prozac, Provigil, duloxetine 90 mg, temazepam, Xanax, venlafaxine, Emsam 9 mg, Wellbutrin, Pristiq, lamotrigine ? response, mirtazapine Deplin, TMS, and she did respond to ECT., nefazodone 600 sedation, lithium 600, NAC  Review of Systems:  Review of Systems  Constitutional: Positive for fatigue.  HENT: Positive for dental problem.   Gastrointestinal: Negative for abdominal pain.  Neurological: Negative for tremors and weakness.  Psychiatric/Behavioral: Positive for depression and dysphoric mood. Negative for agitation, behavioral problems, confusion, decreased concentration, hallucinations, self-injury, sleep disturbance and suicidal ideas. The patient is nervous/anxious. The patient is not hyperactive.     Medications: I have reviewed the patient's current medications.  Current Outpatient Medications   Medication Sig Dispense Refill  . cholecalciferol (VITAMIN D3) 25 MCG (1000 UT) tablet Take 2,000 Units by mouth daily.     . Cyanocobalamin (VITAMIN B 12 PO) Take by mouth.    . lithium carbonate (ESKALITH) 450 MG CR tablet Take 1 tablet (450 mg total) by mouth at bedtime. 30 tablet 2  . nefazodone (SERZONE) 200 MG tablet TAKE 1 & 1/2 TABLETS BY MOUTH EVERY DAY AT BEDTIME. (Patient taking differently: 400 mg. ) 135 tablet 3  . OLANZapine (ZYPREXA) 5 MG tablet TAKE 1 TABLET BY MOUTH EVERYDAY AT BEDTIME (Patient taking differently: Take 2.5 mg by mouth at bedtime. ) 90 tablet 2  . ALPRAZolam (XANAX) 1 MG tablet TAKE 1 TABLET BY MOUTH THREE TIMES A DAY AS NEEDED FOR ANXIETY 35 tablet 1  . buPROPion (WELLBUTRIN XL) 150 MG 24 hr tablet Take 3 tablets (450 mg total) by mouth daily. 270 tablet 0  . Suvorexant (BELSOMRA) 20 MG TABS Take 1 tablet by mouth at bedtime. 30 tablet 5   No current facility-administered medications for this visit.    Medication Side Effects: None  Allergies: No  Known Allergies  Past Medical History:  Diagnosis Date  . Abdominal pain   . Anal itching   . Breast inflammation   . Breast nodule    left  . Chronic kidney disease   . Cystocele   . Decreased libido   . Depression   . Fibroadenoma   . Galactorrhea   . Mastodynia   . Pelvic pain in female   . Pelvic relaxation   . Rectocele   . Serotonin syndrome     Family History  Problem Relation Age of Onset  . Heart disease Mother        angioplasty  . Cancer Father        prostate  . Hypertension Father   . Depression Father   . Bipolar disorder Father   . Rheum arthritis Sister   . Rheum arthritis Brother   . Hypertension Maternal Grandmother   . Diabetes Maternal Grandmother        borderline  . Hypertension Paternal Grandmother   . Arthritis Paternal Grandmother   . Stroke Paternal Grandmother   . Rheum arthritis Paternal Grandfather   . Breast cancer Neg Hx    FHx: sister on olanzapine  5mg  and fluoxetine.  Social History   Socioeconomic History  . Marital status: Married    Spouse name: Not on file  . Number of children: 3  . Years of education: College  . Highest education level: Not on file  Occupational History  . Occupation: Surveyor, mining: Futures trader at [employer]  Tobacco Use  . Smoking status: Current Every Day Smoker    Packs/day: 0.50    Types: Cigarettes  . Smokeless tobacco: Never Used  Substance and Sexual Activity  . Alcohol use: Yes    Alcohol/week: 1.0 standard drinks    Types: 1 Glasses of wine per week    Comment: one drink about once a month  . Drug use: No  . Sexual activity: Yes    Birth control/protection: Other-see comments    Comment: pt had hyst  Other Topics Concern  . Not on file  Social History Narrative  . Not on file   Social Determinants of Health   Financial Resource Strain:   . Difficulty of Paying Living Expenses: Not on file  Food Insecurity:   . Worried About Charity fundraiser in the Last Year: Not on file  . Ran Out of Food in the Last Year: Not on file  Transportation Needs:   . Lack of Transportation (Medical): Not on file  . Lack of Transportation (Non-Medical): Not on file  Physical Activity:   . Days of Exercise per Week: Not on file  . Minutes of Exercise per Session: Not on file  Stress:   . Feeling of Stress : Not on file  Social Connections:   . Frequency of Communication with Friends and Family: Not on file  . Frequency of Social Gatherings with Friends and Family: Not on file  . Attends Religious Services: Not on file  . Active Member of Clubs or Organizations: Not on file  . Attends Archivist Meetings: Not on file  . Marital Status: Not on file  Intimate Partner Violence:   . Fear of Current or Ex-Partner: Not on file  . Emotionally Abused: Not on file  . Physically Abused: Not on file  . Sexually Abused: Not on file    Past Medical History, Surgical history,  Social history, and Family history were reviewed  and updated as appropriate.   Please see review of systems for further details on the patient's review from today.   Objective:   Physical Exam:  LMP 05/20/2010   Physical Exam Neurological:     Mental Status: She is alert and oriented to person, place, and time.     Cranial Nerves: No dysarthria.  Psychiatric:        Attention and Perception: Attention and perception normal.        Mood and Affect: Mood is anxious and depressed.        Speech: Speech normal.        Behavior: Behavior is cooperative.        Thought Content: Thought content normal. Thought content is not paranoid or delusional. Thought content does not include homicidal or suicidal ideation. Thought content does not include homicidal or suicidal plan.        Cognition and Memory: Cognition and memory normal.        Judgment: Judgment normal.     Comments: Insight intact SI resolved. Dep better with higher Wellbutrin and lithium     Lab Review:     Component Value Date/Time   NA 140 10/10/2015 1115   K 3.6 10/10/2015 1115   CL 102 10/10/2015 1115   CO2 31 10/10/2015 1115   GLUCOSE 102 (H) 10/10/2015 1115   BUN 11 10/10/2015 1115   CREATININE 0.84 10/10/2015 1115   CALCIUM 9.4 10/10/2015 1115   PROT 6.6 10/10/2015 1115   ALBUMIN 4.5 10/10/2015 1115   AST 26 10/10/2015 1115   ALT 21 10/10/2015 1115   ALKPHOS 60 10/10/2015 1115   BILITOT 0.9 10/10/2015 1115   GFRNONAA >60 10/10/2015 1115   GFRAA >60 10/10/2015 1115       Component Value Date/Time   WBC 7.1 11/16/2017 0950   RBC 4.51 11/16/2017 0950   HGB 15.0 11/16/2017 0950   HCT 44.8 11/16/2017 0950   PLT 209.0 11/16/2017 0950   MCV 99.3 11/16/2017 0950   MCH 33.5 10/10/2015 1115   MCHC 33.5 11/16/2017 0950   RDW 13.0 11/16/2017 0950   LYMPHSABS 1.9 11/16/2017 0950   MONOABS 0.5 11/16/2017 0950   EOSABS 0.2 11/16/2017 0950   BASOSABS 0.0 11/16/2017 0950    Lithium Lvl  Date Value Ref Range  Status  08/17/2018 0.4 (L) 0.6 - 1.2 mmol/L Final    Comment:                                     Detection Limit = 0.1                           <0.1 indicates None Detected      No results found for: PHENYTOIN, PHENOBARB, VALPROATE, CBMZ   Lithium level received April 27, 2018 was 0.7.  This is probably adequate to help reduce the risk of relapse of depression post [place].  Therefore no med changes.    Vitamin D level April 27, 2018 on supplement was 60 and adequate. .res Assessment: Plan:    Ranijah was seen today for follow-up, depression and medication refill.  Diagnoses and all orders for this visit:  Recurrent major depression resistant to treatment ([place]) -     buPROPion (WELLBUTRIN XL) 150 MG 24 hr tablet; Take 3 tablets (450 mg total) by mouth daily.  Insomnia due to mental condition -  ALPRAZolam (XANAX) 1 MG tablet; TAKE 1 TABLET BY MOUTH THREE TIMES A DAY AS NEEDED FOR ANXIETY -     Suvorexant (BELSOMRA) 20 MG TABS; Take 1 tablet by mouth at bedtime.  Generalized anxiety disorder  Low vitamin D level    Greater than 50% of 25 min non face to face time with patient was spent on counseling and coordination of care. 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  Reviewed multiple failed medications noted above.  She failed the attempt to wean Zyprexa because of worsening anxiety.  Anxiety is helped with 2.5 mg Zyprexa daily.  Option increase nefazodone again.  Some compliance issues. She doesn't like taking so much meds. After Xmas trial incr Wellbutrin XL 450 mg AM if tolerated.  Disc SE including more anxiety, insomnia.  Defer per her request NAC 600 mg or more daily.  Won't check her weight but thinks she's losing.  Thinks it's OK.  ? Protein intake.  Emphasized again.  Trial B12 to see if energy is better in the morning.  Her SI is gone after increasing lithium to 450 mg for SI and depression.  Continue this  Discussed potential metabolic side effects associated with  atypical antipsychotics, as well as potential risk for movement side effects. Advised pt to contact office if movement side effects occur.   Counseled patient regarding potential benefits, risks, and side effects of lithium to include potential risk of lithium affecting thyroid and renal function.  Discussed need for periodic lab monitoring to determine drug level and to assess for potential adverse effects.  Counseled patient regarding signs and symptoms of lithium toxicity and advised that they notify office immediately or seek urgent medical attention if experiencing these signs and symptoms.  Patient advised to contact office with any questions or concerns.  Disc lithium and toxicity.  Option maintenance TMS bc it did help.  Says insurance won't cover except course every 6 mos.  FU 6 weeks  Lynder Parents, MD, DFAPA    Please see After Visit Summary for patient specific instructions.  No future appointments.  No orders of the defined types were placed in this encounter.    Lynder Parents, MD, DFAPA   -------------------------------

## 2019-04-05 ENCOUNTER — Telehealth: Payer: Self-pay | Admitting: Psychiatry

## 2019-04-05 NOTE — Telephone Encounter (Signed)
[facility] will [facility] over[facility] probably, for even a few since insurance won't cover early refill.  I suggest she come by to get samples or if she can't then I'll send into some place like [facility] or [facility]

## 2019-04-05 NOTE — Telephone Encounter (Signed)
Pt. Is coming to get samples.

## 2019-04-05 NOTE — Telephone Encounter (Signed)
OK check chart.  I thinks she is on 20 mg Belsomra.  Give her 2-3 packets samples

## 2019-04-05 NOTE — Telephone Encounter (Signed)
You are correct it is 20 mg. I will give her 2 of the 10 mg samples to get her through to Tuesday.

## 2019-04-05 NOTE — Telephone Encounter (Signed)
Pt has misplaced a day of Belsomra and she will be out on Jan 1st and can't get a refill until Jan 3rd. Pt wants to know if she can get a few [facility] at [facility] [address].

## 2019-04-10 ENCOUNTER — Other Ambulatory Visit: Payer: Self-pay | Admitting: Psychiatry

## 2019-04-10 ENCOUNTER — Telehealth: Payer: Self-pay | Admitting: Psychiatry

## 2019-04-10 DIAGNOSIS — F339 Major depressive disorder, recurrent, unspecified: Secondary | ICD-10-CM

## 2019-04-10 NOTE — Telephone Encounter (Signed)
Pt. Made aware.

## 2019-04-10 NOTE — Telephone Encounter (Signed)
Pt is requesting a lab order for lithium level. Can we send one in to [place]?

## 2019-04-10 NOTE — Telephone Encounter (Signed)
Order sent for lithium level.  Please inform patient was sent to [facility].

## 2019-04-12 LAB — LITHIUM LEVEL: Lithium Lvl: 0.7 mmol/L (ref 0.6–1.2)

## 2019-04-12 NOTE — Progress Notes (Signed)
Lithium level was in the low normal range at the same amount that it was 11 months ago we checked it but a little more than it was 7 months ago when we checked it.  My notes indicate that she had reduced the lithium prior to her visit with me and December.   What dosage of lithium was she taking at the time she got this blood test drawn?  Does she have concerns about side effects?   Does she have concerns about her mood in relation to the lithium dosage?

## 2019-04-13 ENCOUNTER — Telehealth: Payer: Self-pay | Admitting: Psychiatry

## 2019-04-13 NOTE — Progress Notes (Signed)
At the time labs were drawn she was taking 450 Mg daily. For side effects she says she has a lot of balance issues, memory is her most concerning symptom. Can this cause memory issues? She does not have any concerns about the dosage.

## 2019-04-13 NOTE — Telephone Encounter (Signed)
I didn't reach her. Left a VM for her to return my call.

## 2019-04-13 NOTE — Telephone Encounter (Signed)
[name] called wanting to review her lab results.  Please call to discuss.

## 2019-04-13 NOTE — Progress Notes (Signed)
Left her a VM to return my call.

## 2019-04-16 ENCOUNTER — Other Ambulatory Visit: Payer: Self-pay | Admitting: Psychiatry

## 2019-04-16 NOTE — Progress Notes (Signed)
Pt. Made aware.

## 2019-04-16 NOTE — Progress Notes (Signed)
Generally lithium does not interfere with memory.  I do not want to make another medicine change until her appointment.  We had made some recent discussions about med changes around Wellbutrin and we need to have an appointment to discuss the big picture before we make any decisions about changing dosages.  The lithium level was okay it was not elevated.

## 2019-05-08 ENCOUNTER — Encounter: Payer: Self-pay | Admitting: Psychiatry

## 2019-05-08 ENCOUNTER — Ambulatory Visit (INDEPENDENT_AMBULATORY_CARE_PROVIDER_SITE_OTHER): Payer: BC Managed Care – PPO | Admitting: Psychiatry

## 2019-05-08 ENCOUNTER — Other Ambulatory Visit: Payer: Self-pay

## 2019-05-08 DIAGNOSIS — F5105 Insomnia due to other mental disorder: Secondary | ICD-10-CM | POA: Diagnosis not present

## 2019-05-08 DIAGNOSIS — F411 Generalized anxiety disorder: Secondary | ICD-10-CM | POA: Diagnosis not present

## 2019-05-08 DIAGNOSIS — R7989 Other specified abnormal findings of blood chemistry: Secondary | ICD-10-CM | POA: Diagnosis not present

## 2019-05-08 DIAGNOSIS — F339 Major depressive disorder, recurrent, unspecified: Secondary | ICD-10-CM

## 2019-05-08 NOTE — Progress Notes (Signed)
KESLEIGH SVATEK DX:[id number] 10-23-1965 54 y.o.     Subjective:   Patient ID:  [name] is a 54 y.o. (DOB September 06, 1965) female.  Chief Complaint:  Chief Complaint  Patient presents with  . Depression  . Sleeping Problem  . Fatigue    Depression        Associated symptoms include fatigue.  Associated symptoms include no decreased concentration and no suicidal ideas. Medication Refill Associated symptoms include fatigue. Pertinent negatives include no abdominal pain or weakness.    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  At visit March 2020.  She had completed [place] early December.  "It worked"  and had resolution of her depression!  She started Wellbutrin XL 300 mg daily for relapse prevention.  She continued olanzapine 2.5 mg every afternoon because her anxiety relapsed when she would try to completely discontinue it.  Stopped lithium DT SE and felt worse and restarted 300 daily.   In may 2020 pt taking lithium 600 mg and CO balance problems.  She stopped lithium and lithium level at 62 hours later was 0.4, suggesting possible prior toxicity so the lithium was reduced to 300 mg.    in June.  Because Wellbutrin had affected her sleep we switched it to Wellbutrin SR 200 mg every morning and encouraged her to reduce caffeine.  She had felt well but Wellbutrin XL 300 mg made her edgy and caused some insomnia.  She was also encouraged to take NAC as a supplement daily.  She called back September 14 stating that she wanted to switch back to Wellbutrin XL 300 mg because she felt that SR 200 mg was not working sufficiently.  She was cautioned about it contributing to insomnia because there was a concern and had done that in the past.  Started a new job temporarily which didn't work.  Was so nervous going and needed 1/2 Xanax to go to work.  Couldn't meed the schedule demands Still has  balance issues and memory. She reduced the lithium from 300 to 150 mg daily bc she doesn't like taking a lot of meds and husband doesn't either.  She made the change on her own.  Restarted [place] July August 2020 and felt much better.  Seeing [name] at [facility] [place].  .  Mostly resolved with this tx and finished end of August.  Was good for a month and then the depression returned.  Kind of avoiding people, not severe.  But had periods of "horrific suicidal thoughts" though comments to safety.   Siister died 01-01-2023 COPD and was close to her.    visit Jan 15, 2019 and increased lithium to 450, suggested B12 for energy.  Last seen March 27, 2019.  The following was noted: Anxiety is helped with 2.5 mg Zyprexa daily. Option increase nefazodone again.  Some compliance issues. She doesn't like taking so much meds. incr Wellbutrin XL 450 mg AM if tolerated.  Disc SE including more anxiety, insomnia. Trial B12 to see if energy is better in the morning. Her SI is gone after increasing lithium to 450 mg for SI and depression.  Continue this She continues sleep meds including Belsomra 20 mg nightly Requested lithium level  Increased Wellbutrin 450 too much jitteriness.  So reduced to 300 mg. Terrible STM. Loses track of thoughts in the middle of sentences.  Poor penmanship comes and goes. Sometimes when walks right foot sticks to the ground  with one fall.   So tired.    SI resolved overnight with increased lithium.  Still having problems with balance.  Some word-finding issues and anxiety.  Energy is better.  Sleep is good at this time. Some benefit with incr Wellbutrin.  Exhausted.  At times has to lay down.  Hard to get up and go work out in the morning.  Lower nefazadone 200 mg did not help her energy and her sleep was worse.     But still tired.  During [place] no depression and no SI but still tired.    Depression has recurred to a moderate level anxiety is still moderate. Stays busy and  active.  Exercising still.  Lost weight with Wellbutrin.  Jobless not good. Anxious at times..  But kids around.  Function is ok.   Doesn't eat much protein.  Eats peanut butter, no eggs, no meat.   Sleeping with Belsomra and Xanax. 9-10 hours  CO dry mouth and cavities.  Metal taste in her mouth.  Easily confused.     Chronic marital stress with intense, potentially angry husband.  Long history of severe treatment resistant depression having failed multiple medications including imipramine,  paroxeine , nefazodone,   Viibryd, Prozac, venlafaxine, Emsam 9 mg, Wellbutrin, Pristiq, , mirtazapine nefazodone 600 sedation, duloxetine 90 mg, Trintellix, lithium 600, lamotrigine ? Response, Deplin, TMS, Latuda, lithium, olanzapine, quetiapine, Rexulti caused NMS,  Vyvanse,  pramipexole, Abilify, ropinirole,  ProSom, Belsomra,  Vyvanse,  Provigil,  temazepam, Xanax, doxepin, Lunesta, gabapentin, Zofran, and she did respond to ECT.,   NAC  Review of Systems:  Review of Systems  Constitutional: Positive for fatigue.  HENT: Positive for dental problem.   Gastrointestinal: Negative for abdominal pain.  Neurological: Negative for tremors and weakness.  Psychiatric/Behavioral: Positive for depression and dysphoric mood. Negative for agitation, behavioral problems, confusion, decreased concentration, hallucinations, self-injury, sleep disturbance and suicidal ideas. The patient is nervous/anxious. The patient is not hyperactive.     Medications: I have reviewed the patient's current medications.  Current Outpatient Medications  Medication Sig Dispense Refill  . ALPRAZolam (XANAX) 1 MG tablet TAKE 1 TABLET BY MOUTH THREE TIMES A DAY AS NEEDED FOR ANXIETY 35 tablet 1  . buPROPion (WELLBUTRIN XL) 150 MG 24 hr tablet Take 3 tablets (450 mg total) by mouth daily. (Patient taking differently: Take 300 mg by mouth daily. ) 270 tablet 0  . Cholecalciferol (VITAMIN D) 125 MCG (5000 UT) CAPS Take 5,000 Units  by mouth daily.     . Cyanocobalamin (VITAMIN B 12 PO) Take by mouth.    . lithium carbonate (ESKALITH) 450 MG CR tablet TAKE 1 TABLET BY MOUTH AT BEDTIME 30 tablet 2  . nefazodone (SERZONE) 200 MG tablet TAKE 1 & 1/2 TABLETS BY MOUTH EVERY DAY AT BEDTIME. (Patient taking differently: Take 300 mg by mouth at bedtime. ) 135 tablet 3  . OLANZapine (ZYPREXA) 5 MG tablet TAKE 1 TABLET BY MOUTH EVERYDAY AT BEDTIME (Patient taking differently: Take 2.5 mg by mouth at bedtime. ) 90 tablet 2  . Suvorexant (BELSOMRA) 20 MG TABS Take 1 tablet by mouth at bedtime. 30 tablet 5   No current facility-administered medications for this visit.    Medication Side Effects: None  Allergies: No Known Allergies  Past Medical History:  Diagnosis Date  . Abdominal pain   . Anal itching   . Breast inflammation   . Breast nodule    left  . Chronic kidney disease   . Cystocele   .  Decreased libido   . Depression   . Fibroadenoma   . Galactorrhea   . Mastodynia   . Pelvic pain in female   . Pelvic relaxation   . Rectocele   . Serotonin syndrome     Family History  Problem Relation Age of Onset  . Heart disease Mother        angioplasty  . Cancer Father        prostate  . Hypertension Father   . Depression Father   . Bipolar disorder Father   . Rheum arthritis Sister   . Rheum arthritis Brother   . Hypertension Maternal Grandmother   . Diabetes Maternal Grandmother        borderline  . Hypertension Paternal Grandmother   . Arthritis Paternal Grandmother   . Stroke Paternal Grandmother   . Rheum arthritis Paternal Grandfather   . Breast cancer Neg Hx    FHx: sister on olanzapine 5mg  and fluoxetine.  Social History   Socioeconomic History  . Marital status: Married    Spouse name: Not on file  . Number of children: 3  . Years of education: College  . Highest education level: Not on file  Occupational History  . Occupation: Surveyor, mining: Futures trader at [employer]   Tobacco Use  . Smoking status: Current Every Day Smoker    Packs/day: 0.50    Types: Cigarettes  . Smokeless tobacco: Never Used  Substance and Sexual Activity  . Alcohol use: Yes    Alcohol/week: 1.0 standard drinks    Types: 1 Glasses of wine per week    Comment: one drink about once a month  . Drug use: No  . Sexual activity: Yes    Birth control/protection: Other-see comments    Comment: pt had hyst  Other Topics Concern  . Not on file  Social History Narrative  . Not on file   Social Determinants of Health   Financial Resource Strain:   . Difficulty of Paying Living Expenses: Not on file  Food Insecurity:   . Worried About Charity fundraiser in the Last Year: Not on file  . Ran Out of Food in the Last Year: Not on file  Transportation Needs:   . Lack of Transportation (Medical): Not on file  . Lack of Transportation (Non-Medical): Not on file  Physical Activity:   . Days of Exercise per Week: Not on file  . Minutes of Exercise per Session: Not on file  Stress:   . Feeling of Stress : Not on file  Social Connections:   . Frequency of Communication with Friends and Family: Not on file  . Frequency of Social Gatherings with Friends and Family: Not on file  . Attends Religious Services: Not on file  . Active Member of Clubs or Organizations: Not on file  . Attends Archivist Meetings: Not on file  . Marital Status: Not on file  Intimate Partner Violence:   . Fear of Current or Ex-Partner: Not on file  . Emotionally Abused: Not on file  . Physically Abused: Not on file  . Sexually Abused: Not on file    Past Medical History, Surgical history, Social history, and Family history were reviewed and updated as appropriate.   Please see review of systems for further details on the patient's review from today.   Objective:   Physical Exam:  LMP 05/20/2010   Physical Exam Constitutional:      General: She is not in  acute distress.    Appearance: She is  well-developed.  Musculoskeletal:        General: No deformity.  Neurological:     Mental Status: She is alert and oriented to person, place, and time.     Cranial Nerves: No dysarthria.     Coordination: Coordination normal.  Psychiatric:        Attention and Perception: Attention and perception normal. She does not perceive auditory or visual hallucinations.        Mood and Affect: Mood is anxious and depressed. Affect is not labile, blunt, angry or inappropriate.        Speech: Speech normal.        Behavior: Behavior normal. Behavior is cooperative.        Thought Content: Thought content normal. Thought content is not paranoid or delusional. Thought content does not include homicidal or suicidal ideation. Thought content does not include homicidal or suicidal plan.        Cognition and Memory: Cognition and memory normal.        Judgment: Judgment normal.     Comments: Insight intact SI resolved. Dep moderate higher Wellbutrin and lithium     Lab Review:     Component Value Date/Time   NA 140 10/10/2015 1115   K 3.6 10/10/2015 1115   CL 102 10/10/2015 1115   CO2 31 10/10/2015 1115   GLUCOSE 102 (H) 10/10/2015 1115   BUN 11 10/10/2015 1115   CREATININE 0.84 10/10/2015 1115   CALCIUM 9.4 10/10/2015 1115   PROT 6.6 10/10/2015 1115   ALBUMIN 4.5 10/10/2015 1115   AST 26 10/10/2015 1115   ALT 21 10/10/2015 1115   ALKPHOS 60 10/10/2015 1115   BILITOT 0.9 10/10/2015 1115   GFRNONAA >60 10/10/2015 1115   GFRAA >60 10/10/2015 1115       Component Value Date/Time   WBC 7.1 11/16/2017 0950   RBC 4.51 11/16/2017 0950   HGB 15.0 11/16/2017 0950   HCT 44.8 11/16/2017 0950   PLT 209.0 11/16/2017 0950   MCV 99.3 11/16/2017 0950   MCH 33.5 10/10/2015 1115   MCHC 33.5 11/16/2017 0950   RDW 13.0 11/16/2017 0950   LYMPHSABS 1.9 11/16/2017 0950   MONOABS 0.5 11/16/2017 0950   EOSABS 0.2 11/16/2017 0950   BASOSABS 0.0 11/16/2017 0950    Lithium Lvl  Date Value Ref Range  Status  04/11/2019 0.7 0.6 - 1.2 mmol/L Final    Comment:                                     Detection Limit = 0.1                           <0.1 indicates None Detected      No results found for: PHENYTOIN, PHENOBARB, VALPROATE, CBMZ   Lithium level received April 27, 2018 was 0.7.  This is probably adequate to help reduce the risk of relapse of depression post [place].  Therefore no med changes.    Vitamin D level April 27, 2018 on supplement was 60 and adequate. .res Assessment: Plan:    Devida was seen today for depression, sleeping problem and fatigue.  Diagnoses and all orders for this visit:  Recurrent major depression resistant to treatment ([place])  Insomnia due to mental condition  Generalized anxiety disorder  Low vitamin D level  Greater than 50% of 25 min non face to face time with patient was spent on counseling and coordination of care. 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  Reviewed multiple failed medications noted above.  She failed the attempt to wean Zyprexa because of worsening anxiety.  Anxiety is helped with 2.5 mg Zyprexa daily.  Option increase nefazodone again.  Some compliance issues. She doesn't like taking so much meds.  Can't sleep with less than 300 mg nefazodone. Couldn't tolerate 450 mg Wellbutrin DT jitteriness.  Dropped back to 300 mg daily.  Defer per her request NAC 600 mg or more daily.  Won't check her weight but thinks she's losing.  Thinks it's OK.  ? Protein intake.  Emphasized again.  Trial B12 to see if energy is better in the morning.  Her SI is gone after increasing lithium to 450 mg for SI and depression.  Continue this  Discussed potential metabolic side effects associated with atypical antipsychotics, as well as potential risk for movement side effects. Advised pt to contact office if movement side effects occur.   Option Parnate, Marplan, Spravato, maintenance TMS for TRD .  She felt 50% better with last [place] but 100% better  with first round [place].  Counseled patient regarding potential benefits, risks, and side effects of lithium to include potential risk of lithium affecting thyroid and renal function.  Discussed need for periodic lab monitoring to determine drug level and to assess for potential adverse effects.  Counseled patient regarding signs and symptoms of lithium toxicity and advised that they notify office immediately or seek urgent medical attention if experiencing these signs and symptoms.  Patient advised to contact office with any questions or concerns.  Disc lithium and toxicity. Lithium level good from April 11, 2019 at 0.7.   Option maintenance TMS bc it did help.  Says insurance won't cover except course every 6 mos.   See PCP aobut balance px.  May need neuro consult.  FU 2-3 mos  Lynder Parents, MD, DFAPA    Please see After Visit Summary for patient specific instructions.  No future appointments.  No orders of the defined types were placed in this encounter.    Lynder Parents, MD, DFAPA   -------------------------------

## 2019-05-08 NOTE — Patient Instructions (Addendum)
Read about Nardil and Marplan  Which are MAO inhibitors.  Talk to PCP about balance.

## 2019-05-13 ENCOUNTER — Other Ambulatory Visit: Payer: Self-pay | Admitting: Psychiatry

## 2019-05-14 ENCOUNTER — Telehealth: Payer: Self-pay | Admitting: Psychiatry

## 2019-05-14 NOTE — Telephone Encounter (Signed)
Pt needs refill on WELLBUTRIN. [facility] [address].

## 2019-05-14 NOTE — Telephone Encounter (Signed)
This has already been submitted to pharmacy

## 2019-06-14 ENCOUNTER — Telehealth: Payer: Self-pay | Admitting: Psychiatry

## 2019-06-14 ENCOUNTER — Other Ambulatory Visit: Payer: Self-pay

## 2019-06-14 DIAGNOSIS — F5105 Insomnia due to other mental disorder: Secondary | ICD-10-CM

## 2019-06-14 MED ORDER — ALPRAZOLAM 1 MG PO TABS: ORAL_TABLET | ORAL | 1 refills | Status: DC

## 2019-06-14 NOTE — Telephone Encounter (Signed)
Last refill 05/14/2019, RX for Xanax 1 mg pended for Dr. Clovis Pu

## 2019-06-14 NOTE — Telephone Encounter (Signed)
Pt needs refill on XANAX 1MG  sent to [facility] [address].

## 2019-07-05 ENCOUNTER — Other Ambulatory Visit: Payer: Self-pay | Admitting: Psychiatry

## 2019-07-05 DIAGNOSIS — F339 Major depressive disorder, recurrent, unspecified: Secondary | ICD-10-CM

## 2019-07-05 DIAGNOSIS — F411 Generalized anxiety disorder: Secondary | ICD-10-CM

## 2019-07-11 ENCOUNTER — Telehealth: Payer: Self-pay | Admitting: Psychiatry

## 2019-07-11 NOTE — Telephone Encounter (Signed)
The closest option would be Viibryd which she had taken fairly recently at 40 mg daily or higher doses of trazodone such as 200 to 300 mg daily.  I would prefer 1 or the other of those 2 options until I am able to meet with her in an appointment.  If she has a preference we will go with her preference if she does not then we would try the trazodone as we know that would certainly help her sleep.

## 2019-07-11 NOTE — Telephone Encounter (Signed)
[name] called to report that the nefazondone is only be made my one manufacturer so it is backordered and there is not any available.  She has had to stop it cold [place] since she can't get it.  This is causing other issues.  She has an appt new Tuesday, 4/13 but needs to know what to do and perhaps needs adjustment in other meds. Before the appt.  Please call.

## 2019-07-12 ENCOUNTER — Other Ambulatory Visit: Payer: Self-pay

## 2019-07-12 MED ORDER — TRAZODONE HCL 100 MG PO TABS: ORAL_TABLET | ORAL | 0 refills | Status: DC

## 2019-07-12 NOTE — Telephone Encounter (Signed)
She should not pick up the samples of Viibryd 2 packs and remember to take it with food.  She does not need to wait a full week in between dosage increases and try to increase from 10-20 and then to 40 over about a 10-day.  Or even faster if she tolerates it.  The main side effect risk and going up quickly could be diarrhea or possibly nausea.  She can slow the increase if needed.  She will feel better the faster she gets to 40 mg.

## 2019-07-12 NOTE — Telephone Encounter (Signed)
There was a change after this message, since then patient preferred to try Trazodone instead. Advised to bring samples back at next visit to be discussed. Rx for Trazodone 100 mg 1-2 at hs #60 submitted.

## 2019-07-17 ENCOUNTER — Encounter: Payer: Self-pay | Admitting: Psychiatry

## 2019-07-17 ENCOUNTER — Other Ambulatory Visit: Payer: Self-pay

## 2019-07-17 ENCOUNTER — Ambulatory Visit (INDEPENDENT_AMBULATORY_CARE_PROVIDER_SITE_OTHER): Payer: BC Managed Care – PPO | Admitting: Psychiatry

## 2019-07-17 DIAGNOSIS — F339 Major depressive disorder, recurrent, unspecified: Secondary | ICD-10-CM

## 2019-07-17 DIAGNOSIS — F5105 Insomnia due to other mental disorder: Secondary | ICD-10-CM | POA: Diagnosis not present

## 2019-07-17 DIAGNOSIS — F411 Generalized anxiety disorder: Secondary | ICD-10-CM | POA: Diagnosis not present

## 2019-07-17 DIAGNOSIS — R7989 Other specified abnormal findings of blood chemistry: Secondary | ICD-10-CM

## 2019-07-17 MED ORDER — TRAZODONE HCL 100 MG PO TABS: 300.0000 mg | ORAL_TABLET | Freq: Every day | ORAL | 0 refills | Status: DC

## 2019-07-17 MED ORDER — BELSOMRA 20 MG PO TABS: 1.0000 | ORAL_TABLET | Freq: Every day | ORAL | 5 refills | Status: DC

## 2019-07-17 NOTE — Progress Notes (Signed)
[name] [id number] [birth year] 54 y.o.     Subjective:   Patient ID:  [name] is a 54 y.o. (DOB November 21, 1965) female.  Chief Complaint:  Chief Complaint  Patient presents with  . Sleeping Problem  . Depression  . Anxiety  . Follow-up    Med changes    Depression        Associated symptoms include fatigue and appetite change.  Associated symptoms include no decreased concentration and no suicidal ideas. Medication Refill Associated symptoms include fatigue. Pertinent negatives include no abdominal pain or weakness.    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  At visit March 2020.  She had completed [place] early December.  "It worked"  and had resolution of her depression!  She started Wellbutrin XL 300 mg daily for relapse prevention.  She continued olanzapine 2.5 mg every afternoon because her anxiety relapsed when she would try to completely discontinue it.  Stopped lithium DT SE and felt worse and restarted 300 daily.   In may 2020 pt taking lithium 600 mg and CO balance problems.  She stopped lithium and lithium level at 62 hours later was 0.4, suggesting possible prior toxicity so the lithium was reduced to 300 mg.    in June.  Because Wellbutrin had affected her sleep we switched it to Wellbutrin SR 200 mg every morning and encouraged her to reduce caffeine.  She had felt well but Wellbutrin XL 300 mg made her edgy and caused some insomnia.  She was also encouraged to take NAC as a supplement daily.  She called back September 14 stating that she wanted to switch back to Wellbutrin XL 300 mg because she felt that SR 200 mg was not working sufficiently.  She was cautioned about it contributing to insomnia because there was a concern and had done that in the past.  At visit January 15, 2019 the following was noted: Started a new job temporarily which didn't work.  Was so  nervous going and needed 1/2 Xanax to go to work.  Couldn't meed the schedule demands Still has balance issues and memory. She reduced the lithium from 300 to 150 mg daily bc she doesn't like taking a lot of meds and husband doesn't either.  She made the change on her own. Restarted [name] July August 2020 and felt much better.  Seeing [name] at [facility].  .  Mostly resolved with this tx and finished end of August.  Was good for a month and then the depression returned.  Kind of avoiding people, not severe.  But had periods of "horrific suicidal thoughts" though comments to safety.   Siister died 2023-01-26 COPD and was close to her.   visit Jan 15, 2019 and increased lithium to 450, suggested B12 for energy.  seen March 27, 2019.  The following was noted: Anxiety is helped with 2.5 mg Zyprexa daily. Option increase nefazodone again.  Some compliance issues. She doesn't like taking so much meds. incr Wellbutrin XL 450 mg AM if tolerated.  Disc SE including more anxiety, insomnia. Trial B12 to see if energy is better in the morning. Her SI is gone after increasing lithium to 450 mg for SI and depression.  Continue this She continues sleep meds including Belsomra 20 mg nightly Requested lithium level  Last seen May 08, 2019.  The following was noted Increased Wellbutrin 450 too much jitteriness.  So reduced to 300  mg. Terrible STM. Loses track of thoughts in the middle of sentences.  Poor penmanship comes and goes. Sometimes when walks right foot sticks to the ground with one fall.   So tired.   SI resolved overnight with increased lithium.  Still having problems with balance.  Some word-finding issues and anxiety.  Energy is better.  Sleep is good at this time. Some benefit with incr Wellbutrin.  Exhausted.  At times has to lay down.  Hard to get up and go work out in the morning.  Lower nefazadone 200 mg did not help her energy and her sleep was worse.     But still tired.  During  [place] no depression and no SI but still tired.   Depression has recurred to a moderate level anxiety is still moderate. Stays busy and active.  Exercising still.  Lost weight with Wellbutrin.  Jobless not good. Anxious at times..  But kids around.  Function is ok.  Doesn't eat much protein.  Eats peanut butter, no eggs, no meat.   Sleeping with Belsomra and Xanax. 9-10 hours CO dry mouth and cavities.  Metal taste in her mouth.  Easily confused.   No meds were changed.  By early 2021 nefazodone was no longer manufactured and then became unavailable to her abruptly.  She called July 11, 2019 and the 2 best options were felt to be either switch to higher dose trazodone though it is probably not as effective as an antidepressant as is nefazodone or the other option Viibryd.  She chose to switch to trazodone because when she discontinued nefazodone her initial chief complaint was insomnia.   Hit a wall with abrupt stopping of nefazodone.  Still feels wired. Up at night cleaning.  No wreckless impulsivity.  Head is fuzzy.   Started trazodone 100 mg is helping some. Last 2 nights forgot meds.  Sleep fair with trazodone.  No SE.   Felt wired off nefazodone Down to 1/2 Xanax for 5 days or longer.  Wants to stop it. Don't feel depressed. Lost appetite off nefazodone and losing weight.   Chronic marital stress with intense, potentially angry husband.  Long history of severe treatment resistant depression having failed multiple medications including imipramine,  paroxeine , nefazodone,   Viibryd, Prozac, venlafaxine, Emsam 9 mg, Wellbutrin, Pristiq, , mirtazapine nefazodone 600 sedation, duloxetine 90 mg, Trintellix, lithium 600, lamotrigine ? Response, Deplin, TMS, Latuda, lithium, olanzapine, quetiapine, Rexulti caused NMS,  Vyvanse,  pramipexole, Abilify, ropinirole,  ProSom, Belsomra,  Vyvanse,  Provigil,  temazepam, Xanax, doxepin, Lunesta, gabapentin, Zofran, and she did respond to ECT.,    NAC  Review of Systems:  Review of Systems  Constitutional: Positive for appetite change, fatigue and unexpected weight change.  HENT: Positive for dental problem.   Gastrointestinal: Negative for abdominal pain.  Neurological: Negative for tremors and weakness.  Psychiatric/Behavioral: Positive for depression and dysphoric mood. Negative for agitation, behavioral problems, confusion, decreased concentration, hallucinations, self-injury, sleep disturbance and suicidal ideas. The patient is nervous/anxious. The patient is not hyperactive.     Medications: I have reviewed the patient's current medications.  Current Outpatient Medications  Medication Sig Dispense Refill  . ALPRAZolam (XANAX) 1 MG tablet TAKE 1 TABLET BY MOUTH THREE TIMES A DAY AS NEEDED FOR ANXIETY 30 tablet 1  . buPROPion (WELLBUTRIN XL) 150 MG 24 hr tablet Take 3 tablets (450 mg total) by mouth daily. (Patient taking differently: Take 300 mg by mouth daily. ) 270 tablet 0  . buPROPion [facility]  XL) 300 MG 24 hr tablet TAKE 1 TABLET BY MOUTH EVERY DAY 90 tablet 0  . Cholecalciferol (VITAMIN D) 125 MCG (5000 UT) CAPS Take 5,000 Units by mouth daily.     . Cyanocobalamin (VITAMIN B 12 PO) Take by mouth.    . lithium carbonate (ESKALITH) 450 MG CR tablet TAKE 1 TABLET BY MOUTH AT BEDTIME 30 tablet 2  . OLANZapine (ZYPREXA) 5 MG tablet TAKE 1 TABLET BY MOUTH EVERYDAY AT BEDTIME (Patient taking differently: 2.5 mg. TAKE 1 TABLET BY MOUTH EVERYDAY AT BEDTIME) 90 tablet 3  . Suvorexant (BELSOMRA) 20 MG TABS Take 1 tablet by mouth at bedtime. 30 tablet 5  . traZODone (DESYREL) 100 MG tablet Take 3 tablets (300 mg total) by mouth at bedtime. 270 tablet 0  . nefazodone (SERZONE) 200 MG tablet TAKE 1 & 1/2 TABLETS BY MOUTH EVERY DAY AT BEDTIME. (Patient not taking: No sig reported) 135 tablet 3   No current facility-administered medications for this visit.    Medication Side Effects: None  Allergies: No Known Allergies  Past  Medical History:  Diagnosis Date  . Abdominal pain   . Anal itching   . Breast inflammation   . Breast nodule    left  . Chronic kidney disease   . Cystocele   . Decreased libido   . Depression   . Fibroadenoma   . Galactorrhea   . Mastodynia   . Pelvic pain in female   . Pelvic relaxation   . Rectocele   . Serotonin syndrome     Family History  Problem Relation Age of Onset  . Heart disease Mother        angioplasty  . Cancer Father        prostate  . Hypertension Father   . Depression Father   . Bipolar disorder Father   . Rheum arthritis Sister   . Rheum arthritis Brother   . Hypertension Maternal Grandmother   . Diabetes Maternal Grandmother        borderline  . Hypertension Paternal Grandmother   . Arthritis Paternal Grandmother   . Stroke Paternal Grandmother   . Rheum arthritis Paternal Grandfather   . Breast cancer Neg Hx    FHx: sister on olanzapine 5mg  and fluoxetine.  Social History   Socioeconomic History  . Marital status: Married    Spouse name: Not on file  . Number of children: 3  . Years of education: College  . Highest education level: Not on file  Occupational History  . Occupation: Surveyor, mining: Futures trader at [employer]  Tobacco Use  . Smoking status: Current Every Day Smoker    Packs/day: 0.50    Types: Cigarettes  . Smokeless tobacco: Never Used  Substance and Sexual Activity  . Alcohol use: Yes    Alcohol/week: 1.0 standard drinks    Types: 1 Glasses of wine per week    Comment: one drink about once a month  . Drug use: No  . Sexual activity: Yes    Birth control/protection: Other-see comments    Comment: pt had hyst  Other Topics Concern  . Not on file  Social History Narrative  . Not on file   Social Determinants of Health   Financial Resource Strain:   . Difficulty of Paying Living Expenses:   Food Insecurity:   . Worried About Charity fundraiser in the Last Year:   . Luana in the Last  Year:  Transportation Needs:   . Film/video editor (Medical):   Marland Kitchen Lack of Transportation (Non-Medical):   Physical Activity:   . Days of Exercise per Week:   . Minutes of Exercise per Session:   Stress:   . Feeling of Stress :   Social Connections:   . Frequency of Communication with Friends and Family:   . Frequency of Social Gatherings with Friends and Family:   . Attends Religious Services:   . Active Member of Clubs or Organizations:   . Attends Archivist Meetings:   Marland Kitchen Marital Status:   Intimate Partner Violence:   . Fear of Current or Ex-Partner:   . Emotionally Abused:   Marland Kitchen Physically Abused:   . Sexually Abused:     Past Medical History, Surgical history, Social history, and Family history were reviewed and updated as appropriate.   Please see review of systems for further details on the patient's review from today.   Objective:   Physical Exam:  LMP 05/20/2010   Physical Exam Constitutional:      General: She is not in acute distress.    Appearance: She is well-developed.  Musculoskeletal:        General: No deformity.  Neurological:     Mental Status: She is alert and oriented to person, place, and time.     Cranial Nerves: No dysarthria.     Coordination: Coordination normal.  Psychiatric:        Attention and Perception: Attention and perception normal. She does not perceive auditory or visual hallucinations.        Mood and Affect: Mood is anxious. Mood is not depressed. Affect is not labile, blunt, angry or inappropriate.        Speech: Speech normal.        Behavior: Behavior normal. Behavior is cooperative.        Thought Content: Thought content normal. Thought content is not paranoid or delusional. Thought content does not include homicidal or suicidal ideation. Thought content does not include homicidal or suicidal plan.        Cognition and Memory: Cognition and memory normal.        Judgment: Judgment normal.     Comments: Insight  intact SI resolved. Dep better than last visit     Lab Review:     Component Value Date/Time   NA 140 10/10/2015 1115   K 3.6 10/10/2015 1115   CL 102 10/10/2015 1115   CO2 31 10/10/2015 1115   GLUCOSE 102 (H) 10/10/2015 1115   BUN 11 10/10/2015 1115   CREATININE 0.84 10/10/2015 1115   CALCIUM 9.4 10/10/2015 1115   PROT 6.6 10/10/2015 1115   ALBUMIN 4.5 10/10/2015 1115   AST 26 10/10/2015 1115   ALT 21 10/10/2015 1115   ALKPHOS 60 10/10/2015 1115   BILITOT 0.9 10/10/2015 1115   GFRNONAA >60 10/10/2015 1115   GFRAA >60 10/10/2015 1115       Component Value Date/Time   WBC 7.1 11/16/2017 0950   RBC 4.51 11/16/2017 0950   HGB 15.0 11/16/2017 0950   HCT 44.8 11/16/2017 0950   PLT 209.0 11/16/2017 0950   MCV 99.3 11/16/2017 0950   MCH 33.5 10/10/2015 1115   MCHC 33.5 11/16/2017 0950   RDW 13.0 11/16/2017 0950   LYMPHSABS 1.9 11/16/2017 0950   MONOABS 0.5 11/16/2017 0950   EOSABS 0.2 11/16/2017 0950   BASOSABS 0.0 11/16/2017 0950    Lithium Lvl  Date Value Ref Range Status  04/11/2019  0.7 0.6 - 1.2 mmol/L Final    Comment:                                     Detection Limit = 0.1                           <0.1 indicates None Detected      No results found for: PHENYTOIN, PHENOBARB, VALPROATE, CBMZ   Lithium level received April 27, 2018 was 0.7.  This is probably adequate to help reduce the risk of relapse of depression post [place].  Therefore no med changes.    Vitamin D level April 27, 2018 on supplement was 60 and adequate. .res Assessment: Plan:    Tae was seen today for sleeping problem, depression, anxiety and follow-up.  Diagnoses and all orders for this visit:  Recurrent major depression resistant to treatment ([place]) -     traZODone (DESYREL) 100 MG tablet; Take 3 tablets (300 mg total) by mouth at bedtime.  Insomnia due to mental condition -     traZODone (DESYREL) 100 MG tablet; Take 3 tablets (300 mg total) by mouth at bedtime. -      Suvorexant (BELSOMRA) 20 MG TABS; Take 1 tablet by mouth at bedtime.  Generalized anxiety disorder -     traZODone (DESYREL) 100 MG tablet; Take 3 tablets (300 mg total) by mouth at bedtime.  Low vitamin D level    Greater than 50% of 25 min non face to face time with patient was spent on counseling and coordination of care. 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  Reviewed multiple failed medications noted above.  She failed the attempt to wean Zyprexa because of worsening anxiety.  Anxiety is helped with 2.5 mg Zyprexa daily.  Increase trazodone to 200 mg nightly for several nights then increase to 300 mg nightly. Couldn't tolerate 450 mg Wellbutrin DT jitteriness.  Dropped back to 300 mg daily.  Defer per her request NAC 600 mg or more daily.  Won't check her weight but thinks she's losing.  Realizes that needs to stop.  ? Protein intake.  Emphasized again.  Trial B12 to see if energy is better in the morning.  Her SI is gone after increasing lithium to 450 mg for SI and depression.  Continue this.  SI not recurred.  Discussed potential metabolic side effects associated with atypical antipsychotics, as well as potential risk for movement side effects. Advised pt to contact office if movement side effects occur.   Option Parnate, Marplan, Spravato, maintenance TMS for TRD .  She felt 50% better with last [place] but 100% better with first round [place].  Counseled patient regarding potential benefits, risks, and side effects of lithium to include potential risk of lithium affecting thyroid and renal function.  Discussed need for periodic lab monitoring to determine drug level and to assess for potential adverse effects.  Counseled patient regarding signs and symptoms of lithium toxicity and advised that they notify office immediately or seek urgent medical attention if experiencing these signs and symptoms.  Patient advised to contact office with any questions or concerns.  Disc lithium and  toxicity. Lithium level good from April 11, 2019 at 0.7.   Disc DDI Xanax and nefazodone so don't decrease further for at least a month.  Option maintenance TMS bc it did help.  Says insurance won't cover except course  every 6 mos.   See PCP aobut balance px.  May need neuro consult.  FU 6 weeks  Lynder Parents, MD, DFAPA    Please see After Visit Summary for patient specific instructions.  Future Appointments  Date Time Provider [place]  08/23/2019  1:00 PM Cottle, Billey Co., MD CP-[facility] None    No orders of the defined types were placed in this encounter.    Lynder Parents, MD, DFAPA   -------------------------------

## 2019-07-17 NOTE — Patient Instructions (Signed)
Increase trazodone to 100 mg tablets 2 each night for 5-7 nights then increase to 3 nightly.

## 2019-07-18 ENCOUNTER — Telehealth: Payer: Self-pay | Admitting: Psychiatry

## 2019-07-18 ENCOUNTER — Other Ambulatory Visit: Payer: Self-pay

## 2019-07-18 MED ORDER — BUPROPION HCL ER (XL) 300 MG PO TB24: 300.0000 mg | ORAL_TABLET | Freq: Every day | ORAL | 0 refills | Status: DC

## 2019-07-18 NOTE — Telephone Encounter (Signed)
[name] called to request refill of her Wellbutrin 300mg .  She forgot to request this at appt yesterday.  At one point she was taking 1 1/2 tabs for total of 450mg  so she is out early. Shedidn't like that strength so just wants the 300mg .  [facility] send to [facility] on [address].

## 2019-07-18 NOTE — Telephone Encounter (Signed)
Rx submitted for Wellbutrin XL 300 mg #90

## 2019-07-20 ENCOUNTER — Other Ambulatory Visit: Payer: Self-pay | Admitting: Psychiatry

## 2019-07-20 NOTE — Telephone Encounter (Signed)
Pt went to pick up rx for bupropion and they told her that she couldn't pick up until 4/23. Pt only has about 5 pills left because she was taking 1 1/2 qd. [facility] to [facility] [address].

## 2019-07-20 NOTE — Telephone Encounter (Signed)
Left patient message explaining her insurance won't pay until 04/23, she can either pay out of pocket for a few days or we can submit 150 mg and she just take 2. Instructed her to call back with her preference.

## 2019-07-30 ENCOUNTER — Telehealth: Payer: Self-pay | Admitting: Psychiatry

## 2019-07-30 NOTE — Telephone Encounter (Signed)
Pt called stating she is experiencing muscle weakness. She think it may be a side effect of her medication. Please advise.

## 2019-07-31 NOTE — Telephone Encounter (Signed)
Left message to call back with more information

## 2019-08-02 NOTE — Telephone Encounter (Signed)
Left patient a message to call back about her side effects

## 2019-08-20 ENCOUNTER — Other Ambulatory Visit: Payer: Self-pay | Admitting: Psychiatry

## 2019-08-20 DIAGNOSIS — F5105 Insomnia due to other mental disorder: Secondary | ICD-10-CM

## 2019-08-23 ENCOUNTER — Ambulatory Visit: Payer: BC Managed Care – PPO | Admitting: Psychiatry

## 2019-08-30 ENCOUNTER — Telehealth: Payer: Self-pay | Admitting: Psychiatry

## 2019-08-30 ENCOUNTER — Other Ambulatory Visit: Payer: Self-pay

## 2019-08-30 DIAGNOSIS — F339 Major depressive disorder, recurrent, unspecified: Secondary | ICD-10-CM

## 2019-08-30 MED ORDER — BUPROPION HCL ER (XL) 150 MG PO TB24: ORAL_TABLET | ORAL | 1 refills | Status: DC

## 2019-08-30 NOTE — Telephone Encounter (Signed)
Pt called to ask if she can increase the dose of her Wellbutrin from 300mg  to 450mg . She feels better on the higher dose.

## 2019-08-31 NOTE — Telephone Encounter (Signed)
Noted! Thank you

## 2019-08-31 NOTE — Telephone Encounter (Signed)
She tried increase Wellbutrin to 450 in Feb and said it made her too jittery.  She can try it again.  I would advise avoiding caffeine with it

## 2019-09-13 ENCOUNTER — Other Ambulatory Visit: Payer: Self-pay | Admitting: Psychiatry

## 2019-09-13 DIAGNOSIS — F411 Generalized anxiety disorder: Secondary | ICD-10-CM

## 2019-09-13 DIAGNOSIS — F5105 Insomnia due to other mental disorder: Secondary | ICD-10-CM

## 2019-09-13 DIAGNOSIS — F339 Major depressive disorder, recurrent, unspecified: Secondary | ICD-10-CM

## 2019-09-15 ENCOUNTER — Other Ambulatory Visit: Payer: Self-pay | Admitting: Psychiatry

## 2019-09-15 DIAGNOSIS — F339 Major depressive disorder, recurrent, unspecified: Secondary | ICD-10-CM

## 2019-09-15 DIAGNOSIS — F411 Generalized anxiety disorder: Secondary | ICD-10-CM

## 2019-09-17 ENCOUNTER — Ambulatory Visit (INDEPENDENT_AMBULATORY_CARE_PROVIDER_SITE_OTHER): Payer: BC Managed Care – PPO | Admitting: Psychiatry

## 2019-09-17 ENCOUNTER — Encounter: Payer: Self-pay | Admitting: Psychiatry

## 2019-09-17 ENCOUNTER — Other Ambulatory Visit: Payer: Self-pay

## 2019-09-17 DIAGNOSIS — R7989 Other specified abnormal findings of blood chemistry: Secondary | ICD-10-CM | POA: Diagnosis not present

## 2019-09-17 DIAGNOSIS — F411 Generalized anxiety disorder: Secondary | ICD-10-CM | POA: Diagnosis not present

## 2019-09-17 DIAGNOSIS — F339 Major depressive disorder, recurrent, unspecified: Secondary | ICD-10-CM

## 2019-09-17 DIAGNOSIS — F5105 Insomnia due to other mental disorder: Secondary | ICD-10-CM

## 2019-09-17 NOTE — Progress Notes (Signed)
[name] [id number] [birth year] 54 y.o.     Subjective:   Patient ID:  [name] is a 54 y.o. (DOB 1965-09-26) female.  Chief Complaint:  Chief Complaint  Patient presents with  . Follow-up  . Depression  . Sleeping Problem    Depression        Associated symptoms include no decreased concentration, no fatigue, no appetite change and no suicidal ideas. Medication Refill Pertinent negatives include no abdominal pain, fatigue or weakness.    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  At visit March 2020.  She had completed [place] early December.  "It worked"  and had resolution of her depression!  She started Wellbutrin XL 300 mg daily for relapse prevention.  She continued olanzapine 2.5 mg every afternoon because her anxiety relapsed when she would try to completely discontinue it.  Stopped lithium DT SE and felt worse and restarted 300 daily.   In may 2020 pt taking lithium 600 mg and CO balance problems.  She stopped lithium and lithium level at 62 hours later was 0.4, suggesting possible prior toxicity so the lithium was reduced to 300 mg.    in June.  Because Wellbutrin had affected her sleep we switched it to Wellbutrin SR 200 mg every morning and encouraged her to reduce caffeine.  She had felt well but Wellbutrin XL 300 mg made her edgy and caused some insomnia.  She was also encouraged to take NAC as a supplement daily.  She called back September 14 stating that she wanted to switch back to Wellbutrin XL 300 mg because she felt that SR 200 mg was not working sufficiently.  She was cautioned about it contributing to insomnia because there was a concern and had done that in the past.  At visit January 15, 2019 the following was noted: Started a new job temporarily which didn't work.  Was so nervous going and needed 1/2 Xanax to go to work.  Couldn't meed the schedule  demands Still has balance issues and memory. She reduced the lithium from 300 to 150 mg daily bc she doesn't like taking a lot of meds and husband doesn't either.  She made the change on her own. Restarted [place] July August 2020 and felt much better.  Seeing [name] at [facility].  .  Mostly resolved with this tx and finished end of August.  Was good for a month and then the depression returned.  Kind of avoiding people, not severe.  But had periods of "horrific suicidal thoughts" though comments to safety.   Siister died 2023/01/08 COPD and was close to her.   visit Jan 15, 2019 and increased lithium to 450, suggested B12 for energy.  seen March 27, 2019.  The following was noted: Anxiety is helped with 2.5 mg Zyprexa daily. Option increase nefazodone again.  Some compliance issues. She doesn't like taking so much meds. incr Wellbutrin XL 450 mg AM if tolerated.  Disc SE including more anxiety, insomnia. Trial B12 to see if energy is better in the morning. Her SI is gone after increasing lithium to 450 mg for SI and depression.  Continue this She continues sleep meds including Belsomra 20 mg nightly Requested lithium level  Last seen May 08, 2019.  The following was noted Increased Wellbutrin 450 too much jitteriness.  So reduced to 300 mg. Terrible STM. Loses track of thoughts in the middle of sentences.  Poor  penmanship comes and goes. Sometimes when walks right foot sticks to the ground with one fall.   So tired.   SI resolved overnight with increased lithium.  Still having problems with balance.  Some word-finding issues and anxiety.  Energy is better.  Sleep is good at this time. Some benefit with incr Wellbutrin.  Exhausted.  At times has to lay down.  Hard to get up and go work out in the morning.  Lower nefazadone 200 mg did not help her energy and her sleep was worse.     But still tired.  During [place] no depression and no SI but still tired.   Depression has recurred to a  moderate level anxiety is still moderate. Stays busy and active.  Exercising still.  Lost weight with Wellbutrin.  Jobless not good. Anxious at times..  But kids around.  Function is ok.  Doesn't eat much protein.  Eats peanut butter, no eggs, no meat.   Sleeping with Belsomra and Xanax. 9-10 hours CO dry mouth and cavities.  Metal taste in her mouth.  Easily confused.   No meds were changed.  By early 2021 nefazodone was no longer manufactured and then became unavailable to her abruptly.  She called July 11, 2019 and the 2 best options were felt to be either switch to higher dose trazodone though it is probably not as effective as an antidepressant as is nefazodone or the other option Viibryd.  She chose to switch to trazodone because when she discontinued nefazodone her initial chief complaint was insomnia.  07/17/2019 appointment the following is reported:  Hit a wall with abrupt stopping of nefazodone.  Still feels wired. Up at night cleaning.  No wreckless impulsivity.  Head is fuzzy.   Started trazodone 100 mg is helping some. Last 2 nights forgot meds.  Sleep fair with trazodone.  No SE.   Felt wired off nefazodone Down to 1/2 Xanax for 5 days or longer.  Wants to stop it. Don't feel depressed. Lost appetite off nefazodone and losing weight. Recommendation was to increase trazodone gradually to 300 mg nightly if tolerated to help compensate for the absence of nefazodone and hopes of helping depression.  07/30/2019 phone call complaining of muscle weakness. 08/30/2019 phone call asking to increase Wellbutrin from 300 to 450 mg daily.  She had tried this in the past but it made her jittery but it was agreed she could retry it.  09/17/2019 appointment with the following noted: Overall doing well.  Increase trazodone to 300 mg HS and still can't sleep well at times. Feels better with higher dose Wellbutrin and it didn't seem to worsen  Sleep. Tolerating changes except tremor. No taste for  coffee any more.  No smoking in a year.  Wellbutrin helped.  Pilar Plate and kids good.   Better energy and less fatigue.  Less depression. Chronic sleep issues.  No anxiety.  Appetite OK.  Just don't eat like most people but does get protein.  Took a month off nefazodone to adjust.    Long history of severe treatment resistant depression having failed multiple medications including imipramine,  paroxeine , nefazodone,   Viibryd, Prozac, venlafaxine, Emsam 9 mg, Wellbutrin, Pristiq, , mirtazapine nefazodone 600 sedation, duloxetine 90 mg, Trintellix, lithium 600, lamotrigine ? Response, Deplin, TMS, Latuda, lithium, olanzapine, quetiapine, Rexulti caused NMS,  Vyvanse,  pramipexole, Abilify, ropinirole,  ProSom, Belsomra,  Vyvanse,  Provigil,  temazepam, Xanax, doxepin, Lunesta, gabapentin, Zofran, and she did respond to ECT.,   NAC  Review of Systems:  Review of Systems  Constitutional: Negative for appetite change, fatigue and unexpected weight change.  HENT: Negative for dental problem.   Gastrointestinal: Negative for abdominal pain.  Neurological: Negative for tremors and weakness.  Psychiatric/Behavioral: Positive for depression and dysphoric mood. Negative for agitation, behavioral problems, confusion, decreased concentration, hallucinations, self-injury, sleep disturbance and suicidal ideas. The patient is nervous/anxious. The patient is not hyperactive.     Medications: I have reviewed the patient's current medications.  Current Outpatient Medications  Medication Sig Dispense Refill  . ALPRAZolam (XANAX) 1 MG tablet TAKE 1 TABLET BY MOUTH 3 TIMES A DAY AS NEEDED FOR ANXIETY (Patient taking differently: Take 0.5 mg by mouth at bedtime. TAKE 1 TABLET BY MOUTH 3 TIMES A DAY AS NEEDED FOR ANXIETY) 30 tablet 1  . buPROPion (WELLBUTRIN XL) 150 MG 24 hr tablet Take 1 tablet (150 mg) daily with a 300 mg tablet to equal 450 mg 30 tablet 1  . buPROPion (WELLBUTRIN XL) 300 MG 24 hr tablet Take 1  tablet (300 mg total) by mouth daily. 90 tablet 0  . Cholecalciferol (VITAMIN D) 125 MCG (5000 UT) CAPS Take 5,000 Units by mouth daily.     . Cyanocobalamin (VITAMIN B 12 PO) Take by mouth.    . lithium carbonate (ESKALITH) 450 MG CR tablet TAKE 1 TABLET BY MOUTH AT BEDTIME 90 tablet 1  . OLANZapine (ZYPREXA) 5 MG tablet TAKE 1 TABLET BY MOUTH EVERYDAY AT BEDTIME (Patient taking differently: 2.5 mg. TAKE 1 TABLET BY MOUTH EVERYDAY AT BEDTIME) 90 tablet 3  . Suvorexant (BELSOMRA) 20 MG TABS Take 1 tablet by mouth at bedtime. 30 tablet 5  . traZODone (DESYREL) 100 MG tablet TAKE 3 TABLETS BY MOUTH EVERY DAY AT BEDTIME 270 tablet 1   No current facility-administered medications for this visit.    Medication Side Effects: None  Allergies: No Known Allergies  Past Medical History:  Diagnosis Date  . Abdominal pain   . Anal itching   . Breast inflammation   . Breast nodule    left  . Chronic kidney disease   . Cystocele   . Decreased libido   . Depression   . Fibroadenoma   . Galactorrhea   . Mastodynia   . Pelvic pain in female   . Pelvic relaxation   . Rectocele   . Serotonin syndrome     Family History  Problem Relation Age of Onset  . Heart disease Mother        angioplasty  . Cancer Father        prostate  . Hypertension Father   . Depression Father   . Bipolar disorder Father   . Rheum arthritis Sister   . Rheum arthritis Brother   . Hypertension Maternal Grandmother   . Diabetes Maternal Grandmother        borderline  . Hypertension Paternal Grandmother   . Arthritis Paternal Grandmother   . Stroke Paternal Grandmother   . Rheum arthritis Paternal Grandfather   . Breast cancer Neg Hx    FHx: sister on olanzapine 5mg  and fluoxetine.  Social History   Socioeconomic History  . Marital status: Married    Spouse name: Not on file  . Number of children: 3  . Years of education: College  . Highest education level: Not on file  Occupational History  .  Occupation: Surveyor, mining: Futures trader at [employer]  Tobacco Use  . Smoking status: Current Every Day Smoker  Packs/day: 0.50    Types: Cigarettes  . Smokeless tobacco: Never Used  Substance and Sexual Activity  . Alcohol use: Yes    Alcohol/week: 1.0 standard drink    Types: 1 Glasses of wine per week    Comment: one drink about once a month  . Drug use: No  . Sexual activity: Yes    Birth control/protection: Other-see comments    Comment: pt had hyst  Other Topics Concern  . Not on file  Social History Narrative  . Not on file   Social Determinants of Health   Financial Resource Strain:   . Difficulty of Paying Living Expenses:   Food Insecurity:   . Worried About Charity fundraiser in the Last Year:   . Arboriculturist in the Last Year:   Transportation Needs:   . Film/video editor (Medical):   Marland Kitchen Lack of Transportation (Non-Medical):   Physical Activity:   . Days of Exercise per Week:   . Minutes of Exercise per Session:   Stress:   . Feeling of Stress :   Social Connections:   . Frequency of Communication with Friends and Family:   . Frequency of Social Gatherings with Friends and Family:   . Attends Religious Services:   . Active Member of Clubs or Organizations:   . Attends Archivist Meetings:   Marland Kitchen Marital Status:   Intimate Partner Violence:   . Fear of Current or Ex-Partner:   . Emotionally Abused:   Marland Kitchen Physically Abused:   . Sexually Abused:     Past Medical History, Surgical history, Social history, and Family history were reviewed and updated as appropriate.   Please see review of systems for further details on the patient's review from today.   Objective:   Physical Exam:  LMP 05/20/2010   Physical Exam Constitutional:      General: She is not in acute distress.    Appearance: She is well-developed.  Musculoskeletal:        General: No deformity.  Neurological:     Mental Status: She is alert and oriented to  person, place, and time.     Cranial Nerves: No dysarthria.     Coordination: Coordination normal.  Psychiatric:        Attention and Perception: Attention and perception normal. She does not perceive auditory or visual hallucinations.        Mood and Affect: Mood is anxious. Mood is not depressed. Affect is not labile, blunt, angry or inappropriate.        Speech: Speech normal.        Behavior: Behavior normal. Behavior is cooperative.        Thought Content: Thought content normal. Thought content is not paranoid or delusional. Thought content does not include homicidal or suicidal ideation. Thought content does not include homicidal or suicidal plan.        Cognition and Memory: Cognition and memory normal.        Judgment: Judgment normal.     Comments: Insight intact SI resolved. Dep better than last visit     Lab Review:     Component Value Date/Time   NA 140 10/10/2015 1115   K 3.6 10/10/2015 1115   CL 102 10/10/2015 1115   CO2 31 10/10/2015 1115   GLUCOSE 102 (H) 10/10/2015 1115   BUN 11 10/10/2015 1115   CREATININE 0.84 10/10/2015 1115   CALCIUM 9.4 10/10/2015 1115   PROT 6.6 10/10/2015 1115  ALBUMIN 4.5 10/10/2015 1115   AST 26 10/10/2015 1115   ALT 21 10/10/2015 1115   ALKPHOS 60 10/10/2015 1115   BILITOT 0.9 10/10/2015 1115   GFRNONAA >60 10/10/2015 1115   GFRAA >60 10/10/2015 1115       Component Value Date/Time   WBC 7.1 11/16/2017 0950   RBC 4.51 11/16/2017 0950   HGB 15.0 11/16/2017 0950   HCT 44.8 11/16/2017 0950   PLT 209.0 11/16/2017 0950   MCV 99.3 11/16/2017 0950   MCH 33.5 10/10/2015 1115   MCHC 33.5 11/16/2017 0950   RDW 13.0 11/16/2017 0950   LYMPHSABS 1.9 11/16/2017 0950   MONOABS 0.5 11/16/2017 0950   EOSABS 0.2 11/16/2017 0950   BASOSABS 0.0 11/16/2017 0950    Lithium Lvl  Date Value Ref Range Status  04/11/2019 0.7 0.6 - 1.2 mmol/L Final    Comment:                                     Detection Limit = 0.1                            <0.1 indicates None Detected      No results found for: PHENYTOIN, PHENOBARB, VALPROATE, CBMZ   Lithium level received April 27, 2018 was 0.7.  This is probably adequate to help reduce the risk of relapse of depression post [place].  Therefore no med changes.    Vitamin D level April 27, 2018 on supplement was 60 and adequate. .res Assessment: Plan:    Ariza was seen today for follow-up, depression and sleeping problem.  Diagnoses and all orders for this visit:  Recurrent major depression resistant to treatment ([place])  Insomnia due to mental condition  Generalized anxiety disorder  Low vitamin D level    Greater than 50% of 25 min non face to face time with patient was spent on counseling and coordination of care. 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  Reviewed multiple failed medications noted above.  She failed the attempt to wean Zyprexa because of worsening anxiety.  Anxiety is helped with 2.5 mg Zyprexa daily.  Continue trazodone 300 mg nightly. It may be having some beneficial effect for depression at the higher dose. The increase in Wellbutrin back to 450 mg daily has led to better control of depression as well.  She is jittery but tolerating it and find it's benefits are worth the shakiness. Overall depression is better controlled that it has been in some time.  She still has some sleep problems despite several meds to help with sleep.  Defer per her request NAC 600 mg or more daily.  She feels her protein intake is adequate and her weight is stable.  Trial of Dayvigo in place of Belsomra at 5-10 mg HS. if it works better call back and let us know and we will change the prescription.   Trial B12 to see if energy is better in the morning.  Her SI is gone after increasing lithium to 450 mg for SI and depression.  Continue this.  SI not recurred.  Discussed potential metabolic side effects associated with atypical antipsychotics, as well as potential risk for movement  side effects. Advised pt to contact office if movement side effects occur.   Option Parnate, Marplan, Spravato, maintenance TMS for TRD .  She felt 50% better with last [place] but 100%  better with first round [place].  Counseled patient regarding potential benefits, risks, and side effects of lithium to include potential risk of lithium affecting thyroid and renal function.  Discussed need for periodic lab monitoring to determine drug level and to assess for potential adverse effects.  Counseled patient regarding signs and symptoms of lithium toxicity and advised that they notify office immediately or seek urgent medical attention if experiencing these signs and symptoms.  Patient advised to contact office with any questions or concerns.  Disc lithium and toxicity. Lithium level good from April 11, 2019 at 0.7.   Disc DDI Xanax and nefazodone so don't decrease further for at least a month.  Option maintenance TMS bc it did help.  Says insurance won't cover except course every 6 mos.   See PCP aobut balance px.  May need neuro consult.  FU 3 mos  Lynder Parents, MD, DFAPA    Please see After Visit Summary for patient specific instructions.  No future appointments.  No orders of the defined types were placed in this encounter.    Lynder Parents, MD, DFAPA   -------------------------------

## 2019-09-17 NOTE — Telephone Encounter (Signed)
Has apt today 

## 2019-09-25 ENCOUNTER — Other Ambulatory Visit: Payer: Self-pay | Admitting: Psychiatry

## 2019-09-25 DIAGNOSIS — F339 Major depressive disorder, recurrent, unspecified: Secondary | ICD-10-CM

## 2019-09-25 NOTE — Telephone Encounter (Signed)
Need to call patient to check strength of Dayvigo

## 2019-09-25 NOTE — Telephone Encounter (Signed)
Pt requesting Rx for [facility] [address] on [facility]. Had samples and worked well. Apt 9/16

## 2019-09-26 ENCOUNTER — Other Ambulatory Visit: Payer: Self-pay

## 2019-09-26 MED ORDER — DAYVIGO 10 MG PO TABS: 10.0000 mg | ORAL_TABLET | Freq: Every day | ORAL | 2 refills | Status: DC

## 2019-09-27 ENCOUNTER — Telehealth: Payer: Self-pay

## 2019-09-27 NOTE — Telephone Encounter (Signed)
Prior authorization submitted and approved through [facility] TQ#[id number] for [place] 10 MG #30, effective 09/27/2019-09/25/2020

## 2019-10-02 ENCOUNTER — Telehealth: Payer: Self-pay | Admitting: Psychiatry

## 2019-10-02 NOTE — Telephone Encounter (Signed)
[name] wants to discuss change up medication regimen.  Please call to discuss.

## 2019-10-03 NOTE — Telephone Encounter (Signed)
Rtc to patient, she said she had not picked up her Dayvigo yet, so hard to get in contact with pharmacy so she's been taking Belsomra and also using her Alprazolam 1 mg. Asking if she can get a refill on Alprazolam. Informed her she should have refill on file but I did contact pharmacy and they will get that refill ready for her today. Also had them run the [facility] it went through and cost will be $35.00 if she wants to get that as well.   Let her a message with all the information and to call back with further issues.

## 2019-10-18 ENCOUNTER — Other Ambulatory Visit: Payer: Self-pay | Admitting: Psychiatry

## 2019-10-22 ENCOUNTER — Other Ambulatory Visit: Payer: Self-pay | Admitting: Psychiatry

## 2019-10-24 ENCOUNTER — Other Ambulatory Visit: Payer: Self-pay | Admitting: Psychiatry

## 2019-10-24 DIAGNOSIS — F411 Generalized anxiety disorder: Secondary | ICD-10-CM

## 2019-10-24 DIAGNOSIS — F339 Major depressive disorder, recurrent, unspecified: Secondary | ICD-10-CM

## 2019-10-25 ENCOUNTER — Telehealth: Payer: Self-pay | Admitting: Psychiatry

## 2019-10-25 NOTE — Telephone Encounter (Signed)
She's good to go, she was having issues getting [facility]ills [facility] with the [facility]. All 3 Rx's she's requesting is [facility] She's aware

## 2019-10-25 NOTE — Telephone Encounter (Signed)
Pt called stating having issues getting refills for Bupropion and other meds. Ask nurse to return call. [phone]

## 2019-10-31 ENCOUNTER — Other Ambulatory Visit: Payer: Self-pay

## 2019-10-31 ENCOUNTER — Telehealth: Payer: Self-pay | Admitting: Psychiatry

## 2019-10-31 DIAGNOSIS — F5105 Insomnia due to other mental disorder: Secondary | ICD-10-CM

## 2019-10-31 MED ORDER — ALPRAZOLAM 1 MG PO TABS: ORAL_TABLET | ORAL | 2 refills | Status: DC

## 2019-10-31 NOTE — Telephone Encounter (Signed)
[name] called to request refill of her Xanax and early refill of her Belsomra.  She is going on vacation 8/5-8/10 and would like to pickup medications before she leaves.  This will ensure she gets through vacation and has medication when she gets back.  [facility]

## 2019-10-31 NOTE — Telephone Encounter (Signed)
Xanax refills called in. Will contact pharmacy to confirm early refill of Belsomra on 11/17/2019

## 2019-11-01 NOTE — Telephone Encounter (Signed)
Clarification on date 11/07/2019 for fill date

## 2019-11-02 ENCOUNTER — Telehealth: Payer: Self-pay | Admitting: Psychiatry

## 2019-11-02 NOTE — Telephone Encounter (Signed)
Will confirm with her pharmacy that it's okay she gets refills on 11/07/2019, waiting till closer to fill date to contact them.

## 2019-11-02 NOTE — Telephone Encounter (Addendum)
Pt called to report concern about med Belsomra & Xanax. Going out of town 8/5 and return 8/12. Return call  [phone] Will need meds become she returns

## 2019-11-05 ENCOUNTER — Telehealth: Payer: Self-pay | Admitting: Psychiatry

## 2019-11-06 NOTE — Telephone Encounter (Signed)
Confirmed with pharmacist this morning 11/06/2019 that patient can pick up her Belsomra on 11/07/2019. Her refill is actually due that day.  She should not have any issues per pharmacist.

## 2019-11-26 NOTE — Telephone Encounter (Signed)
error 

## 2019-12-11 ENCOUNTER — Telehealth: Payer: Self-pay | Admitting: Psychiatry

## 2019-12-11 NOTE — Telephone Encounter (Signed)
Noted, nothing received from pharmacy but will initiate a new PA for Belsomra. Looks like it expired in August from her last PA

## 2019-12-11 NOTE — Telephone Encounter (Signed)
[facility] and said [facility] [address] [facility] for her Belsoma refills.

## 2019-12-11 NOTE — Telephone Encounter (Signed)
Prior authorization initiated and approved through [facility] [place] for BELSOMRA 20 mg effective today 12/11/2019-12/09/2020.  Patient was contacted as well.

## 2019-12-12 DIAGNOSIS — K219 Gastro-esophageal reflux disease without esophagitis: Secondary | ICD-10-CM | POA: Insufficient documentation

## 2019-12-12 DIAGNOSIS — R059 Cough, unspecified: Secondary | ICD-10-CM | POA: Insufficient documentation

## 2019-12-20 ENCOUNTER — Encounter: Payer: Self-pay | Admitting: Psychiatry

## 2019-12-20 ENCOUNTER — Ambulatory Visit (INDEPENDENT_AMBULATORY_CARE_PROVIDER_SITE_OTHER): Payer: BC Managed Care – PPO | Admitting: Psychiatry

## 2019-12-20 ENCOUNTER — Other Ambulatory Visit: Payer: Self-pay

## 2019-12-20 DIAGNOSIS — F339 Major depressive disorder, recurrent, unspecified: Secondary | ICD-10-CM | POA: Diagnosis not present

## 2019-12-20 DIAGNOSIS — R7989 Other specified abnormal findings of blood chemistry: Secondary | ICD-10-CM | POA: Diagnosis not present

## 2019-12-20 DIAGNOSIS — F5105 Insomnia due to other mental disorder: Secondary | ICD-10-CM | POA: Diagnosis not present

## 2019-12-20 DIAGNOSIS — F411 Generalized anxiety disorder: Secondary | ICD-10-CM | POA: Diagnosis not present

## 2019-12-20 MED ORDER — OLANZAPINE 5 MG PO TABS: 5.0000 mg | ORAL_TABLET | Freq: Every day | ORAL | 1 refills | Status: DC

## 2019-12-20 MED ORDER — BUPROPION HCL ER (XL) 300 MG PO TB24: 300.0000 mg | ORAL_TABLET | Freq: Every day | ORAL | 1 refills | Status: DC

## 2019-12-20 MED ORDER — BELSOMRA 20 MG PO TABS: 1.0000 | ORAL_TABLET | Freq: Every day | ORAL | 5 refills | Status: DC

## 2019-12-20 MED ORDER — LITHIUM CARBONATE 150 MG PO CAPS: 450.0000 mg | ORAL_CAPSULE | Freq: Every evening | ORAL | 0 refills | Status: DC

## 2019-12-20 MED ORDER — ALPRAZOLAM 1 MG PO TABS: ORAL_TABLET | ORAL | 4 refills | Status: DC

## 2019-12-20 NOTE — Progress Notes (Signed)
[name] [id number] Oct 20, 1965 54 y.o.     Subjective:   Patient ID:  [name] is a 54 y.o. (DOB [birth year]) female.  Chief Complaint:  Chief Complaint  Patient presents with  . Follow-up    Medication Management  . Depression    Medication Management  . Sleeping Problem  . Anxiety    Depression        Associated symptoms include decreased concentration.  Associated symptoms include no fatigue, no appetite change and no suicidal ideas. Medication Refill Pertinent negatives include no abdominal pain, fatigue or weakness.    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  At visit March 2020.  She had completed [place] early December.  "It worked"  and had resolution of her depression!  She started Wellbutrin XL 300 mg daily for relapse prevention.  She continued olanzapine 2.5 mg every afternoon because her anxiety relapsed when she would try to completely discontinue it.  Stopped lithium DT SE and felt worse and restarted 300 daily.   In may 2020 pt taking lithium 600 mg and CO balance problems.  She stopped lithium and lithium level at 62 hours later was 0.4, suggesting possible prior toxicity so the lithium was reduced to 300 mg.    in June.  Because Wellbutrin had affected her sleep we switched it to Wellbutrin SR 200 mg every morning and encouraged her to reduce caffeine.  She had felt well but Wellbutrin XL 300 mg made her edgy and caused some insomnia.  She was also encouraged to take NAC as a supplement daily.  She called back September 14 stating that she wanted to switch back to Wellbutrin XL 300 mg because she felt that SR 200 mg was not working sufficiently.  She was cautioned about it contributing to insomnia because there was a concern and had done that in the past.  At visit January 15, 2019 the following was noted: Started a new job temporarily which didn't work.   Was so nervous going and needed 1/2 Xanax to go to work.  Couldn't meed the schedule demands Still has balance issues and memory. She reduced the lithium from 300 to 150 mg daily bc she doesn't like taking a lot of meds and husband doesn't either.  She made the change on her own. Restarted [name] July August 2020 and felt much better.  Seeing [name] at [place].  .  Mostly resolved with this tx and finished end of August.  Was good for a month and then the depression returned.  Kind of avoiding people, not severe.  But had periods of "horrific suicidal thoughts" though comments to safety.   Siister died 01/20/2023 COPD and was close to her.   visit Jan 15, 2019 and increased lithium to 450, suggested B12 for energy.  seen March 27, 2019.  The following was noted: Anxiety is helped with 2.5 mg Zyprexa daily. Option increase nefazodone again.  Some compliance issues. She doesn't like taking so much meds. incr Wellbutrin XL 450 mg AM if tolerated.  Disc SE including more anxiety, insomnia. Trial B12 to see if energy is better in the morning. Her SI is gone after increasing lithium to 450 mg for SI and depression.  Continue this She continues sleep meds including Belsomra 20 mg nightly Requested lithium level  Last seen May 08, 2019.  The following was noted Increased Wellbutrin 450 too much jitteriness.  So reduced  to 300 mg. Terrible STM. Loses track of thoughts in the middle of sentences.  Poor penmanship comes and goes. Sometimes when walks right foot sticks to the ground with one fall.   So tired.   SI resolved overnight with increased lithium.  Still having problems with balance.  Some word-finding issues and anxiety.  Energy is better.  Sleep is good at this time. Some benefit with incr Wellbutrin.  Exhausted.  At times has to lay down.  Hard to get up and go work out in the morning.  Lower nefazadone 200 mg did not help her energy and her sleep was worse.     But still tired.   During [name] no depression and no SI but still tired.   Depression has recurred to a moderate level anxiety is still moderate. Stays busy and active.  Exercising still.  Lost weight with Wellbutrin.  Jobless not good. Anxious at times..  But kids around.  Function is ok.  Doesn't eat much protein.  Eats peanut butter, no eggs, no meat.   Sleeping with Belsomra and Xanax. 9-10 hours CO dry mouth and cavities.  Metal taste in her mouth.  Easily confused.   No meds were changed.  By early 2021 nefazodone was no longer manufactured and then became unavailable to her abruptly.  She called July 11, 2019 and the 2 best options were felt to be either switch to higher dose trazodone though it is probably not as effective as an antidepressant as is nefazodone or the other option Viibryd.  She chose to switch to trazodone because when she discontinued nefazodone her initial chief complaint was insomnia.  07/17/2019 appointment the following is reported:  Hit a wall with abrupt stopping of nefazodone.  Still feels wired. Up at night cleaning.  No wreckless impulsivity.  Head is fuzzy.   Started trazodone 100 mg is helping some. Last 2 nights forgot meds.  Sleep fair with trazodone.  No SE.   Felt wired off nefazodone Down to 1/2 Xanax for 5 days or longer.  Wants to stop it. Don't feel depressed. Lost appetite off nefazodone and losing weight. Recommendation was to increase trazodone gradually to 300 mg nightly if tolerated to help compensate for the absence of nefazodone and hopes of helping depression.  07/30/2019 phone call complaining of muscle weakness. 08/30/2019 phone call asking to increase Wellbutrin from 300 to 450 mg daily.  She had tried this in the past but it made her jittery but it was agreed she could retry it.  09/17/2019 appointment with the following noted: Overall doing well.  Increase trazodone to 300 mg HS and still can't sleep well at times. Feels better with higher dose Wellbutrin and it  didn't seem to worsen  Sleep. Tolerating changes except tremor. No taste for coffee any more.  No smoking in a year.  Wellbutrin helped.  [name] and kids good.   Better energy and less fatigue.  Less depression. Chronic sleep issues.  No anxiety.  Appetite OK.  Just don't eat like most people but does get protein.  Took a month off nefazodone to adjust.   Plan: Trial of Dayvigo in place of Belsomra at 5-10 mg HS. if it works better call back and let us know and we will change the prescription.  12/20/2019 appt with the following noted: Dayvigo not covered by insurance and made her a little dizzy in AM. Overall satisfied with Belsomra. CO metal taste in her mouth for a long time. CO forgetful  and loses train of thought.  Wonders if related to psych treatment She's reduced trzodone to 100 mg HS.  High doses made her dizzy. No depression.  Boys had Covid. She and H not vaccinated.  Long history of severe treatment resistant depression having failed multiple medications including imipramine,  paroxeine , nefazodone,   Viibryd, Prozac, venlafaxine, Emsam 9 mg, Wellbutrin, Pristiq, , mirtazapine nefazodone 600 sedation, duloxetine 90 mg, Trintellix, lithium 600, lamotrigine ? Response, Deplin, TMS, Latuda, lithium, olanzapine, quetiapine, Rexulti caused NMS, Vyvanse,  pramipexole, Abilify, ropinirole,  ProSom, Belsomra,  Vyvanse,  Provigil,  temazepam, Xanax, doxepin, Lunesta, gabapentin, Zofran, and she did respond to ECT.,   NAC  Review of Systems:  Review of Systems  Constitutional: Negative for appetite change, fatigue and unexpected weight change.  HENT: Negative for dental problem.   Gastrointestinal: Negative for abdominal pain.  Neurological: Negative for tremors and weakness.  Psychiatric/Behavioral: Positive for decreased concentration and depression. Negative for agitation, behavioral problems, confusion, dysphoric mood, hallucinations, self-injury, sleep disturbance and suicidal  ideas. The patient is nervous/anxious. The patient is not hyperactive.     Medications: I have reviewed the patient's current medications.  Current Outpatient Medications  Medication Sig Dispense Refill  . buPROPion (WELLBUTRIN XL) 150 MG 24 hr tablet TAKE 1 TABLET DAILY WITH A 300 MG TABLET TO EQUAL 450 MG 90 tablet 1  . buPROPion (WELLBUTRIN XL) 300 MG 24 hr tablet Take 1 tablet (300 mg total) by mouth daily. 90 tablet 1  . Cholecalciferol (VITAMIN D) 125 MCG (5000 UT) CAPS Take 5,000 Units by mouth daily.     . Cyanocobalamin (VITAMIN B 12 PO) Take by mouth.    . OLANZapine (ZYPREXA) 5 MG tablet Take 1 tablet (5 mg total) by mouth at bedtime. 90 tablet 1  . Suvorexant (BELSOMRA) 20 MG TABS Take 1 tablet by mouth at bedtime. 30 tablet 5  . traZODone (DESYREL) 100 MG tablet TAKE 3 TABLETS BY MOUTH EVERY DAY AT BEDTIME (Patient taking differently: Take 100 mg by mouth at bedtime. ) 270 tablet 1  . ALPRAZolam (XANAX) 1 MG tablet TAKE 1 TABLET BY MOUTH 3 TIMES A DAY AS NEEDED FOR ANXIETY 30 tablet 4  . lithium carbonate 150 MG capsule Take 3 capsules (450 mg total) by mouth every evening. 270 capsule 0   No current facility-administered medications for this visit.    Medication Side Effects: None  Allergies: No Known Allergies  Past Medical History:  Diagnosis Date  . Abdominal pain   . Anal itching   . Breast inflammation   . Breast nodule    left  . Chronic kidney disease   . Cystocele   . Decreased libido   . Depression   . Fibroadenoma   . Galactorrhea   . Mastodynia   . Pelvic pain in female   . Pelvic relaxation   . Rectocele   . Serotonin syndrome     Family History  Problem Relation Age of Onset  . Heart disease Mother        angioplasty  . Cancer Father        prostate  . Hypertension Father   . Depression Father   . Bipolar disorder Father   . Rheum arthritis Sister   . Rheum arthritis Brother   . Hypertension Maternal Grandmother   . Diabetes Maternal  Grandmother        borderline  . Hypertension Paternal Grandmother   . Arthritis Paternal Grandmother   . Stroke Paternal  Grandmother   . Rheum arthritis Paternal Grandfather   . Breast cancer Neg Hx    FHx: sister on olanzapine 5mg  and fluoxetine.  Social History   Socioeconomic History  . Marital status: Married    Spouse name: Not on file  . Number of children: 3  . Years of education: College  . Highest education level: Not on file  Occupational History  . Occupation: Surveyor, mining: Futures trader at [employer]  Tobacco Use  . Smoking status: Current Every Day Smoker    Packs/day: 0.50    Types: Cigarettes  . Smokeless tobacco: Never Used  Substance and Sexual Activity  . Alcohol use: Yes    Alcohol/week: 1.0 standard drink    Types: 1 Glasses of wine per week    Comment: one drink about once a month  . Drug use: No  . Sexual activity: Yes    Birth control/protection: Other-see comments    Comment: pt had hyst  Other Topics Concern  . Not on file  Social History Narrative  . Not on file   Social Determinants of Health   Financial Resource Strain:   . Difficulty of Paying Living Expenses: Not on file  Food Insecurity:   . Worried About Charity fundraiser in the Last Year: Not on file  . Ran Out of Food in the Last Year: Not on file  Transportation Needs:   . Lack of Transportation (Medical): Not on file  . Lack of Transportation (Non-Medical): Not on file  Physical Activity:   . Days of Exercise per Week: Not on file  . Minutes of Exercise per Session: Not on file  Stress:   . Feeling of Stress : Not on file  Social Connections:   . Frequency of Communication with Friends and Family: Not on file  . Frequency of Social Gatherings with Friends and Family: Not on file  . Attends Religious Services: Not on file  . Active Member of Clubs or Organizations: Not on file  . Attends Archivist Meetings: Not on file  . Marital Status: Not on  file  Intimate Partner Violence:   . Fear of Current or Ex-Partner: Not on file  . Emotionally Abused: Not on file  . Physically Abused: Not on file  . Sexually Abused: Not on file    Past Medical History, Surgical history, Social history, and Family history were reviewed and updated as appropriate.   Please see review of systems for further details on the patient's review from today.   Objective:   Physical Exam:  LMP 05/20/2010   Physical Exam Constitutional:      General: She is not in acute distress.    Appearance: She is well-developed.  Musculoskeletal:        General: No deformity.  Neurological:     Mental Status: She is alert and oriented to person, place, and time.     Cranial Nerves: No dysarthria.     Coordination: Coordination normal.  Psychiatric:        Attention and Perception: Attention and perception normal. She does not perceive auditory or visual hallucinations.        Mood and Affect: Mood is anxious. Mood is not depressed. Affect is not labile, blunt, angry or inappropriate.        Speech: Speech normal.        Behavior: Behavior normal. Behavior is cooperative.        Thought Content: Thought content normal. Thought  content is not paranoid or delusional. Thought content does not include homicidal or suicidal ideation. Thought content does not include homicidal or suicidal plan.        Cognition and Memory: Cognition and memory normal.        Judgment: Judgment normal.     Comments: Insight intact SI resolved. Dep resolved at present     Lab Review:     Component Value Date/Time   NA 140 10/10/2015 1115   K 3.6 10/10/2015 1115   CL 102 10/10/2015 1115   CO2 31 10/10/2015 1115   GLUCOSE 102 (H) 10/10/2015 1115   BUN 11 10/10/2015 1115   CREATININE 0.84 10/10/2015 1115   CALCIUM 9.4 10/10/2015 1115   PROT 6.6 10/10/2015 1115   ALBUMIN 4.5 10/10/2015 1115   AST 26 10/10/2015 1115   ALT 21 10/10/2015 1115   ALKPHOS 60 10/10/2015 1115    BILITOT 0.9 10/10/2015 1115   GFRNONAA >60 10/10/2015 1115   GFRAA >60 10/10/2015 1115       Component Value Date/Time   WBC 7.1 11/16/2017 0950   RBC 4.51 11/16/2017 0950   HGB 15.0 11/16/2017 0950   HCT 44.8 11/16/2017 0950   PLT 209.0 11/16/2017 0950   MCV 99.3 11/16/2017 0950   MCH 33.5 10/10/2015 1115   MCHC 33.5 11/16/2017 0950   RDW 13.0 11/16/2017 0950   LYMPHSABS 1.9 11/16/2017 0950   MONOABS 0.5 11/16/2017 0950   EOSABS 0.2 11/16/2017 0950   BASOSABS 0.0 11/16/2017 0950    Lithium Lvl  Date Value Ref Range Status  04/11/2019 0.7 0.6 - 1.2 mmol/L Final    Comment:                                     Detection Limit = 0.1                           <0.1 indicates None Detected      No results found for: PHENYTOIN, PHENOBARB, VALPROATE, CBMZ   Lithium level received April 27, 2018 was 0.7.  This is probably adequate to help reduce the risk of relapse of depression post [place].  Therefore no med changes.    Vitamin D level April 27, 2018 on supplement was 60 and adequate. .res Assessment: Plan:    [name] was seen today for follow-up, depression, sleeping problem and anxiety.  Diagnoses and all orders for this visit:  Recurrent major depression resistant to treatment ([place]) -     OLANZapine (ZYPREXA) 5 MG tablet; Take 1 tablet (5 mg total) by mouth at bedtime. -     lithium carbonate 150 MG capsule; Take 3 capsules (450 mg total) by mouth every evening. -     buPROPion (WELLBUTRIN XL) 300 MG 24 hr tablet; Take 1 tablet (300 mg total) by mouth daily.  Insomnia due to mental condition -     Suvorexant (BELSOMRA) 20 MG TABS; Take 1 tablet by mouth at bedtime. -     ALPRAZolam (XANAX) 1 MG tablet; TAKE 1 TABLET BY MOUTH 3 TIMES A DAY AS NEEDED FOR ANXIETY  Generalized anxiety disorder -     OLANZapine (ZYPREXA) 5 MG tablet; Take 1 tablet (5 mg total) by mouth at bedtime.  Low vitamin D level    Greater than 50% of 25 min non face to face time with patient  was spent  on counseling and coordination of care. 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  Reviewed multiple failed medications noted above.  She failed the attempt to wean Zyprexa because of worsening anxiety.  Anxiety is helped with 2.5 mg Zyprexa daily.  Continue trazodone 300 mg nightly. It may be having some beneficial effect for depression at the higher dose. The increase in Wellbutrin back to 450 mg daily has led to better control of depression as well.  She is jittery but tolerating it and find it's benefits are worth the shakiness. Overall depression is better controlled that it has been in some time.  She still has some sleep problems despite several meds to help with sleep.  Defer per her request NAC 600 mg or more daily.  She feels her protein intake is adequate and her weight is stable.  continue Belsomra at 10 mg HS.   Energy is better and no depression.  B12 seemed to help.   Option Aricept DT memory concerns.  She doesn't want more meds.  Her SI is gone after increasing lithium to 450 mg for SI and depression.  Depression resolved.  Discussed potential metabolic side effects associated with atypical antipsychotics, as well as potential risk for movement side effects. Advised pt to contact office if movement side effects occur.   She felt 50% better with last [place] but 100% better with first round [place].  Counseled patient regarding potential benefits, risks, and side effects of lithium to include potential risk of lithium affecting thyroid and renal function.  Discussed need for periodic lab monitoring to determine drug level and to assess for potential adverse effects.  Counseled patient regarding signs and symptoms of lithium toxicity and advised that they notify office immediately or seek urgent medical attention if experiencing these signs and symptoms.  Patient advised to contact office with any questions or concerns.  Disc lithium and toxicity. Lithium level good from April 11, 2019  at 0.7.   See PCP about balance px.  May need neuro consult.  Vitamin D 5000 units daily  FU 3 mos  Lynder Parents, MD, DFAPA    Please see After Visit Summary for patient specific instructions.  No future appointments.  No orders of the defined types were placed in this encounter.    Lynder Parents, MD, DFAPA   -------------------------------

## 2019-12-24 ENCOUNTER — Telehealth: Payer: Self-pay | Admitting: Psychiatry

## 2019-12-24 NOTE — Telephone Encounter (Signed)
Contacted [facility] and they said patient lost her olanzapine and she contacted them last week and paid out of pocket for a few. They said insurance will cover her Rx on 12/29/2019, but until then she will have to pay out of pocket. Left her a detailed message concerning this and if there is anything else she is needing to call back.

## 2019-12-24 NOTE — Telephone Encounter (Signed)
Pt having trouble with her Olanzapine at [facility]. Pt would like to talk to [place]. Please call.

## 2019-12-24 NOTE — Telephone Encounter (Signed)
Left pt another message

## 2019-12-25 NOTE — Telephone Encounter (Signed)
Spoke with patient and she is good will get refill on 12/29/19

## 2020-01-09 ENCOUNTER — Telehealth: Payer: Self-pay | Admitting: Psychiatry

## 2020-01-09 NOTE — Telephone Encounter (Signed)
Can send in RX alprazolam to fill the time until she can have it refilled.  She'll have to pay cash for it but it's cheap.  She has tried lots of other meds.  I don't want to change meds.

## 2020-01-09 NOTE — Telephone Encounter (Signed)
Please review

## 2020-01-09 NOTE — Telephone Encounter (Signed)
Pt called to report [facility] advised pt she had filled Rx for Alprazolam 9/18. She can not find them anywhere. She may have trashed them.   Asking if CC can give her something else that would work the same until next month when she can get Alprazolam. Return call @ [phone]

## 2020-01-10 NOTE — Telephone Encounter (Signed)
Left detailed message with information and to call back if she wants a new Rx sent in

## 2020-01-16 ENCOUNTER — Other Ambulatory Visit: Payer: Self-pay | Admitting: Psychiatry

## 2020-01-16 DIAGNOSIS — F339 Major depressive disorder, recurrent, unspecified: Secondary | ICD-10-CM

## 2020-01-16 NOTE — Telephone Encounter (Signed)
Please review

## 2020-01-31 ENCOUNTER — Other Ambulatory Visit: Payer: Self-pay | Admitting: Obstetrics and Gynecology

## 2020-01-31 DIAGNOSIS — Z1231 Encounter for screening mammogram for malignant neoplasm of breast: Secondary | ICD-10-CM

## 2020-02-11 ENCOUNTER — Encounter: Payer: Self-pay | Admitting: Podiatry

## 2020-02-11 ENCOUNTER — Other Ambulatory Visit: Payer: Self-pay

## 2020-02-11 ENCOUNTER — Ambulatory Visit: Payer: BC Managed Care – PPO | Admitting: Podiatry

## 2020-02-11 DIAGNOSIS — G5781 Other specified mononeuropathies of right lower limb: Secondary | ICD-10-CM

## 2020-02-11 DIAGNOSIS — G5761 Lesion of plantar nerve, right lower limb: Secondary | ICD-10-CM

## 2020-02-11 NOTE — Progress Notes (Signed)
  Subjective:  Patient ID: [name], female    DOB: October 18, 1965,  MRN: [id number]  Chief Complaint  Patient presents with  . Foot Pain    patient presents today for neuroma flare up right foot again x 1 month and progressivly getting worse.      54 y.o. female presents with the above complaint. History confirmed with patient.  The neuroma has become painful again.  She previously saw Dr. Milinda Pointer a few years ago and had injections which were helpful.  She is getting tired of having this and is interested in surgical intervention.  She states that it is worse with closed shoes and is better in the summer with open sandals and flip-flops.  Objective:  Physical Exam: warm, good capillary refill, no trophic changes or ulcerative lesions, normal DP and PT pulses and normal sensory exam.   Right Foot: Pain on palpation to right third interspace with a positive Mulder's click  Assessment:   1. 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  Discussed etiology and treatment options in detail with the patient for pedal neuromas.  I discussed with her that beyond injection therapy and offloading there are 2 surgical approaches: Decompression with release of the deep transverse intermetatarsal ligament or complete neurectomy with bearing into bone or muscle.  Discussed the risk and benefits of both these and that decompression may offer a faster recovery they should be able to continue working (she works in Scientist, research (medical)) but may not be completely curative.  I recommend we obtain an MRI to evaluate for potential surgical approaches and we will discuss further.  Today she also requested injection and this was completed as noted below  After sterile prep with povidone-iodine solution and alcohol, the third interspace of the right foot was injected with 0.5cc 2% xylocaine plain, 0.5cc 0.5% marcaine plain, 5mg  triamcinolone acetonide,  and 2mg  dexamethasone was injected. The patient tolerated the procedure well without complication.   Return in about 1 month (around 03/12/2020).

## 2020-02-25 ENCOUNTER — Other Ambulatory Visit: Payer: BC Managed Care – PPO

## 2020-03-01 ENCOUNTER — Encounter (HOSPITAL_COMMUNITY): Payer: Self-pay | Admitting: Emergency Medicine

## 2020-03-01 ENCOUNTER — Emergency Department (HOSPITAL_COMMUNITY): Payer: BC Managed Care – PPO

## 2020-03-01 ENCOUNTER — Other Ambulatory Visit: Payer: Self-pay

## 2020-03-01 ENCOUNTER — Emergency Department (HOSPITAL_COMMUNITY)
Admission: EM | Admit: 2020-03-01 | Discharge: 2020-03-01 | Disposition: A | Discharge status: Home / Self Care | Payer: BC Managed Care – PPO | Attending: Emergency Medicine | Admitting: Emergency Medicine

## 2020-03-01 DIAGNOSIS — F17210 Nicotine dependence, cigarettes, uncomplicated: Secondary | ICD-10-CM | POA: Insufficient documentation

## 2020-03-01 DIAGNOSIS — N189 Chronic kidney disease, unspecified: Secondary | ICD-10-CM | POA: Insufficient documentation

## 2020-03-01 DIAGNOSIS — T50901A Poisoning by unspecified drugs, medicaments and biological substances, accidental (unintentional), initial encounter: Secondary | ICD-10-CM | POA: Diagnosis not present

## 2020-03-01 LAB — COMPREHENSIVE METABOLIC PANEL
ALT: 22 U/L (ref 0–44)
AST: 28 U/L (ref 15–41)
Albumin: 4.5 g/dL (ref 3.5–5.0)
Alkaline Phosphatase: 68 U/L (ref 38–126)
Anion gap: 13 (ref 5–15)
BUN: 9 mg/dL (ref 6–20)
CO2: 25 mmol/L (ref 22–32)
Calcium: 10 mg/dL (ref 8.9–10.3)
Chloride: 102 mmol/L (ref 98–111)
Creatinine, Ser: 1.17 mg/dL — ABNORMAL HIGH (ref 0.44–1.00)
GFR, Estimated: 55 mL/min — ABNORMAL LOW (ref >60)
Glucose, Bld: 92 mg/dL (ref 70–99)
Potassium: 3.8 mmol/L (ref 3.5–5.1)
Sodium: 140 mmol/L (ref 135–145)
Total Bilirubin: 0.8 mg/dL (ref 0.3–1.2)
Total Protein: 6.8 g/dL (ref 6.5–8.1)

## 2020-03-01 LAB — CBC WITH DIFFERENTIAL/PLATELET
Abs Immature Granulocytes: 0.03 10*3/uL (ref 0.00–0.07)
Basophils Absolute: 0.1 10*3/uL (ref 0.0–0.1)
Basophils Relative: 1 %
Eosinophils Absolute: 0.1 10*3/uL (ref 0.0–0.5)
Eosinophils Relative: 2 %
HCT: 50.9 % — ABNORMAL HIGH (ref 36.0–46.0)
Hemoglobin: 16.5 g/dL — ABNORMAL HIGH (ref 12.0–15.0)
Immature Granulocytes: 0 %
Lymphocytes Relative: 17 %
Lymphs Abs: 1.2 10*3/uL (ref 0.7–4.0)
MCH: 32.2 pg (ref 26.0–34.0)
MCHC: 32.4 g/dL (ref 30.0–36.0)
MCV: 99.4 fL (ref 80.0–100.0)
Monocytes Absolute: 0.4 10*3/uL (ref 0.1–1.0)
Monocytes Relative: 6 %
Neutro Abs: 5.4 10*3/uL (ref 1.7–7.7)
Neutrophils Relative %: 74 %
Platelets: 211 10*3/uL (ref 150–400)
RBC: 5.12 MIL/uL — ABNORMAL HIGH (ref 3.87–5.11)
RDW: 11.9 % (ref 11.5–15.5)
WBC: 7.3 10*3/uL (ref 4.0–10.5)
nRBC: 0 % (ref 0.0–0.2)

## 2020-03-01 LAB — URINALYSIS, ROUTINE W REFLEX MICROSCOPIC
Bacteria, UA: NONE SEEN
Bilirubin Urine: NEGATIVE
Glucose, UA: NEGATIVE mg/dL
Hgb urine dipstick: NEGATIVE
Ketones, ur: NEGATIVE mg/dL
Nitrite: NEGATIVE
Protein, ur: NEGATIVE mg/dL
Specific Gravity, Urine: 1.003 — ABNORMAL LOW (ref 1.005–1.030)
pH: 7 (ref 5.0–8.0)

## 2020-03-01 LAB — LITHIUM LEVEL: Lithium Lvl: 0.41 mmol/L — ABNORMAL LOW (ref 0.60–1.20)

## 2020-03-01 LAB — ACETAMINOPHEN LEVEL: Acetaminophen (Tylenol), Serum: <10 ug/mL — ABNORMAL LOW (ref 10–30)

## 2020-03-01 LAB — RAPID URINE DRUG SCREEN, HOSP PERFORMED
Amphetamines: NOT DETECTED
Barbiturates: NOT DETECTED
Benzodiazepines: POSITIVE — AB
Cocaine: NOT DETECTED
Opiates: NOT DETECTED
Tetrahydrocannabinol: NOT DETECTED

## 2020-03-01 LAB — ETHANOL: Alcohol, Ethyl (B): <10 mg/dL (ref <10)

## 2020-03-01 LAB — SALICYLATE LEVEL: Salicylate Lvl: <7 mg/dL — ABNORMAL LOW (ref 7.0–30.0)

## 2020-03-01 MED ORDER — ACETAMINOPHEN 325 MG PO TABS
650.0000 mg | ORAL_TABLET | Freq: Once | ORAL | Status: AC
  Administered 2020-03-01: 650 mg via ORAL
  Filled 2020-03-01: qty 2

## 2020-03-01 NOTE — ED Notes (Signed)
Pt ambulated to BR with no assistance, pt anxious to be discharged.

## 2020-03-01 NOTE — ED Notes (Signed)
Dr Sheldon at bedside  

## 2020-03-01 NOTE — ED Notes (Signed)
Pt verbalized understanding of d/c instructions follow up and medications. Pt A & O, denies SI/HI, pt transported to vehicle via WC.

## 2020-03-01 NOTE — ED Triage Notes (Signed)
Pt states she accidentally took 2 doses of Wellbutrin today.  States she normally takes 450mg  and she forgot she had already took it and took it again.  Unsure how far apart she took them.  Reports shaking and double vision that she states has improved.  Denies SI.

## 2020-03-01 NOTE — ED Notes (Addendum)
Pt went to CT

## 2020-03-01 NOTE — ED Provider Notes (Signed)
[facility] EMERGENCY DEPARTMENT Provider Note  CSN: [id number] Arrival date & time: 03/01/20 1558    History Chief Complaint  Patient presents with  . Overdose    HPI  [name] is a 54 y.o. female with history of psychiatric illness, taking Wellbutrin and lithium among other medications she does not remember. She is vague and evasive with history but she apparently was at work around [place] today when she began to feel dizzy, lightheaded and off balance. She told her boss and then a coworker took her home. Her daughter at bedside states the patient was not acting right, having trouble walking and seemed confused and so she brought her to the ED for evaluation. The patient is attributing her symptoms to accidentally taking an extra dose of Wellbutrin before going to work because she couldn't remember if she had taken her usual dose. She did not take any other extra medications and no intentional overdose. She denies any EtOH or illicit drug use. She repeatedly says she 'feels fine' and that she wants to go home, although daughter at bedside confirms she is not at her baseline.    Past Medical History:  Diagnosis Date  . Abdominal pain   . Anal itching   . Breast inflammation   . Breast nodule    left  . Chronic kidney disease   . Cystocele   . Decreased libido   . Depression   . Fibroadenoma   . Galactorrhea   . Mastodynia   . Pelvic pain in female   . Pelvic relaxation   . Rectocele   . Serotonin syndrome     Past Surgical History:  Procedure Laterality Date  . ABDOMINAL HYSTERECTOMY     partial  . BREAST BIOPSY    . HERNIA REPAIR    . INCONTINENCE SURGERY    . OVARIAN CYST REMOVAL     left    Family History  Problem Relation Age of Onset  . Heart disease Mother        angioplasty  . Cancer Father        prostate  . Hypertension Father   . Depression Father   . Bipolar disorder Father   . Rheum arthritis Sister   . Rheum arthritis Brother   .  Hypertension Maternal Grandmother   . Diabetes Maternal Grandmother        borderline  . Hypertension Paternal Grandmother   . Arthritis Paternal Grandmother   . Stroke Paternal Grandmother   . Rheum arthritis Paternal Grandfather   . Breast cancer Neg Hx     Social History   Tobacco Use  . Smoking status: Current Every Day Smoker    Packs/day: 0.50    Types: Cigarettes  . Smokeless tobacco: Never Used  Substance Use Topics  . Alcohol use: Yes    Alcohol/week: 1.0 standard drink    Types: 1 Glasses of wine per week    Comment: one drink about once a month  . Drug use: No     Home Medications Prior to Admission medications   Medication Sig Start Date End Date Taking? Authorizing Provider  buPROPion (WELLBUTRIN XL) 150 MG 24 hr tablet TAKE 1 TABLET BY MOUTH EVERY DAY WITH 300MG  TABLET TO EQUAL 450MG  Patient taking differently: Take 150 mg by mouth in the morning.  01/16/20  Yes Cottle, Billey Co., MD  buPROPion (WELLBUTRIN XL) 300 MG 24 hr tablet Take 1 tablet (300 mg total) by mouth daily. Patient taking differently:  Take 300 mg by mouth in the morning.  12/20/19  Yes Cottle, Billey Co., MD  Cholecalciferol (VITAMIN D3) 50 MCG (2000 UT) TABS Take 2,000 Units by mouth daily with breakfast.   Yes [provider]  Cyanocobalamin (VITAMIN B 12 PO) Take 1 tablet by mouth daily with breakfast.    Yes [provider]  lithium carbonate 150 MG capsule Take 3 capsules (450 mg total) by mouth every evening. 12/20/19  Yes Cottle, Billey Co., MD  OLANZapine (ZYPREXA) 5 MG tablet Take 1 tablet (5 mg total) by mouth at bedtime. Patient taking differently: Take 2.5 mg by mouth at bedtime.  12/20/19  Yes Cottle, Billey Co., MD  omeprazole (PRILOSEC OTC) 20 MG tablet Take 20 mg by mouth at bedtime.   Yes [provider]  ALPRAZolam (XANAX) 1 MG tablet TAKE 1 TABLET BY MOUTH 3 TIMES A DAY AS NEEDED FOR ANXIETY Patient not taking: Reported on 03/01/2020 12/20/19    Cottle, Billey Co., MD  Suvorexant (BELSOMRA) 20 MG TABS Take 1 tablet by mouth at bedtime. Patient not taking: Reported on 03/01/2020 12/20/19   Purnell Shoemaker., MD  traZODone (DESYREL) 100 MG tablet TAKE 3 TABLETS BY MOUTH EVERY DAY AT BEDTIME Patient not taking: Reported on 03/01/2020 09/13/19   Cottle, Billey Co., MD     Allergies    Patient has no known allergies.   Review of Systems   Review of Systems Unable to fully assess due to mental status.    Physical Exam BP (!) 153/93   Pulse 82   Temp 97.8 F (36.6 C) (Tympanic)   Resp 19   LMP 05/20/2010   SpO2 99%   Physical Exam Vitals and nursing note reviewed.  Constitutional:      Appearance: Normal appearance.  HENT:     Head: Normocephalic and atraumatic.     Nose: Nose normal.     Mouth/Throat:     Mouth: Mucous membranes are moist.  Eyes:     Extraocular Movements: Extraocular movements intact.     Conjunctiva/sclera: Conjunctivae normal.  Cardiovascular:     Rate and Rhythm: Normal rate.  Pulmonary:     Effort: Pulmonary effort is normal.     Breath sounds: Normal breath sounds.  Abdominal:     General: Abdomen is flat.     Palpations: Abdomen is soft.     Tenderness: There is no abdominal tenderness.  Musculoskeletal:        General: No swelling. Normal range of motion.     Cervical back: Neck supple.  Skin:    General: Skin is warm and dry.  Neurological:     Mental Status: She is alert and oriented to person, place, and time.     Cranial Nerves: No cranial nerve deficit.     Sensory: No sensory deficit.     Motor: No weakness.     Coordination: Coordination abnormal (L pronator drive and abnormal L heel-to-shin, finger to nose is normal bilaterally).  Psychiatric:     Comments: Evasive, repetitive, gives inconsistent answers to same questions, tearful      ED Results / Procedures / Treatments   Labs (all labs ordered are listed, but only abnormal results are displayed) Labs Reviewed   COMPREHENSIVE METABOLIC PANEL - Abnormal; Notable for the following components:      Result Value   Creatinine, Ser 1.17 (*)    GFR, Estimated 55 (*)    All other components within  normal limits  CBC WITH DIFFERENTIAL/PLATELET - Abnormal; Notable for the following components:   RBC 5.12 (*)    Hemoglobin 16.5 (*)    HCT 50.9 (*)    All other components within normal limits  URINALYSIS, ROUTINE W REFLEX MICROSCOPIC - Abnormal; Notable for the following components:   Color, Urine STRAW (*)    Specific Gravity, Urine 1.003 (*)    Leukocytes,Ua SMALL (*)    All other components within normal limits  RAPID URINE DRUG SCREEN, HOSP PERFORMED - Abnormal; Notable for the following components:   Benzodiazepines POSITIVE (*)    All other components within normal limits  SALICYLATE LEVEL - Abnormal; Notable for the following components:   Salicylate Lvl <8.9 (*)    All other components within normal limits  ACETAMINOPHEN LEVEL - Abnormal; Notable for the following components:   Acetaminophen (Tylenol), Serum <10 (*)    All other components within normal limits  LITHIUM LEVEL - Abnormal; Notable for the following components:   Lithium Lvl 0.41 (*)    All other components within normal limits  ETHANOL    EKG EKG Interpretation  Date/Time:  Saturday March 01 2020 16:02:08 EST Ventricular Rate:  94 PR Interval:  132 QRS Duration: 82 QT Interval:  370 QTC Calculation: 462 R Axis:   77 Text Interpretation: Normal sinus rhythm Right atrial enlargement Cannot rule out Anterior infarct , age undetermined Abnormal ECG No significant change since last tracing Confirmed by Calvert Cantor [phone]) on 03/01/2020 4:12:44 PM   Radiology CT Head Wo Contrast  Result Date: 03/01/2020 CLINICAL DATA:  Double vision EXAM: CT HEAD WITHOUT CONTRAST TECHNIQUE: Contiguous axial images were obtained from the base of the skull through the vertex without intravenous contrast. COMPARISON:  01/02/2006  FINDINGS: Brain: No evidence of acute infarction, hemorrhage, hydrocephalus, extra-axial collection or mass lesion/mass effect. Vascular: No hyperdense vessel or unexpected calcification. Skull: Normal. Negative for fracture or focal lesion. Sinuses/Orbits: No acute finding. Other: None. IMPRESSION: No acute intracranial findings. Electronically Signed   By: Davina Poke D.O.   On: 03/01/2020 17:21    Procedures Procedures  Medications Ordered in the ED Medications  acetaminophen (TYLENOL) tablet 650 mg (650 mg Oral Given 03/01/20 1811)     MDM Rules/Calculators/A&P MDM Patient initially insistent that she is fine, but clearly not at baseline per daughter. Patient is willing to stay in the ED for full evaluation including labs, CT and cardiac monitoring. She is hemodynamically stable.  ED Course  I have reviewed the triage vital signs and the nursing notes.  Pertinent labs & imaging results that were available during my care of the patient were reviewed by me and considered in my medical decision making (see chart for details).  Clinical Course as of Mar 01 2008  Sat Mar 01, 2020  1741 CT head neg. UDS neg except for Benzo as expected for listed medications. UA neg.    [CS]  1818 CMP and Lithium are unremarkable.    [CS]  0388 Spoke with Poison Control who report that the patient's symptoms could be due to her Wellbutrin dose. They recommend ED observation up to 20 hours post ingestion if she is still symptomatic. If she returns to baseline she should be safe for discharge.    [CS]  1921 Patient and husband at bedside report she has returned to baseline. She is more interactive, calm and cooperative. She will attempt to walk and if no longer feeling off balance, will plan discharge home.    [  CS]    Clinical Course User Index [CS] Truddie Hidden, MD    Final Clinical Impression(s) / ED Diagnoses Final diagnoses:  Accidental overdose, initial encounter    Rx / DC  Orders ED Discharge Orders    None       Truddie Hidden, MD 03/01/20 2009

## 2020-03-03 ENCOUNTER — Telehealth: Payer: Self-pay | Admitting: Psychiatry

## 2020-03-03 NOTE — Telephone Encounter (Signed)
Return telephone call: As noted patient excellently overtook Wellbutrin and had 900 mg on Saturday and felt very agitated and hallucinated and was dizzy.  Symptoms quickly resolved.  She did not have a seizure.  She has reduced the dose to 300 mg a day because she is somewhat anxious pending a new job.  She does not want any other med changes.  We discussed the lithium and its purpose and the reason why her blood level was low which is to be expected for the purpose we are using it.    No med change indicated.

## 2020-03-03 NOTE — Telephone Encounter (Signed)
Please review

## 2020-03-03 NOTE — Telephone Encounter (Signed)
[name] called to report that she had to go to the ER on Saturday and they told her to call you and let you know what happen.  She took her Wellbutrin in the morning as usual. But a bit later she looked at her bottle and didn't remember taking it so she took it again. So she had 900mg  of Wellbutrin in her system.  She got very angry and belligerent and started hallucinating.  She so went to the ER.  They of course did a full work up of labs.  They did not give her any medication and waiting for the Wellbutrin to process through her system.   She did say that the labs showed her lithium was low which she thought was strange since she takes lithium.  You should be able to see the notes in the system.  She was at [facility].

## 2020-03-07 ENCOUNTER — Ambulatory Visit: Payer: BC Managed Care – PPO

## 2020-03-13 ENCOUNTER — Other Ambulatory Visit: Payer: Self-pay | Admitting: Psychiatry

## 2020-03-13 DIAGNOSIS — F339 Major depressive disorder, recurrent, unspecified: Secondary | ICD-10-CM

## 2020-03-17 ENCOUNTER — Ambulatory Visit (INDEPENDENT_AMBULATORY_CARE_PROVIDER_SITE_OTHER): Payer: BC Managed Care – PPO | Admitting: Psychiatry

## 2020-03-17 ENCOUNTER — Other Ambulatory Visit: Payer: Self-pay

## 2020-03-17 ENCOUNTER — Encounter: Payer: Self-pay | Admitting: Psychiatry

## 2020-03-17 DIAGNOSIS — R7989 Other specified abnormal findings of blood chemistry: Secondary | ICD-10-CM | POA: Diagnosis not present

## 2020-03-17 DIAGNOSIS — F5105 Insomnia due to other mental disorder: Secondary | ICD-10-CM

## 2020-03-17 DIAGNOSIS — F339 Major depressive disorder, recurrent, unspecified: Secondary | ICD-10-CM | POA: Diagnosis not present

## 2020-03-17 DIAGNOSIS — F411 Generalized anxiety disorder: Secondary | ICD-10-CM

## 2020-03-17 NOTE — Progress Notes (Signed)
TATIA [name] [id number] 1966/02/15 54 y.o.     Subjective:   Patient ID:  [name] is a 53 y.o. (DOB [birth year]) female.  Chief Complaint:  Chief Complaint  Patient presents with  . Follow-up  . Depression  . Anxiety    Depression        Associated symptoms include decreased concentration.  Associated symptoms include no fatigue, no appetite change and no suicidal ideas. Medication Refill Associated symptoms include abdominal pain. Pertinent negatives include no fatigue or weakness.    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  At visit March 2020.  She had completed [place] early December.  "It worked"  and had resolution of her depression!  She started Wellbutrin XL 300 mg daily for relapse prevention.  She continued olanzapine 2.5 mg every afternoon because her anxiety relapsed when she would try to completely discontinue it.  Stopped lithium DT SE and felt worse and restarted 300 daily.   In may 2020 pt taking lithium 600 mg and CO balance problems.  She stopped lithium and lithium level at 62 hours later was 0.4, suggesting possible prior toxicity so the lithium was reduced to 300 mg.    in June.  Because Wellbutrin had affected her sleep we switched it to Wellbutrin SR 200 mg every morning and encouraged her to reduce caffeine.  She had felt well but Wellbutrin XL 300 mg made her edgy and caused some insomnia.  She was also encouraged to take NAC as a supplement daily.  She called back September 14 stating that she wanted to switch back to Wellbutrin XL 300 mg because she felt that SR 200 mg was not working sufficiently.  She was cautioned about it contributing to insomnia because there was a concern and had done that in the past.  At visit January 15, 2019 the following was noted: Started a new job temporarily which didn't work.  Was so nervous going and needed 1/2 Xanax  to go to work.  Couldn't meed the schedule demands Still has balance issues and memory. She reduced the lithium from 300 to 150 mg daily bc she doesn't like taking a lot of meds and husband doesn't either.  She made the change on her own. Restarted [place] July August 2020 and felt much better.  Seeing [name] at [facility].  .  Mostly resolved with this tx and finished end of August.  Was good for a month and then the depression returned.  Kind of avoiding people, not severe.  But had periods of "horrific suicidal thoughts" though comments to safety.   Siister died January 07, 2023 COPD and was close to her.   visit Jan 15, 2019 and increased lithium to 450, suggested B12 for energy.  seen March 27, 2019.  The following was noted: Anxiety is helped with 2.5 mg Zyprexa daily. Option increase nefazodone again.  Some compliance issues. She doesn't like taking so much meds. incr Wellbutrin XL 450 mg AM if tolerated.  Disc SE including more anxiety, insomnia. Trial B12 to see if energy is better in the morning. Her SI is gone after increasing lithium to 450 mg for SI and depression.  Continue this She continues sleep meds including Belsomra 20 mg nightly Requested lithium level  Last seen May 08, 2019.  The following was noted Increased Wellbutrin 450 too much jitteriness.  So reduced to 300 mg. Terrible STM. Loses track of thoughts in the  middle of sentences.  Poor penmanship comes and goes. Sometimes when walks right foot sticks to the ground with one fall.   So tired.   SI resolved overnight with increased lithium.  Still having problems with balance.  Some word-finding issues and anxiety.  Energy is better.  Sleep is good at this time. Some benefit with incr Wellbutrin.  Exhausted.  At times has to lay down.  Hard to get up and go work out in the morning.  Lower nefazadone 200 mg did not help her energy and her sleep was worse.     But still tired.  During [place] no depression and no SI but still  tired.   Depression has recurred to a moderate level anxiety is still moderate. Stays busy and active.  Exercising still.  Lost weight with Wellbutrin.  Jobless not good. Anxious at times..  But kids around.  Function is ok.  Doesn't eat much protein.  Eats peanut butter, no eggs, no meat.   Sleeping with Belsomra and Xanax. 9-10 hours CO dry mouth and cavities.  Metal taste in her mouth.  Easily confused.   No meds were changed.  By early 2021 nefazodone was no longer manufactured and then became unavailable to her abruptly.  She called July 11, 2019 and the 2 best options were felt to be either switch to higher dose trazodone though it is probably not as effective as an antidepressant as is nefazodone or the other option Viibryd.  She chose to switch to trazodone because when she discontinued nefazodone her initial chief complaint was insomnia.  07/17/2019 appointment the following is reported:  Hit a wall with abrupt stopping of nefazodone.  Still feels wired. Up at night cleaning.  No wreckless impulsivity.  Head is fuzzy.   Started trazodone 100 mg is helping some. Last 2 nights forgot meds.  Sleep fair with trazodone.  No SE.   Felt wired off nefazodone Down to 1/2 Xanax for 5 days or longer.  Wants to stop it. Don't feel depressed. Lost appetite off nefazodone and losing weight. Recommendation was to increase trazodone gradually to 300 mg nightly if tolerated to help compensate for the absence of nefazodone and hopes of helping depression.  07/30/2019 phone call complaining of muscle weakness. 08/30/2019 phone call asking to increase Wellbutrin from 300 to 450 mg daily.  She had tried this in the past but it made her jittery but it was agreed she could retry it.  09/17/2019 appointment with the following noted: Overall doing well.  Increase trazodone to 300 mg HS and still can't sleep well at times. Feels better with higher dose Wellbutrin and it didn't seem to worsen  Sleep. Tolerating  changes except tremor. No taste for coffee any more.  No smoking in a year.  Wellbutrin helped.  Pilar Plate and kids good.   Better energy and less fatigue.  Less depression. Chronic sleep issues.  No anxiety.  Appetite OK.  Just don't eat like most people but does get protein.  Took a month off nefazodone to adjust.   Plan: Trial of Dayvigo in place of Belsomra at 5-10 mg HS. if it works better call back and let us know and we will change the prescription.  12/20/2019 appt with the following noted: Dayvigo not covered by insurance and made her a little dizzy in AM. Overall satisfied with Belsomra. CO metal taste in her mouth for a long time. CO forgetful and loses train of thought.  Wonders if related to psych  treatment She's reduced trzodone to 100 mg HS.  High doses made her dizzy. No depression.  Boys had Covid. She and H not vaccinated. Plan: No med changes  03/17/2020 appointment with the following noted: Disc recent accidental OD Wellbutrin with doubling dose.  It was horrible on 900 mg Wellbutrin.    Now only taking 300 mg Wellbutrin but H worries.  Better at 450 mg but is relatively OK.  Not sig depressed.  Satisfied for now. 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 Weaned off trazodone and is OK.  Sleep is inconsistent and then tired if that happens.  Asked questions about psychadelics in news for info for mental health problems.  Some anxiety lately.   Long history of severe treatment resistant depression having failed multiple medications including imipramine,  paroxeine , nefazodone,   Viibryd, Prozac, venlafaxine, Emsam 9 mg, Wellbutrin, Pristiq, , mirtazapine nefazodone 600 sedation, duloxetine 90 mg, Trintellix, lithium 600, lamotrigine ? Response, Deplin, TMS, Latuda, lithium, olanzapine, quetiapine, Rexulti caused NMS, Vyvanse,  pramipexole, Abilify, ropinirole,  ProSom, Belsomra,  Vyvanse,  Provigil,  temazepam, Xanax, doxepin, Lunesta, gabapentin, Zofran, and she did respond to  ECT.,   NAC  Review of Systems:  Review of Systems  Constitutional: Negative for appetite change, fatigue and unexpected weight change.  HENT: Negative for dental problem.   Gastrointestinal: Positive for abdominal pain.  Neurological: Negative for tremors and weakness.  Psychiatric/Behavioral: Positive for decreased concentration and depression. Negative for agitation, behavioral problems, confusion, dysphoric mood, hallucinations, self-injury, sleep disturbance and suicidal ideas. The patient is nervous/anxious. The patient is not hyperactive.     Medications: I have reviewed the patient's current medications.  Current Outpatient Medications  Medication Sig Dispense Refill  . ALPRAZolam (XANAX) 1 MG tablet TAKE 1 TABLET BY MOUTH 3 TIMES A DAY AS NEEDED FOR ANXIETY (Patient taking differently: Take by mouth at bedtime. TAKE 1 TABLET BY MOUTH 3 TIMES A DAY AS NEEDED FOR ANXIETY) 30 tablet 4  . buPROPion (WELLBUTRIN XL) 300 MG 24 hr tablet Take 1 tablet (300 mg total) by mouth daily. (Patient taking differently: Take 300 mg by mouth in the morning.) 90 tablet 1  . Cholecalciferol (VITAMIN D3) 50 MCG (2000 UT) TABS Take 2,000 Units by mouth daily with breakfast.    . Cyanocobalamin (VITAMIN B 12 PO) Take 1 tablet by mouth daily with breakfast.     . lithium carbonate 150 MG capsule TAKE 3 CAPSULES (450 MG TOTAL) BY MOUTH EVERY EVENING. 270 capsule 0  . OLANZapine (ZYPREXA) 5 MG tablet Take 1 tablet (5 mg total) by mouth at bedtime. (Patient taking differently: Take 2.5 mg by mouth at bedtime.) 90 tablet 1  . omeprazole (PRILOSEC OTC) 20 MG tablet Take 20 mg by mouth at bedtime.    . Suvorexant (BELSOMRA) 20 MG TABS Take 1 tablet by mouth at bedtime. 30 tablet 5  . buPROPion (WELLBUTRIN XL) 150 MG 24 hr tablet TAKE 1 TABLET BY MOUTH EVERY DAY WITH 300MG  TABLET TO EQUAL 450MG  (Patient not taking: Reported on 03/17/2020) 90 tablet 1  . traZODone (DESYREL) 100 MG tablet TAKE 3 TABLETS BY MOUTH  EVERY DAY AT BEDTIME (Patient not taking: Reported on 03/17/2020) 270 tablet 1   No current facility-administered medications for this visit.    Medication Side Effects: None  Allergies: No Known Allergies  Past Medical History:  Diagnosis Date  . Abdominal pain   . Anal itching   . Breast inflammation   . Breast nodule    left  .  Chronic kidney disease   . Cystocele   . Decreased libido   . Depression   . Fibroadenoma   . Galactorrhea   . Mastodynia   . Pelvic pain in female   . Pelvic relaxation   . Rectocele   . Serotonin syndrome     Family History  Problem Relation Age of Onset  . Heart disease Mother        angioplasty  . Cancer Father        prostate  . Hypertension Father   . Depression Father   . Bipolar disorder Father   . Rheum arthritis Sister   . Rheum arthritis Brother   . Hypertension Maternal Grandmother   . Diabetes Maternal Grandmother        borderline  . Hypertension Paternal Grandmother   . Arthritis Paternal Grandmother   . Stroke Paternal Grandmother   . Rheum arthritis Paternal Grandfather   . Breast cancer Neg Hx    FHx: sister on olanzapine 5mg  and fluoxetine.  Social History   Socioeconomic History  . Marital status: Married    Spouse name: Not on file  . Number of children: 3  . Years of education: College  . Highest education level: Not on file  Occupational History  . Occupation: Surveyor, mining: Futures trader at [employer]  Tobacco Use  . Smoking status: Current Every Day Smoker    Packs/day: 0.50    Types: Cigarettes  . Smokeless tobacco: Never Used  Substance and Sexual Activity  . Alcohol use: Yes    Alcohol/week: 1.0 standard drink    Types: 1 Glasses of wine per week    Comment: one drink about once a month  . Drug use: No  . Sexual activity: Yes    Birth control/protection: Other-see comments    Comment: pt had hyst  Other Topics Concern  . Not on file  Social History Narrative  . Not on file    Social Determinants of Health   Financial Resource Strain: Not on file  Food Insecurity: Not on file  Transportation Needs: Not on file  Physical Activity: Not on file  Stress: Not on file  Social Connections: Not on file  Intimate Partner Violence: Not on file    Past Medical History, Surgical history, Social history, and Family history were reviewed and updated as appropriate.   Please see review of systems for further details on the patient's review from today.   Objective:   Physical Exam:  LMP 05/20/2010   Physical Exam Constitutional:      General: She is not in acute distress.    Appearance: She is well-developed.  Musculoskeletal:        General: No deformity.  Neurological:     Mental Status: She is alert and oriented to person, place, and time.     Cranial Nerves: No dysarthria.     Coordination: Coordination normal.  Psychiatric:        Attention and Perception: Attention and perception normal. She does not perceive auditory or visual hallucinations.        Mood and Affect: Mood is anxious. Mood is not depressed. Affect is not labile, blunt, angry or inappropriate.        Speech: Speech normal.        Behavior: Behavior normal. Behavior is cooperative.        Thought Content: Thought content normal. Thought content is not paranoid or delusional. Thought content does not include homicidal or suicidal  ideation. Thought content does not include homicidal or suicidal plan.        Cognition and Memory: Cognition and memory normal.        Judgment: Judgment normal.     Comments: Insight intact SI resolved. Dep resolved at present     Lab Review:     Component Value Date/Time   NA 140 03/01/2020 1731   K 3.8 03/01/2020 1731   CL 102 03/01/2020 1731   CO2 25 03/01/2020 1731   GLUCOSE 92 03/01/2020 1731   BUN 9 03/01/2020 1731   CREATININE 1.17 (H) 03/01/2020 1731   CALCIUM 10.0 03/01/2020 1731   PROT 6.8 03/01/2020 1731   ALBUMIN 4.5 03/01/2020 1731    AST 28 03/01/2020 1731   ALT 22 03/01/2020 1731   ALKPHOS 68 03/01/2020 1731   BILITOT 0.8 03/01/2020 1731   GFRNONAA 55 (L) 03/01/2020 1731   GFRAA >60 10/10/2015 1115       Component Value Date/Time   WBC 7.3 03/01/2020 1731   RBC 5.12 (H) 03/01/2020 1731   HGB 16.5 (H) 03/01/2020 1731   HCT 50.9 (H) 03/01/2020 1731   PLT 211 03/01/2020 1731   MCV 99.4 03/01/2020 1731   MCH 32.2 03/01/2020 1731   MCHC 32.4 03/01/2020 1731   RDW 11.9 03/01/2020 1731   LYMPHSABS 1.2 03/01/2020 1731   MONOABS 0.4 03/01/2020 1731   EOSABS 0.1 03/01/2020 1731   BASOSABS 0.1 03/01/2020 1731    Lithium Lvl  Date Value Ref Range Status  03/01/2020 0.41 (L) 0.60 - 1.20 mmol/L Final    Comment:    Performed at [facility], [place] [address] [place], [place] [address]     No results found for: PHENYTOIN, PHENOBARB, VALPROATE, CBMZ   Lithium level received April 27, 2018 was 0.7.  This is probably adequate to help reduce the risk of relapse of depression post [place].  Therefore no med changes.    Vitamin D level April 27, 2018 on supplement was 60 and adequate. .res Assessment: Plan:    Suhayla was seen today for follow-up, depression and anxiety.  Diagnoses and all orders for this visit:  Recurrent major depression resistant to treatment ([place])  Generalized anxiety disorder  Insomnia due to mental condition  Low vitamin D level    Greater than 50% of 25 min non face to face time with patient was spent on counseling and coordination of care. 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  Reviewed multiple failed medications noted above.  She failed the attempt to wean Zyprexa because of worsening anxiety.  Anxiety is helped with 2.5 mg Zyprexa daily.  Continue trazodone 300 mg nightly. It may be having some beneficial effect for depression at the higher dose. The increase in Wellbutrin back to 450 mg daily has led to better control of depression as well.  She is jittery but tolerating it and  find it's benefits are worth the shakiness. Overall depression is better controlled that it has been in some time.  She still has some sleep problems despite several meds to help with sleep.  Defer per her request NAC 600 mg or more daily.  She feels her protein intake is adequate and her weight is stable.  continue Belsomra at 10 mg HS.   Energy is better and no depression.  B12 seemed to help.   Option Aricept DT memory concerns.  She doesn't want more meds.  Her SI is gone after increasing lithium to 450 mg for SI and  depression.  Depression resolved.  Discussed potential metabolic side effects associated with atypical antipsychotics, as well as potential risk for movement side effects. Advised pt to contact office if movement side effects occur.   She felt 50% better with last [place] but 100% better with first round [place].  Counseled patient regarding potential benefits, risks, and side effects of lithium to include potential risk of lithium affecting thyroid and renal function.  Discussed need for periodic lab monitoring to determine drug level and to assess for potential adverse effects.  Counseled patient regarding signs and symptoms of lithium toxicity and advised that they notify office immediately or seek urgent medical attention if experiencing these signs and symptoms.  Patient advised to contact office with any questions or concerns.  Disc lithium and toxicity. Lithium level good from April 11, 2019 at 0.7.   See PCP about balance px.  May need neuro consult.  Vitamin D 5000 units daily  FU 3 mos  Lynder Parents, MD, DFAPA    Please see After Visit Summary for patient specific instructions.  Future Appointments  Date Time Provider [place]  04/14/2020 10:00 AM [facility]  04/15/2020  9:15 AM McDonald, Stephan Minister, DPM [facility]    No orders of the defined types were placed in this encounter.    Lynder Parents, MD, DFAPA    -------------------------------

## 2020-04-14 ENCOUNTER — Ambulatory Visit: Payer: BC Managed Care – PPO

## 2020-04-15 ENCOUNTER — Ambulatory Visit: Payer: BC Managed Care – PPO | Admitting: Podiatry

## 2020-04-16 ENCOUNTER — Ambulatory Visit: Payer: BC Managed Care – PPO

## 2020-05-28 ENCOUNTER — Other Ambulatory Visit: Payer: Self-pay

## 2020-05-28 ENCOUNTER — Ambulatory Visit
Admission: RE | Admit: 2020-05-28 | Discharge: 2020-05-28 | Disposition: A | Discharge status: Home / Self Care | Payer: BC Managed Care – PPO | Source: Ambulatory Visit | Attending: Obstetrics and Gynecology | Admitting: Obstetrics and Gynecology

## 2020-05-28 DIAGNOSIS — Z1231 Encounter for screening mammogram for malignant neoplasm of breast: Secondary | ICD-10-CM

## 2020-05-29 ENCOUNTER — Telehealth: Payer: Self-pay | Admitting: Psychiatry

## 2020-05-29 ENCOUNTER — Other Ambulatory Visit: Payer: Self-pay | Admitting: Psychiatry

## 2020-05-29 DIAGNOSIS — F5105 Insomnia due to other mental disorder: Secondary | ICD-10-CM

## 2020-05-29 MED ORDER — BELSOMRA 20 MG PO TABS: 1.0000 | ORAL_TABLET | Freq: Every day | ORAL | 5 refills | Status: DC

## 2020-05-29 MED ORDER — ALPRAZOLAM 1 MG PO TABS: ORAL_TABLET | ORAL | 4 refills | Status: DC

## 2020-05-29 NOTE — Telephone Encounter (Signed)
Next visit is 06/17/20. Requesting refill on Belsomra and Alprazolam called to [facility] on [address], [place], [place] and phone number is [phone].

## 2020-06-03 ENCOUNTER — Other Ambulatory Visit: Payer: Self-pay | Admitting: Obstetrics and Gynecology

## 2020-06-03 DIAGNOSIS — R928 Other abnormal and inconclusive findings on diagnostic imaging of breast: Secondary | ICD-10-CM

## 2020-06-10 ENCOUNTER — Other Ambulatory Visit: Payer: Self-pay | Admitting: Psychiatry

## 2020-06-10 DIAGNOSIS — F339 Major depressive disorder, recurrent, unspecified: Secondary | ICD-10-CM

## 2020-06-17 ENCOUNTER — Other Ambulatory Visit: Payer: Self-pay

## 2020-06-17 ENCOUNTER — Ambulatory Visit: Payer: BC Managed Care – PPO

## 2020-06-17 ENCOUNTER — Ambulatory Visit (INDEPENDENT_AMBULATORY_CARE_PROVIDER_SITE_OTHER): Payer: BC Managed Care – PPO | Admitting: Psychiatry

## 2020-06-17 ENCOUNTER — Encounter: Payer: Self-pay | Admitting: Psychiatry

## 2020-06-17 ENCOUNTER — Ambulatory Visit
Admission: RE | Admit: 2020-06-17 | Discharge: 2020-06-17 | Disposition: A | Discharge status: Home / Self Care | Payer: BC Managed Care – PPO | Source: Ambulatory Visit | Attending: Obstetrics and Gynecology | Admitting: Obstetrics and Gynecology

## 2020-06-17 DIAGNOSIS — F411 Generalized anxiety disorder: Secondary | ICD-10-CM | POA: Diagnosis not present

## 2020-06-17 DIAGNOSIS — R928 Other abnormal and inconclusive findings on diagnostic imaging of breast: Secondary | ICD-10-CM

## 2020-06-17 DIAGNOSIS — F5105 Insomnia due to other mental disorder: Secondary | ICD-10-CM

## 2020-06-17 DIAGNOSIS — T50905A Adverse effect of unspecified drugs, medicaments and biological substances, initial encounter: Secondary | ICD-10-CM

## 2020-06-17 DIAGNOSIS — F339 Major depressive disorder, recurrent, unspecified: Secondary | ICD-10-CM | POA: Diagnosis not present

## 2020-06-17 DIAGNOSIS — R7989 Other specified abnormal findings of blood chemistry: Secondary | ICD-10-CM

## 2020-06-17 DIAGNOSIS — R635 Abnormal weight gain: Secondary | ICD-10-CM

## 2020-06-17 MED ORDER — METFORMIN HCL 500 MG PO TABS: ORAL_TABLET | ORAL | 2 refills | Status: DC

## 2020-06-17 NOTE — Progress Notes (Signed)
[name] [id number] 1965-05-23 55 y.o.     Subjective:   Patient ID:  [name] is a 55 y.o. (DOB [birth year]) female.  Chief Complaint:  Chief Complaint  Patient presents with  . Follow-up  . Recurrent major depression resistant to treatment ([place])  . Depression  . Anxiety  . Sleeping Problem    Depression        Associated symptoms include decreased concentration.  Associated symptoms include no fatigue, no appetite change and no suicidal ideas. Medication Refill Pertinent negatives include no abdominal pain, fatigue or weakness.    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  At visit March 2020.  She had completed [place] early December.  "It worked"  and had resolution of her depression!  She started Wellbutrin XL 300 mg daily for relapse prevention.  She continued olanzapine 2.5 mg every afternoon because her anxiety relapsed when she would try to completely discontinue it.  Stopped lithium DT SE and felt worse and restarted 300 daily.   In may 2020 pt taking lithium 600 mg and CO balance problems.  She stopped lithium and lithium level at 62 hours later was 0.4, suggesting possible prior toxicity so the lithium was reduced to 300 mg.    in June.  Because Wellbutrin had affected her sleep we switched it to Wellbutrin SR 200 mg every morning and encouraged her to reduce caffeine.  She had felt well but Wellbutrin XL 300 mg made her edgy and caused some insomnia.  She was also encouraged to take NAC as a supplement daily.  She called back September 14 stating that she wanted to switch back to Wellbutrin XL 300 mg because she felt that SR 200 mg was not working sufficiently.  She was cautioned about it contributing to insomnia because there was a concern and had done that in the past.  At visit January 15, 2019 the following was noted: Started a new job temporarily which  didn't work.  Was so nervous going and needed 1/2 Xanax to go to work.  Couldn't meed the schedule demands Still has balance issues and memory. She reduced the lithium from 300 to 150 mg daily bc she doesn't like taking a lot of meds and husband doesn't either.  She made the change on her own. Restarted [place] July August 2020 and felt much better.  Seeing [name] at [facility].  .  Mostly resolved with this tx and finished end of August.  Was good for a month and then the depression returned.  Kind of avoiding people, not severe.  But had periods of "horrific suicidal thoughts" though comments to safety.   Siister died 01/08/23 COPD and was close to her.   visit Jan 15, 2019 and increased lithium to 450, suggested B12 for energy.  seen March 27, 2019.  The following was noted: Anxiety is helped with 2.5 mg Zyprexa daily. Option increase nefazodone again.  Some compliance issues. She doesn't like taking so much meds. incr Wellbutrin XL 450 mg AM if tolerated.  Disc SE including more anxiety, insomnia. Trial B12 to see if energy is better in the morning. Her SI is gone after increasing lithium to 450 mg for SI and depression.  Continue this She continues sleep meds including Belsomra 20 mg nightly Requested lithium level  Last seen May 08, 2019.  The following was noted Increased Wellbutrin 450 too much jitteriness.  So reduced to  300 mg. Terrible STM. Loses track of thoughts in the middle of sentences.  Poor penmanship comes and goes. Sometimes when walks right foot sticks to the ground with one fall.   So tired.   SI resolved overnight with increased lithium.  Still having problems with balance.  Some word-finding issues and anxiety.  Energy is better.  Sleep is good at this time. Some benefit with incr Wellbutrin.  Exhausted.  At times has to lay down.  Hard to get up and go work out in the morning.  Lower nefazadone 200 mg did not help her energy and her sleep was worse.     But  still tired.  During [place] no depression and no SI but still tired.   Depression has recurred to a moderate level anxiety is still moderate. Stays busy and active.  Exercising still.  Lost weight with Wellbutrin.  Jobless not good. Anxious at times..  But kids around.  Function is ok.  Doesn't eat much protein.  Eats peanut butter, no eggs, no meat.   Sleeping with Belsomra and Xanax. 9-10 hours CO dry mouth and cavities.  Metal taste in her mouth.  Easily confused.   No meds were changed.  By early 2021 nefazodone was no longer manufactured and then became unavailable to her abruptly.  She called July 11, 2019 and the 2 best options were felt to be either switch to higher dose trazodone though it is probably not as effective as an antidepressant as is nefazodone or the other option Viibryd.  She chose to switch to trazodone because when she discontinued nefazodone her initial chief complaint was insomnia.  07/17/2019 appointment the following is reported:  Hit a wall with abrupt stopping of nefazodone.  Still feels wired. Up at night cleaning.  No wreckless impulsivity.  Head is fuzzy.   Started trazodone 100 mg is helping some. Last 2 nights forgot meds.  Sleep fair with trazodone.  No SE.   Felt wired off nefazodone Down to 1/2 Xanax for 5 days or longer.  Wants to stop it. Don't feel depressed. Lost appetite off nefazodone and losing weight. Recommendation was to increase trazodone gradually to 300 mg nightly if tolerated to help compensate for the absence of nefazodone and hopes of helping depression.  07/30/2019 phone call complaining of muscle weakness. 08/30/2019 phone call asking to increase Wellbutrin from 300 to 450 mg daily.  She had tried this in the past but it made her jittery but it was agreed she could retry it.  09/17/2019 appointment with the following noted: Overall doing well.  Increase trazodone to 300 mg HS and still can't sleep well at times. Feels better with higher dose  Wellbutrin and it didn't seem to worsen  Sleep. Tolerating changes except tremor. No taste for coffee any more.  No smoking in a year.  Wellbutrin helped.  Pilar Plate and kids good.   Better energy and less fatigue.  Less depression. Chronic sleep issues.  No anxiety.  Appetite OK.  Just don't eat like most people but does get protein.  Took a month off nefazodone to adjust.   Plan: Trial of Dayvigo in place of Belsomra at 5-10 mg HS. if it works better call back and let us know and we will change the prescription.  12/20/2019 appt with the following noted: Dayvigo not covered by insurance and made her a little dizzy in AM. Overall satisfied with Belsomra. CO metal taste in her mouth for a long time. CO forgetful and  loses train of thought.  Wonders if related to psych treatment She's reduced trzodone to 100 mg HS.  High doses made her dizzy. No depression.  Boys had Covid. She and H not vaccinated. Plan: No med changes  03/17/2020 appointment with the following noted: Disc recent accidental OD Wellbutrin with doubling dose.  It was horrible on 900 mg Wellbutrin.    Now only taking 300 mg Wellbutrin but H worries.  Better at 450 mg but is relatively OK.  Not sig depressed.  Satisfied for now. 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 Weaned off trazodone and is OK.  Sleep is inconsistent and then tired if that happens.  Asked questions about psychadelics in news for info for mental health problems.  Some anxiety lately. Plan:   No med changes  06/17/2020 appointment with following noted: Went back to Wellbutrin 450 mid January to help depression. No SE Not depressed. Li SE taste pennies comes and goes. Tried again to slowly taper off olanzapine and relapsed again and back on 2.5 mg HS and is better.  Trouble with weight in middle of gut. Lysine helped tongue soreness.   Never got Covid.  Despite family got it.   Long history of severe treatment resistant depression having failed multiple  medications including imipramine,  paroxeine , nefazodone,   Viibryd, Prozac, venlafaxine, Emsam 9 mg, Wellbutrin 450, Pristiq, , mirtazapine nefazodone 600 sedation, duloxetine 90 mg, Trintellix, lithium 600, lamotrigine ? Response, Deplin, TMS, Latuda, lithium, olanzapine, quetiapine,  Rexulti caused NMS, Abilify, Vyvanse,  pramipexole,  ropinirole,  ProSom, Belsomra,  Vyvanse,  Provigil,  temazepam, Xanax, doxepin, Lunesta, gabapentin, Zofran, and she did respond to ECT.,   NAC  Review of Systems:  Review of Systems  Constitutional: Positive for unexpected weight change. Negative for appetite change and fatigue.  HENT: Negative for dental problem.   Gastrointestinal: Negative for abdominal pain.  Neurological: Negative for tremors and weakness.  Psychiatric/Behavioral: Positive for decreased concentration and depression. Negative for agitation, behavioral problems, confusion, dysphoric mood, hallucinations, self-injury, sleep disturbance and suicidal ideas. The patient is nervous/anxious. The patient is not hyperactive.     Medications: I have reviewed the patient's current medications.  Current Outpatient Medications  Medication Sig Dispense Refill  . buPROPion (WELLBUTRIN XL) 150 MG 24 hr tablet TAKE 1 TABLET BY MOUTH EVERY DAY WITH 300MG  TABLET TO EQUAL 450MG  90 tablet 1  . buPROPion (WELLBUTRIN XL) 300 MG 24 hr tablet Take 1 tablet (300 mg total) by mouth daily. (Patient taking differently: Take 300 mg by mouth in the morning.) 90 tablet 1  . Cholecalciferol (VITAMIN D3) 50 MCG (2000 UT) TABS Take 2,000 Units by mouth daily with breakfast.    . Cyanocobalamin (VITAMIN B 12 PO) Take 1 tablet by mouth daily with breakfast.     . lithium carbonate 150 MG capsule TAKE 3 CAPSULES (450 MG TOTAL) BY MOUTH EVERY EVENING. 270 capsule 1  . LYSINE PO Take by mouth.    . metFORMIN (GLUCOPHAGE) 500 MG tablet 1 daily for 1 week, then 1 twice daily 60 tablet 2  . OLANZapine (ZYPREXA) 5 MG tablet  Take 1 tablet (5 mg total) by mouth at bedtime. (Patient taking differently: Take 2.5 mg by mouth at bedtime. 1/2 tab at bedtime.) 90 tablet 1  . omeprazole (PRILOSEC OTC) 20 MG tablet Take 20 mg by mouth at bedtime.    . Suvorexant (BELSOMRA) 20 MG TABS Take 1 tablet by mouth at bedtime. 30 tablet 5  . traZODone (DESYREL) 100  MG tablet TAKE 3 TABLETS BY MOUTH EVERY DAY AT BEDTIME (Patient taking differently: Pt reports she only takes a quarter of a tab once a day.) 270 tablet 1  . ALPRAZolam (XANAX) 1 MG tablet TAKE 1 TABLET BY MOUTH 3 TIMES A DAY AS NEEDED FOR ANXIETY (Patient taking differently: TAKE 1 TABLET BY MOUTH once a day) 30 tablet 4   No current facility-administered medications for this visit.    Medication Side Effects: None  Allergies: No Known Allergies  Past Medical History:  Diagnosis Date  . Abdominal pain   . Anal itching   . Breast inflammation   . Breast nodule    left  . Chronic kidney disease   . Cystocele   . Decreased libido   . Depression   . Fibroadenoma   . Galactorrhea   . Mastodynia   . Pelvic pain in female   . Pelvic relaxation   . Rectocele   . Serotonin syndrome     Family History  Problem Relation Age of Onset  . Heart disease Mother        angioplasty  . Cancer Father        prostate  . Hypertension Father   . Depression Father   . Bipolar disorder Father   . Rheum arthritis Sister   . Rheum arthritis Brother   . Hypertension Maternal Grandmother   . Diabetes Maternal Grandmother        borderline  . Hypertension Paternal Grandmother   . Arthritis Paternal Grandmother   . Stroke Paternal Grandmother   . Rheum arthritis Paternal Grandfather   . Breast cancer Neg Hx    FHx: sister on olanzapine 5mg  and fluoxetine.  Social History   Socioeconomic History  . Marital status: Married    Spouse name: Not on file  . Number of children: 3  . Years of education: College  . Highest education level: Not on file  Occupational  History  . Occupation: Surveyor, mining: Futures trader at [employer]  Tobacco Use  . Smoking status: Current Every Day Smoker    Packs/day: 0.50    Types: Cigarettes  . Smokeless tobacco: Never Used  Substance and Sexual Activity  . Alcohol use: Yes    Alcohol/week: 1.0 standard drink    Types: 1 Glasses of wine per week    Comment: one drink about once a month  . Drug use: No  . Sexual activity: Yes    Birth control/protection: Other-see comments    Comment: pt had hyst  Other Topics Concern  . Not on file  Social History Narrative  . Not on file   Social Determinants of Health   Financial Resource Strain: Not on file  Food Insecurity: Not on file  Transportation Needs: Not on file  Physical Activity: Not on file  Stress: Not on file  Social Connections: Not on file  Intimate Partner Violence: Not on file    Past Medical History, Surgical history, Social history, and Family history were reviewed and updated as appropriate.   Please see review of systems for further details on the patient's review from today.   Objective:   Physical Exam:  LMP 05/20/2010   Physical Exam Constitutional:      General: She is not in acute distress.    Appearance: She is well-developed.  Musculoskeletal:        General: No deformity.  Neurological:     Mental Status: She is alert and oriented to person,  place, and time.     Cranial Nerves: No dysarthria.     Coordination: Coordination normal.  Psychiatric:        Attention and Perception: Attention and perception normal. She does not perceive auditory or visual hallucinations.        Mood and Affect: Mood is anxious. Mood is not depressed. Affect is not labile, blunt, angry or inappropriate.        Speech: Speech normal.        Behavior: Behavior normal. Behavior is cooperative.        Thought Content: Thought content normal. Thought content is not paranoid or delusional. Thought content does not include homicidal or  suicidal ideation. Thought content does not include homicidal or suicidal plan.        Cognition and Memory: Cognition and memory normal.        Judgment: Judgment normal.     Comments: Insight intact SI resolved. Dep resolved at present     Lab Review:     Component Value Date/Time   NA 140 03/01/2020 1731   K 3.8 03/01/2020 1731   CL 102 03/01/2020 1731   CO2 25 03/01/2020 1731   GLUCOSE 92 03/01/2020 1731   BUN 9 03/01/2020 1731   CREATININE 1.17 (H) 03/01/2020 1731   CALCIUM 10.0 03/01/2020 1731   PROT 6.8 03/01/2020 1731   ALBUMIN 4.5 03/01/2020 1731   AST 28 03/01/2020 1731   ALT 22 03/01/2020 1731   ALKPHOS 68 03/01/2020 1731   BILITOT 0.8 03/01/2020 1731   GFRNONAA 55 (L) 03/01/2020 1731   GFRAA >60 10/10/2015 1115       Component Value Date/Time   WBC 7.3 03/01/2020 1731   RBC 5.12 (H) 03/01/2020 1731   HGB 16.5 (H) 03/01/2020 1731   HCT 50.9 (H) 03/01/2020 1731   PLT 211 03/01/2020 1731   MCV 99.4 03/01/2020 1731   MCH 32.2 03/01/2020 1731   MCHC 32.4 03/01/2020 1731   RDW 11.9 03/01/2020 1731   LYMPHSABS 1.2 03/01/2020 1731   MONOABS 0.4 03/01/2020 1731   EOSABS 0.1 03/01/2020 1731   BASOSABS 0.1 03/01/2020 1731    Lithium Lvl  Date Value Ref Range Status  03/01/2020 0.41 (L) 0.60 - 1.20 mmol/L Final    Comment:    Performed at [facility], [place] [address] [place], [place] [address]     No results found for: PHENYTOIN, PHENOBARB, VALPROATE, CBMZ   Lithium level received April 27, 2018 was 0.7.  This is probably adequate to help reduce the risk of relapse of depression post [place].  Therefore no med changes.    Vitamin D level April 27, 2018 on supplement was 60 and adequate. .res Assessment: Plan:    Akya was seen today for follow-up, recurrent major depression resistant to treatment ([facility]), depression, anxiety and sleeping problem.  Diagnoses and all orders for this visit:  Recurrent major depression resistant to treatment  ([place])  Generalized anxiety disorder  Insomnia due to mental condition  Low vitamin D level  Weight gain due to medication -     metFORMIN (GLUCOPHAGE) 500 MG tablet; 1 daily for 1 week, then 1 twice daily    Greater than 50% of 25 min non face to face time with patient was spent on counseling and coordination of care. 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  Reviewed multiple failed medications noted above.  She failed the attempt to wean Zyprexa because of worsening anxiety.  Anxiety is helped with 2.5 mg  Zyprexa daily.  Continue trazodone 300 mg nightly. It may be having some beneficial effect for depression at the higher dose. The increase in Wellbutrin back to 450 mg daily has led to better control of depression as well.  She is jittery but tolerating it and find it's benefits are worth the shakiness. Overall depression is better controlled that it has been in some time.  She still has some sleep problems despite several meds to help with sleep.  Defer per her request NAC 600 mg or more daily.  She feels her protein intake is adequate and her weight is stable.  continue Belsomra at 10 mg HS.   Metformin 250 daily and increase to 500 mg BID  Energy is better and no depression.  B12 seemed to help.   Option Aricept DT memory concerns.  She doesn't want more meds.  Her SI is gone after increasing lithium to 450 mg for SI and depression.  Depression resolved.  Discussed potential metabolic side effects associated with atypical antipsychotics, as well as potential risk for movement side effects. Advised pt to contact office if movement side effects occur.   She felt 50% better with last [place] but 100% better with first round [place].  Counseled patient regarding potential benefits, risks, and side effects of lithium to include potential risk of lithium affecting thyroid and renal function.  Discussed need for periodic lab monitoring to determine drug level and to assess for potential adverse  effects.  Counseled patient regarding signs and symptoms of lithium toxicity and advised that they notify office immediately or seek urgent medical attention if experiencing these signs and symptoms.  Patient advised to contact office with any questions or concerns.  Disc lithium and toxicity. Lithium level good from April 11, 2019 at 0.7.   See PCP about balance px.  May need neuro consult.  Vitamin D 5000 units daily  FU 3 mos  Lynder Parents, MD, DFAPA    Please see After Visit Summary for patient specific instructions.  No future appointments.  No orders of the defined types were placed in this encounter.    Lynder Parents, MD, DFAPA   -------------------------------

## 2020-07-07 ENCOUNTER — Other Ambulatory Visit: Payer: Self-pay | Admitting: Psychiatry

## 2020-07-07 DIAGNOSIS — F339 Major depressive disorder, recurrent, unspecified: Secondary | ICD-10-CM

## 2020-07-25 ENCOUNTER — Other Ambulatory Visit: Payer: Self-pay | Admitting: Psychiatry

## 2020-07-25 ENCOUNTER — Telehealth: Payer: Self-pay | Admitting: Psychiatry

## 2020-07-25 DIAGNOSIS — R635 Abnormal weight gain: Secondary | ICD-10-CM

## 2020-07-25 DIAGNOSIS — T50905A Adverse effect of unspecified drugs, medicaments and biological substances, initial encounter: Secondary | ICD-10-CM

## 2020-07-25 MED ORDER — METFORMIN HCL 500 MG PO TABS: 750.0000 mg | ORAL_TABLET | Freq: Two times a day (BID) | ORAL | 1 refills | Status: DC

## 2020-07-25 NOTE — Telephone Encounter (Signed)
Tell her she can increase the metformin to 1-1/2 tablets twice daily or if she prefers one with each meal.  That we will take her from 2 tablets a day to 3 tablets daily.  The main side effect risk is nausea or upset stomach.  Let us know if she has a problem.  It needs to be taken with food

## 2020-07-25 NOTE — Telephone Encounter (Signed)
Pt has been informed.

## 2020-07-25 NOTE — Telephone Encounter (Signed)
Pt called and said that the metformin she has been taking 2 pills daaily . She has not seen any benefit from it . She would like to either increase this medicine or try something different. Please give her a call at [phone]

## 2020-07-25 NOTE — Telephone Encounter (Signed)
Please review

## 2020-07-30 ENCOUNTER — Telehealth: Payer: Self-pay | Admitting: Psychiatry

## 2020-07-30 NOTE — Telephone Encounter (Signed)
error 

## 2020-08-04 ENCOUNTER — Other Ambulatory Visit: Payer: Self-pay | Admitting: Psychiatry

## 2020-08-04 DIAGNOSIS — F339 Major depressive disorder, recurrent, unspecified: Secondary | ICD-10-CM

## 2020-08-04 DIAGNOSIS — F411 Generalized anxiety disorder: Secondary | ICD-10-CM

## 2020-08-04 NOTE — Telephone Encounter (Signed)
[name] called in with refill request for Bupropion 150mg  and Bupropin 300mg . Appt 6/15. Pharmacy [facility]

## 2020-08-04 NOTE — Telephone Encounter (Signed)
Pt already has a refill on file for both medications,she needs to call the pharmacy.

## 2020-08-04 NOTE — Telephone Encounter (Signed)
Your note says she is taking 2.5 mg daily.Should I change rx to take 1/2 tab daily #135 90 day supply?

## 2020-08-13 ENCOUNTER — Encounter: Payer: Self-pay | Admitting: Physician Assistant

## 2020-08-13 ENCOUNTER — Ambulatory Visit: Payer: BC Managed Care – PPO | Admitting: Physician Assistant

## 2020-08-13 VITALS — BP 124/80 | HR 78 | Ht 63.0 in | Wt 124.2 lb

## 2020-08-13 DIAGNOSIS — R194 Change in bowel habit: Secondary | ICD-10-CM

## 2020-08-13 NOTE — Progress Notes (Signed)
Chief Complaint: Change in bowel habit  HPI:    [name] is a 55 year old female with a past medical history as listed below, known to Dr. Loletha Carrow, who was referred to me by Josefa Half* for a complaint of change in bowel habits.      11/16/2017 colonoscopy to the terminal ileum was normal except for diminutive cecal tubular adenoma.  Random colon biopsies were negative for microscopic colitis.    12/27/2017 patient seen in clinic by Dr. Loletha Carrow for chronic diarrhea.  At that time described feeling generally well with a good appetite and stable weight and most the time she would have normal bowel movements but perhaps once a week she would have a day of lower abdominal pain, urgency and loose stool then she may not have a bowel movement for a day or 2.  At that time as discussed her symptoms were consistent with IBS.  She was offered trial of an antispasmodic but declined.  Repeat colonoscopy is recommended in 5 years.    07/07/2020 CBC, CMP, lipids, TSH essentially normal.    08/08/2020 patient saw her PCP for oral sore and tongue pain.  She was given Dexamethasone elixir.    Today, the patient presents to clinic and tells me that she has been very regular for as long as she can remember in her life as far as bowel movements but for the past year she has had occasional issues maybe for 2 to 3 days of constipation, then in the past 3 to 4 months this has become more frequent telling me that it is "just more of a problem".  Expresses that she will have the urge to have a stool and pass a lot of gas but cannot actually pass a stool and feels very "heavy" towards the bottom of her abdomen and will have terrible smelling gas.  When she does pass a stool which is maybe once every 3 to 4 days she has to "bear down like I am having a child".  Explains that the stools are not hard they are still soft and solid.  She has added in Magnesium, Metamucil and is eating prunes daily in addition to the water that  she drinks excessively every day and her normal exercise routine but nothing is really working.  Last week things seem to go back to normal but now she is backed up again.  Tells me nothing has changed in her life as far as stress/anxiety level, exercise level, water intake, or medications.    Denies fever, chills, weight loss, blood in her stool or symptoms that awaken her from sleep.  Past Medical History:  Diagnosis Date  . Abdominal pain   . Anal itching   . Breast inflammation   . Breast nodule    left  . Chronic kidney disease   . Cystocele   . Decreased libido   . Depression   . Fibroadenoma   . Galactorrhea   . Mastodynia   . Pelvic pain in female   . Pelvic relaxation   . Rectocele   . Serotonin syndrome     Past Surgical History:  Procedure Laterality Date  . ABDOMINAL HYSTERECTOMY     partial  . BREAST BIOPSY    . HERNIA REPAIR    . INCONTINENCE SURGERY    . OVARIAN CYST REMOVAL     left    Current Outpatient Medications  Medication Sig Dispense Refill  . ALPRAZolam (XANAX) 1 MG tablet TAKE 1 TABLET BY  MOUTH 3 TIMES A DAY AS NEEDED FOR ANXIETY (Patient taking differently: TAKE 1 TABLET BY MOUTH once a day) 30 tablet 4  . buPROPion (WELLBUTRIN XL) 150 MG 24 hr tablet TAKE 1 TABLET BY MOUTH EVERY DAY WITH 300MG  TABLET TO EQUAL 450MG  90 tablet 1  . buPROPion (WELLBUTRIN XL) 300 MG 24 hr tablet TAKE 1 TABLET BY MOUTH EVERY DAY 90 tablet 1  . Cholecalciferol (VITAMIN D3) 50 MCG (2000 UT) TABS Take 2,000 Units by mouth daily with breakfast.    . Cyanocobalamin (VITAMIN B 12 PO) Take 1 tablet by mouth daily with breakfast.     . lithium carbonate 150 MG capsule TAKE 3 CAPSULES (450 MG TOTAL) BY MOUTH EVERY EVENING. 270 capsule 1  . LYSINE PO Take by mouth.    . metFORMIN (GLUCOPHAGE) 500 MG tablet Take 1.5 tablets (750 mg total) by mouth 2 (two) times daily with a meal. 1 daily for 1 week, then 1 twice daily 90 tablet 1  . OLANZapine (ZYPREXA) 5 MG tablet TAKE 1 TABLET  BY MOUTH EVERYDAY AT BEDTIME 90 tablet 0  . omeprazole (PRILOSEC OTC) 20 MG tablet Take 20 mg by mouth at bedtime.    . Suvorexant (BELSOMRA) 20 MG TABS Take 1 tablet by mouth at bedtime. 30 tablet 5  . traZODone (DESYREL) 100 MG tablet TAKE 3 TABLETS BY MOUTH EVERY DAY AT BEDTIME (Patient taking differently: Pt reports she only takes a quarter of a tab once a day.) 270 tablet 1   No current facility-administered medications for this visit.    Allergies as of 08/13/2020  . (No Known Allergies)    Family History  Problem Relation Age of Onset  . Heart disease Mother        angioplasty  . Cancer Father        prostate  . Hypertension Father   . Depression Father   . Bipolar disorder Father   . Rheum arthritis Sister   . Rheum arthritis Brother   . Hypertension Maternal Grandmother   . Diabetes Maternal Grandmother        borderline  . Hypertension Paternal Grandmother   . Arthritis Paternal Grandmother   . Stroke Paternal Grandmother   . Rheum arthritis Paternal Grandfather   . Breast cancer Neg Hx     Social History   Socioeconomic History  . Marital status: Married    Spouse name: Not on file  . Number of children: 3  . Years of education: College  . Highest education level: Not on file  Occupational History  . Occupation: Surveyor, mining: Futures trader at [employer]  Tobacco Use  . Smoking status: Current Every Day Smoker    Packs/day: 0.50    Types: Cigarettes  . Smokeless tobacco: Never Used  Substance and Sexual Activity  . Alcohol use: Yes    Alcohol/week: 1.0 standard drink    Types: 1 Glasses of wine per week    Comment: one drink about once a month  . Drug use: No  . Sexual activity: Yes    Birth control/protection: Other-see comments    Comment: pt had hyst  Other Topics Concern  . Not on file  Social History Narrative  . Not on file   Social Determinants of Health   Financial Resource Strain: Not on file  Food Insecurity: Not on  file  Transportation Needs: Not on file  Physical Activity: Not on file  Stress: Not on file  Social Connections: Not  on file  Intimate Partner Violence: Not on file    Review of Systems:    Constitutional: No weight loss, fever or chills Cardiovascular: No chest pain Respiratory: No SOB  Gastrointestinal: See HPI and otherwise negative   Physical Exam:  Vital signs: BP 124/80   Pulse 78   Ht 5\' 3"  (1.6 m)   Wt 124 lb 4 oz (56.4 kg)   LMP 05/20/2010   BMI 22.01 kg/m   Constitutional:   Pleasant Caucasian female appears to be in NAD, Well developed, Well nourished, alert and cooperative Respiratory: Respirations even and unlabored. Lungs clear to auscultation bilaterally.   No wheezes, crackles, or rhonchi.  Cardiovascular: Normal S1, S2. No MRG. Regular rate and rhythm. No peripheral edema, cyanosis or pallor.  Gastrointestinal:  Soft, mild distensions, nontender. No rebound or guarding. Normal bowel sounds. No appreciable masses or hepatomegaly. Rectal:  Not performed.  Psychiatric: Oriented to person, place and time. Demonstrates good judgement and reason without abnormal affect or behaviors.  See HPI for recent labs and imaging.  Assessment: 1.  Change in bowel habits: Towards constipation slowly over the past year, worse over the past 3 to 4 months with no help from Metamucil, prunes or Magnesium; consider IBS-C (patient does have history of likely IBS-D in the past, now it may just be more of a mixed picture) versus change due to age versus  Plan: 1.  Discussed with patient that she should try MiraLAX.  Discussed titration of this up to 4 times a day if necessary. 2.  Patient can discontinue all other products for now and just use MiraLAX as above.  If MiraLAX is successful for her she can continue this, if it is not she should call and let us know over the next 2 to 4 weeks.  At that time would recommend a colonoscopy given the change in bowel habits.  Patient verbalized  understanding and will stay in contact with [place]. 3.  Patient to follow in clinic as needed with [place] in the future.  Ellouise Newer, PA-C [facility] 08/13/2020, 2:51 PM  Cc: Josefa Half*

## 2020-08-13 NOTE — Patient Instructions (Signed)
If you are age 56 or older, your body mass index should be between 23-30. Your Body mass index is 22.01 kg/m. If this is out of the aforementioned range listed, please consider follow up with your Primary Care Provider.  If you are age 76 or younger, your body mass index should be between 19-25. Your Body mass index is 22.01 kg/m. If this is out of the aformentioned range listed, please consider follow up with your Primary Care Provider.   Please start Miralax once daily you may increase up to 4 time daily if needed.  Thank you for choosing me and [facility].  Ellouise Newer, PA-C

## 2020-08-15 NOTE — Progress Notes (Signed)
____________________________________________________________  Attending physician addendum:  Thank you for sending this case to me. I have reviewed the entire note and agree with the plan.  This sounds most like a structural or functional pelvic floor problem, and she has a history of rectocele (? Repair).  Less likely obstructive/neoplastic with last colonoscopy in 2019. If not significantly improved on miralax, needs a colonoscopy with me.  If unrevealing, needs further workup for rectocele and PFD.  Wilfrid Lund, MD  ____________________________________________________________

## 2020-08-19 ENCOUNTER — Telehealth: Payer: Self-pay | Admitting: Psychiatry

## 2020-08-19 NOTE — Telephone Encounter (Signed)
Please review.Traci can you call her please ?

## 2020-08-19 NOTE — Telephone Encounter (Signed)
[name] called in stating " I haven't lost anything" regarding medication she did not wish to say anything further.. I inquired about what medication she was talking about and she said that CC would know. [place] RTC [phone]

## 2020-08-20 NOTE — Telephone Encounter (Signed)
Called patient to try to get more detail on the message.  Got her voicemail.  Left a detailed message that if she needs a refill just let us know but if there is something more urgent also let us know about that.

## 2020-08-21 ENCOUNTER — Other Ambulatory Visit: Payer: Self-pay | Admitting: Psychiatry

## 2020-08-22 ENCOUNTER — Telehealth: Payer: Self-pay | Admitting: Psychiatry

## 2020-08-22 ENCOUNTER — Other Ambulatory Visit: Payer: Self-pay | Admitting: Psychiatry

## 2020-08-22 DIAGNOSIS — R635 Abnormal weight gain: Secondary | ICD-10-CM

## 2020-08-22 MED ORDER — METFORMIN HCL 500 MG PO TABS: 1000.0000 mg | ORAL_TABLET | Freq: Two times a day (BID) | ORAL | 1 refills | Status: DC

## 2020-08-22 NOTE — Telephone Encounter (Signed)
Pt informed

## 2020-08-22 NOTE — Telephone Encounter (Signed)
Please review

## 2020-08-22 NOTE — Telephone Encounter (Signed)
If she can increase metformin 500 mg tablets to 2 tablets twice daily to try to counteract the weight gain associated with olanzapine.  If that is not effective there is a new alternative that we can discuss at her next appointment.

## 2020-08-22 NOTE — Telephone Encounter (Signed)
Next visit is 09/17/20. Patient called and wants to know if she can increase her Metformin 500 mg to make it 2000 mg. She is currently taking 1500 mg. She said that she still doesn't feel better and wants to see if the extra 500 mg makes an improvement. Her phone number is [phone]. Her pharmacy is:  [id number] - [place], [place] [address] Phone:  [phone]  Fax:  [phone]

## 2020-08-23 ENCOUNTER — Other Ambulatory Visit: Payer: Self-pay | Admitting: Psychiatry

## 2020-08-23 DIAGNOSIS — R635 Abnormal weight gain: Secondary | ICD-10-CM

## 2020-08-25 ENCOUNTER — Other Ambulatory Visit: Payer: Self-pay | Admitting: Psychiatry

## 2020-09-17 ENCOUNTER — Encounter: Payer: Self-pay | Admitting: Psychiatry

## 2020-09-17 ENCOUNTER — Other Ambulatory Visit: Payer: Self-pay

## 2020-09-17 ENCOUNTER — Ambulatory Visit (INDEPENDENT_AMBULATORY_CARE_PROVIDER_SITE_OTHER): Payer: BC Managed Care – PPO | Admitting: Psychiatry

## 2020-09-17 VITALS — Ht 63.0 in | Wt 123.8 lb

## 2020-09-17 DIAGNOSIS — F5105 Insomnia due to other mental disorder: Secondary | ICD-10-CM | POA: Diagnosis not present

## 2020-09-17 DIAGNOSIS — R635 Abnormal weight gain: Secondary | ICD-10-CM

## 2020-09-17 DIAGNOSIS — F339 Major depressive disorder, recurrent, unspecified: Secondary | ICD-10-CM

## 2020-09-17 DIAGNOSIS — R7989 Other specified abnormal findings of blood chemistry: Secondary | ICD-10-CM

## 2020-09-17 DIAGNOSIS — F411 Generalized anxiety disorder: Secondary | ICD-10-CM | POA: Diagnosis not present

## 2020-09-17 MED ORDER — BELSOMRA 20 MG PO TABS: 1.0000 | ORAL_TABLET | Freq: Every day | ORAL | 5 refills | Status: DC

## 2020-09-17 MED ORDER — ALPRAZOLAM 1 MG PO TABS: ORAL_TABLET | ORAL | 4 refills | Status: DC

## 2020-09-17 MED ORDER — LITHIUM CARBONATE 150 MG PO CAPS: 450.0000 mg | ORAL_CAPSULE | Freq: Every evening | ORAL | 1 refills | Status: DC

## 2020-09-17 NOTE — Progress Notes (Signed)
[name] [id number] Jun 01, 1965 55 y.o.     Subjective:   Patient ID:  [name] is a 55 y.o. (DOB 1965/11/13) female.  Chief Complaint:  No chief complaint on file.   Depression        Associated symptoms include decreased concentration.  Associated symptoms include no fatigue, no appetite change and no suicidal ideas. Medication Refill Pertinent negatives include no abdominal pain, fatigue or weakness.   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  At visit March 2020.  She had completed [place] early December.  "It worked"  and had resolution of her depression!  She started Wellbutrin XL 300 mg daily for relapse prevention.  She continued olanzapine 2.5 mg every afternoon because her anxiety relapsed when she would try to completely discontinue it.  Stopped lithium DT SE and felt worse and restarted 300 daily.   In may 2020 pt taking lithium 600 mg and CO balance problems.  She stopped lithium and lithium level at 62 hours later was 0.4, suggesting possible prior toxicity so the lithium was reduced to 300 mg.    in June.  Because Wellbutrin had affected her sleep we switched it to Wellbutrin SR 200 mg every morning and encouraged her to reduce caffeine.  She had felt well but Wellbutrin XL 300 mg made her edgy and caused some insomnia.  She was also encouraged to take NAC as a supplement daily.  She called back September 14 stating that she wanted to switch back to Wellbutrin XL 300 mg because she felt that SR 200 mg was not working sufficiently.  She was cautioned about it contributing to insomnia because there was a concern and had done that in the past.  At visit January 15, 2019 the following was noted: Started a new job temporarily which didn't work.  Was so nervous going and needed 1/2 Xanax to go to work.  Couldn't meed the schedule demands Still has balance issues and  memory. She reduced the lithium from 300 to 150 mg daily bc she doesn't like taking a lot of meds and husband doesn't either.  She made the change on her own. Restarted [place] July August 2020 and felt much better.  Seeing [name] at [facility].  .  Mostly resolved with this tx and finished end of August.  Was good for a month and then the depression returned.  Kind of avoiding people, not severe.  But had periods of "horrific suicidal thoughts" though comments to safety.   Siister died 12-29-22 COPD and was close to her.   visit Jan 15, 2019 and increased lithium to 450, suggested B12 for energy.  seen March 27, 2019.  The following was noted: Anxiety is helped with 2.5 mg Zyprexa daily.  Option increase nefazodone again.  Some compliance issues. She doesn't like taking so much meds. incr Wellbutrin XL 450 mg AM if tolerated.  Disc SE including more anxiety, insomnia. Trial B12 to see if energy is better in the morning.  Her SI is gone after increasing lithium to 450 mg for SI and depression.  Continue this She continues sleep meds including Belsomra 20 mg nightly Requested lithium level  Last seen May 08, 2019.  The following was noted Increased Wellbutrin 450 too much jitteriness.  So reduced to 300 mg. Terrible STM. Loses track of thoughts in the middle of sentences.  Poor penmanship comes and goes. Sometimes when walks right  foot sticks to the ground with one fall.   So tired.   SI resolved overnight with increased lithium.  Still having problems with balance.  Some word-finding issues and anxiety.  Energy is better.  Sleep is good at this time. Some benefit with incr Wellbutrin.  Exhausted.  At times has to lay down.  Hard to get up and go work out in the morning.  Lower nefazadone 200 mg did not help her energy and her sleep was worse.     But still tired.  During [place] no depression and no SI but still tired.   Depression has recurred to a moderate level anxiety is still moderate.  Stays busy and active.  Exercising still.  Lost weight with Wellbutrin.  Jobless not good. Anxious at times..  But kids around.  Function is ok.  Doesn't eat much protein.  Eats peanut butter, no eggs, no meat.   Sleeping with Belsomra and Xanax. 9-10 hours CO dry mouth and cavities.  Metal taste in her mouth.  Easily confused.   No meds were changed.  By early 2021 nefazodone was no longer manufactured and then became unavailable to her abruptly.  She called July 11, 2019 and the 2 best options were felt to be either switch to higher dose trazodone though it is probably not as effective as an antidepressant as is nefazodone or the other option Viibryd.  She chose to switch to trazodone because when she discontinued nefazodone her initial chief complaint was insomnia.  07/17/2019 appointment the following is reported:  Hit a wall with abrupt stopping of nefazodone.  Still feels wired. Up at night cleaning.  No wreckless impulsivity.  Head is fuzzy.   Started trazodone 100 mg is helping some. Last 2 nights forgot meds.  Sleep fair with trazodone.  No SE.   Felt wired off nefazodone Down to 1/2 Xanax for 5 days or longer.  Wants to stop it. Don't feel depressed. Lost appetite off nefazodone and losing weight. Recommendation was to increase trazodone gradually to 300 mg nightly if tolerated to help compensate for the absence of nefazodone and hopes of helping depression.  07/30/2019 phone call complaining of muscle weakness. 08/30/2019 phone call asking to increase Wellbutrin from 300 to 450 mg daily.  She had tried this in the past but it made her jittery but it was agreed she could retry it.  09/17/2019 appointment with the following noted: Overall doing well.  Increase trazodone to 300 mg HS and still can't sleep well at times. Feels better with higher dose Wellbutrin and it didn't seem to worsen  Sleep. Tolerating changes except tremor. No taste for coffee any more.  No smoking in a year.   Wellbutrin helped.  Pilar Plate and kids good.   Better energy and less fatigue.  Less depression. Chronic sleep issues.  No anxiety.  Appetite OK.  Just don't eat like most people but does get protein.  Took a month off nefazodone to adjust.   Plan: Trial of Dayvigo in place of Belsomra at 5-10 mg HS. if it works better call back and let us know and we will change the prescription.  12/20/2019 appt with the following noted: Dayvigo not covered by insurance and made her a little dizzy in AM. Overall satisfied with Belsomra. CO metal taste in her mouth for a long time. CO forgetful and loses train of thought.  Wonders if related to psych treatment She's reduced trzodone to 100 mg HS.  High doses made her  dizzy. No depression.  Boys had Covid. She and H not vaccinated. Plan: No med changes  03/17/2020 appointment with the following noted: Disc recent accidental OD Wellbutrin with doubling dose.  It was horrible on 900 mg Wellbutrin.    Now only taking 300 mg Wellbutrin but H worries.  Better at 450 mg but is relatively OK.  Not sig depressed.  Satisfied for now. 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 Weaned off trazodone and is OK.  Sleep is inconsistent and then tired if that happens.  Asked questions about psychadelics in news for info for mental health problems.  Some anxiety lately. Plan:   No med changes  06/17/2020 appointment with following noted: Went back to Wellbutrin 450 mid January to help depression. No SE Not depressed. Li SE taste pennies comes and goes. Tried again to slowly taper off olanzapine and relapsed again and back on 2.5 mg HS and is better.  Trouble with weight in middle of gut. Lysine helped tongue soreness.   Never got Covid.  Despite family got it.  09/17/20 appt note: No weight loss with metformin. 123.8# today and 5'3" Mental health is good.  Rel with H good. Questions with meds. Trouble with initial and terminal but mostly awakening.  Not making her anxious. No  significant depression or suicidal thoughts since she was here.  The anxiety is manageable with the olanzapine.  Having some nocturia.  Long history of severe treatment resistant depression having failed multiple medications including imipramine,  paroxeine , nefazodone,   Viibryd, Prozac, venlafaxine, Emsam 9 mg, Wellbutrin 450, Pristiq, , mirtazapine nefazodone 600 sedation, duloxetine 90 mg, Trintellix, lithium 600, lamotrigine ? Response, Deplin, TMS, Latuda, lithium, olanzapine, quetiapine,  Rexulti caused NMS, Abilify, Vyvanse,  pramipexole,  ropinirole,  ProSom, Belsomra,  Vyvanse,  Provigil,  temazepam, Xanax, doxepin, Lunesta, gabapentin, Zofran, and she did respond to ECT.,   NAC  Review of Systems:  Review of Systems  Constitutional:  Positive for unexpected weight change. Negative for appetite change and fatigue.  HENT:  Negative for dental problem.   Gastrointestinal:  Positive for constipation. Negative for abdominal pain.  Neurological:  Negative for tremors and weakness.  Psychiatric/Behavioral:  Positive for decreased concentration and depression. Negative for agitation, behavioral problems, confusion, dysphoric mood, hallucinations, self-injury, sleep disturbance and suicidal ideas. The patient is nervous/anxious. The patient is not hyperactive.    Medications: I have reviewed the patient's current medications.  Current Outpatient Medications  Medication Sig Dispense Refill   buPROPion (WELLBUTRIN XL) 150 MG 24 hr tablet TAKE 1 TABLET BY MOUTH EVERY DAY WITH 300MG  TABLET TO EQUAL 450MG  90 tablet 1   buPROPion (WELLBUTRIN XL) 300 MG 24 hr tablet TAKE 1 TABLET BY MOUTH EVERY DAY 90 tablet 1   Cholecalciferol (VITAMIN D3) 50 MCG (2000 UT) TABS Take 2,000 Units by mouth daily with breakfast.     Cyanocobalamin (VITAMIN B 12 PO) Take 1 tablet by mouth daily with breakfast.      ALPRAZolam (XANAX) 1 MG tablet TAKE 1 TABLET BY MOUTH 3 TIMES A DAY AS NEEDED FOR ANXIETY 30 tablet  4   lithium carbonate 150 MG capsule Take 3 capsules (450 mg total) by mouth every evening. 270 capsule 1   omeprazole (PRILOSEC OTC) 20 MG tablet Take 20 mg by mouth at bedtime.     Suvorexant (BELSOMRA) 20 MG TABS Take 1 tablet by mouth at bedtime. 30 tablet 5   No current facility-administered medications for this visit.    Medication Side  Effects: None  Allergies: No Known Allergies  Past Medical History:  Diagnosis Date   Abdominal pain    Anal itching    Breast inflammation    Breast nodule    left   Chronic kidney disease    Cystocele    Decreased libido    Depression    Fibroadenoma    Galactorrhea    Mastodynia    Pelvic pain in female    Pelvic relaxation    Rectocele    Serotonin syndrome     Family History  Problem Relation Age of Onset   Heart disease Mother        angioplasty   Cancer Father        prostate   Hypertension Father    Depression Father    Bipolar disorder Father    Rheum arthritis Sister    Rheum arthritis Brother    Hypertension Maternal Grandmother    Diabetes Maternal Grandmother        borderline   Hypertension Paternal Grandmother    Arthritis Paternal Grandmother    Stroke Paternal Grandmother    Rheum arthritis Paternal Grandfather    Breast cancer Neg Hx    FHx: sister on olanzapine 5mg  and fluoxetine.  Social History   Socioeconomic History   Marital status: Married    Spouse name: Not on file   Number of children: 3   Years of education: College   Highest education level: Not on file  Occupational History   Occupation: Electrical engineer    Comment: Futures trader at [employer] Use   Smoking status: Every Day    Packs/day: 0.50    Pack years: 0.00    Types: Cigarettes   Smokeless tobacco: Never  Vaping Use   Vaping Use: Never used  Substance and Sexual Activity   Alcohol use: Yes    Alcohol/week: 1.0 standard drink    Types: 1 Glasses of wine per week    Comment: one drink about once a month   Drug  use: No   Sexual activity: Yes    Birth control/protection: Other-see comments    Comment: pt had hyst  Other Topics Concern   Not on file  Social History Narrative   Not on file   Social Determinants of Health   Financial Resource Strain: Not on file  Food Insecurity: Not on file  Transportation Needs: Not on file  Physical Activity: Not on file  Stress: Not on file  Social Connections: Not on file  Intimate Partner Violence: Not on file    Past Medical History, Surgical history, Social history, and Family history were reviewed and updated as appropriate.   Please see review of systems for further details on the patient's review from today.   Objective:   Physical Exam:  Ht 5\' 3"  (1.6 m)   Wt 123 lb 12.8 oz (56.2 kg)   LMP 05/20/2010   BMI 21.93 kg/m   Physical Exam Constitutional:      General: She is not in acute distress.    Appearance: She is well-developed.  Musculoskeletal:        General: No deformity.  Neurological:     Mental Status: She is alert and oriented to person, place, and time.     Cranial Nerves: No dysarthria.     Coordination: Coordination normal.  Psychiatric:        Attention and Perception: Attention and perception normal. She does not perceive auditory or visual hallucinations.  Mood and Affect: Mood is anxious. Mood is not depressed. Affect is not labile, blunt, angry, tearful or inappropriate.        Speech: Speech normal.        Behavior: Behavior normal. Behavior is cooperative.        Thought Content: Thought content normal. Thought content is not paranoid or delusional. Thought content does not include homicidal or suicidal ideation. Thought content does not include homicidal or suicidal plan.        Cognition and Memory: Cognition and memory normal.        Judgment: Judgment normal.     Comments: Insight intact SI resolved. Dep resolved at present    Lab Review:     Component Value Date/Time   NA 140 03/01/2020 1731    K 3.8 03/01/2020 1731   CL 102 03/01/2020 1731   CO2 25 03/01/2020 1731   GLUCOSE 92 03/01/2020 1731   BUN 9 03/01/2020 1731   CREATININE 1.17 (H) 03/01/2020 1731   CALCIUM 10.0 03/01/2020 1731   PROT 6.8 03/01/2020 1731   ALBUMIN 4.5 03/01/2020 1731   AST 28 03/01/2020 1731   ALT 22 03/01/2020 1731   ALKPHOS 68 03/01/2020 1731   BILITOT 0.8 03/01/2020 1731   GFRNONAA 55 (L) 03/01/2020 1731   GFRAA >60 10/10/2015 1115       Component Value Date/Time   WBC 7.3 03/01/2020 1731   RBC 5.12 (H) 03/01/2020 1731   HGB 16.5 (H) 03/01/2020 1731   HCT 50.9 (H) 03/01/2020 1731   PLT 211 03/01/2020 1731   MCV 99.4 03/01/2020 1731   MCH 32.2 03/01/2020 1731   MCHC 32.4 03/01/2020 1731   RDW 11.9 03/01/2020 1731   LYMPHSABS 1.2 03/01/2020 1731   MONOABS 0.4 03/01/2020 1731   EOSABS 0.1 03/01/2020 1731   BASOSABS 0.1 03/01/2020 1731    Lithium Lvl  Date Value Ref Range Status  03/01/2020 0.41 (L) 0.60 - 1.20 mmol/L Final    Comment:    Performed at [facility], [place] [address], [place], [place] [address]     No results found for: PHENYTOIN, PHENOBARB, VALPROATE, CBMZ   Lithium level received April 27, 2018 was 0.7.  This is probably adequate to help reduce the risk of relapse of depression post [place].  Therefore no med changes.    Vitamin D level April 27, 2018 on supplement was 60 and adequate. .res Assessment: Plan:    Diagnoses and all orders for this visit:  Weight gain due to medication  Insomnia due to mental condition -     Suvorexant (BELSOMRA) 20 MG TABS; Take 1 tablet by mouth at bedtime. -     ALPRAZolam (XANAX) 1 MG tablet; TAKE 1 TABLET BY MOUTH 3 TIMES A DAY AS NEEDED FOR ANXIETY  Recurrent major depression resistant to treatment (HCC) -     lithium carbonate 150 MG capsule; Take 3 capsules (450 mg total) by mouth every evening. -     Lithium level  Generalized anxiety disorder  Low vitamin D level   Greater than 50% of 25 min non face to face  time with patient was spent on counseling and coordination of care. 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  Reviewed multiple failed medications noted above.  She failed the attempt to wean Zyprexa because of worsening anxiety.  Anxiety is helped with 2.5 mg Zyprexa daily. Switch to Lybalvi 5 mg bc failure of metformin bc still gaining weight.  The increased dose may also  help with sleep.  Anxiety is manageable at present.  The increase in Wellbutrin back to 450 mg daily has led to better control of depression as well.  She is no longer jittery but tolerating it and find it's benefits are worth the shakiness. Overall depression is better controlled that it has been in some time.  She still has some sleep problems despite several meds to help with sleep.  Defer per her request NAC 600 mg or more daily.  She feels her protein intake is adequate and her weight is stable.  continue Belsomra at 20 mg HS.   Metformin wean DT over 3 weeks  Energy is better and no depression.  B12 seemed to help.   Option Aricept DT memory concerns.  She doesn't want more meds.  Her SI is gone after increasing lithium to 450 mg for SI and depression.  Depression resolved.  Discussed potential metabolic side effects associated with atypical antipsychotics, as well as potential risk for movement side effects. Advised pt to contact office if movement side effects occur.   She felt 50% better with last [place] but 100% better with first round [place].  Counseled patient regarding potential benefits, risks, and side effects of lithium to include potential risk of lithium affecting thyroid and renal function.  Discussed need for periodic lab monitoring to determine drug level and to assess for potential adverse effects.  Counseled patient regarding signs and symptoms of lithium toxicity and advised that they notify office immediately or seek urgent medical attention if experiencing these signs and symptoms.  Patient advised to contact  office with any questions or concerns.  Disc lithium and toxicity. Switch lithium to morning to eliminate nocturia. Check lithium level  See PCP about balance px.  May need neuro consult.  Vitamin D 5000 units daily  FU 3 mos  Lynder Parents, MD, DFAPA    Please see After Visit Summary for patient specific instructions.  Future Appointments  Date Time Provider [place]  12/22/2020  1:00 PM Cottle, Billey Co., MD CP-CP None    Orders Placed This Encounter  Procedures   Lithium level      Lynder Parents, MD, DFAPA   -------------------------------

## 2020-09-19 ENCOUNTER — Telehealth: Payer: Self-pay | Admitting: Psychiatry

## 2020-09-19 ENCOUNTER — Other Ambulatory Visit: Payer: Self-pay | Admitting: Psychiatry

## 2020-09-19 ENCOUNTER — Other Ambulatory Visit: Payer: Self-pay

## 2020-09-19 MED ORDER — ONDANSETRON HCL 8 MG PO TABS: 8.0000 mg | ORAL_TABLET | Freq: Three times a day (TID) | ORAL | 0 refills | Status: DC | PRN

## 2020-09-19 NOTE — Telephone Encounter (Signed)
She has olanzapine.  Switch back to olanzapine until the nausea resolves.  Then restart Lybalvi at 1/2 tablet nightly.  This should help the nausea.  Please make sure she does not abruptly stop the metformin but rather reduces it by 1 tablet/week until she is off of it.

## 2020-09-19 NOTE — Telephone Encounter (Signed)
Patient lm 2nd msg requesting a call from [facility] concerning medication reaction due to feel comfortable speaking with her.

## 2020-09-19 NOTE — Telephone Encounter (Signed)
Patient lm stating she was prescribed new medication on her office visit on Wednesday. She states she experiencing vomiting and dizziness. Please advise. # [id number] [phone]

## 2020-09-19 NOTE — Telephone Encounter (Signed)
Rtc to pt and she reports she is also decreasing Metformin as advised. She reports having a bad headache, she took Motrin but vomited it up. Asked if she had anything to take for nausea and she does not. Will check with Dr. Clovis Pu about sending Zofran in to help alleviate her nausea.

## 2020-09-19 NOTE — Telephone Encounter (Signed)
Pt is aware of adjusting. She will call back if symptoms continue or worsen.

## 2020-09-19 NOTE — Telephone Encounter (Signed)
Zofran prescription sent to [facility]

## 2020-09-19 NOTE — Telephone Encounter (Signed)
Rtc to pt and she reports starting Lyvalvi Wednesday night, she vomited twice on Thursday and was nauseous all day. She did not take anything last night, just feels nauseous today.  Asking if she should 1/2 the dose?   Informed her I would check with Dr. Clovis Pu and give her a call back

## 2020-09-26 ENCOUNTER — Other Ambulatory Visit: Payer: Self-pay | Admitting: Psychiatry

## 2020-09-26 DIAGNOSIS — R635 Abnormal weight gain: Secondary | ICD-10-CM

## 2020-09-26 DIAGNOSIS — T50905A Adverse effect of unspecified drugs, medicaments and biological substances, initial encounter: Secondary | ICD-10-CM

## 2020-10-14 ENCOUNTER — Other Ambulatory Visit: Payer: Self-pay | Admitting: Psychiatry

## 2020-10-14 ENCOUNTER — Telehealth: Payer: Self-pay | Admitting: Psychiatry

## 2020-10-14 DIAGNOSIS — F339 Major depressive disorder, recurrent, unspecified: Secondary | ICD-10-CM

## 2020-10-14 DIAGNOSIS — R569 Unspecified convulsions: Secondary | ICD-10-CM

## 2020-10-14 NOTE — Telephone Encounter (Signed)
FYI

## 2020-10-14 NOTE — Telephone Encounter (Signed)
TC to pt noted.  Wellbutrin reduced to 300 mg daily until complete neuro work up.

## 2020-10-14 NOTE — Telephone Encounter (Signed)
RTC  Witnessed apparent SZ movements of mouth and eyes rolled back. Lasted 2-3 mins but then confused with no memory of the event. Denies overtaking Wellbutrin.  No loss of bowel or bladder control. No new OTCs.    Neuro appt 11/04/20.  Labs and head CT reviewed from ER and were normal.  Drop the dose Wellbutrin to 300 mg daily.  She's been doing this since July 1.  Dx aparent sz of unknown origin.  No recent med changes and normal lab work up.  Lynder Parents, MD, DFAPA

## 2020-10-14 NOTE — Telephone Encounter (Signed)
[name] called to inform that she had a seizure on 10/02/20 for 2-3 minutes and then passed out. She went to the ER to get checked out and had no other seizures after this one. She said she is doing ok. Her PCP is trying to set her up with a neurologist. . Her phone number is [phone]. Her PCP wanted her to make Dr. Clovis Pu aware of the incident.

## 2020-10-14 NOTE — Progress Notes (Signed)
See not elsewhere.  Will check bupropion level.

## 2020-10-27 ENCOUNTER — Ambulatory Visit: Payer: BC Managed Care – PPO | Admitting: Neurology

## 2020-10-27 ENCOUNTER — Other Ambulatory Visit: Payer: Self-pay | Admitting: *Deleted

## 2020-10-27 ENCOUNTER — Encounter: Payer: Self-pay | Admitting: *Deleted

## 2020-10-27 ENCOUNTER — Encounter: Payer: Self-pay | Admitting: Neurology

## 2020-11-04 ENCOUNTER — Ambulatory Visit: Payer: BC Managed Care – PPO | Admitting: Neurology

## 2020-11-10 ENCOUNTER — Telehealth: Payer: Self-pay | Admitting: Psychiatry

## 2020-11-10 NOTE — Telephone Encounter (Signed)
Pt called and said that the lybavlvi 5 mg that she has started is making her gain weight. She has gone back to the regular zyprexa. Please give her a call at [phone]

## 2020-11-10 NOTE — Telephone Encounter (Signed)
That's odd bc Lybalvi is Zyprexa plus an appetite suppressant.  No way that should more likely to have weight gain with Lybalvi than olanzapine.  The issue is likely that she increased the olanzapine dose with Lybalvi but cut the tablet leading to less of the appetite suppressant than she would have otherwise.  Lybalvi is only available down to 5 mg olanzapine.  She could consider retrying Lybalvi 5 mg size at half dose if necessary or the whole tablet would give her tthe best appettie suppressant effeect.

## 2020-11-10 NOTE — Telephone Encounter (Signed)
Please review

## 2020-11-11 NOTE — Telephone Encounter (Signed)
Please clarify the med changes she has made.  Historically she was on olanzapine 5 mg tablets one half nightly and then we switched to Lybalvi 5 mg tablet 1 nightly.  Sot this wwould be an increase in the amount of olanzapine which can cause weight gain.  Is that what she was taking?  If so it was conceivable she might have weight gain with Lybalvi because it was an increase in dosage of olanzapine.  If she was taking half of the Lybalvi then weight gain does not make any sense because Lybalvi is just olanzapine with an appetite suppressant in it.  If she was splitting the Lybalvi then she was reducing the appetite supppressant dose and it might have failed to prevent weight gain for that reason.  She can take whichever medicine she prefers.

## 2020-11-12 ENCOUNTER — Ambulatory Visit: Payer: BC Managed Care – PPO | Admitting: Diagnostic Neuroimaging

## 2020-11-12 NOTE — Telephone Encounter (Signed)
Left pt msg to call back to discuss

## 2020-11-12 NOTE — Telephone Encounter (Signed)
Pt called back and she reports she is back to taking her Olanzapine 2.5 mg nightly. When she started Lybalvi she reports getting the 10 mg and it made her sick then Dr. Clovis Pu instructed her to cut in 1/2 to 5 mg but on that dose she gained weight. She did read that it could happen.  She is requesting something to help with her weight with staying on the 2.5 mg Olanzapine. She reports Dr. Clovis Pu knows what she struggles with.   Informed her I would update Dr. Clovis Pu and call back tomorrow.

## 2020-11-24 ENCOUNTER — Telehealth: Payer: Self-pay | Admitting: Psychiatry

## 2020-11-24 ENCOUNTER — Other Ambulatory Visit: Payer: Self-pay | Admitting: Psychiatry

## 2020-11-24 MED ORDER — DAYVIGO 10 MG PO TABS: 1.0000 | ORAL_TABLET | Freq: Every evening | ORAL | 0 refills | Status: DC

## 2020-11-24 NOTE — Telephone Encounter (Signed)
Pt called and said that she is not able to sleep. She just tosses and turns and is up and down. Please give her a call at [phone]

## 2020-11-24 NOTE — Telephone Encounter (Signed)
We tried to substitute Dayvigo last year but it was not covered by her insurance company.  It may be covered this year.  Tell her to download the discount card from the Internet.  Unfortunately we do not have samples right now.  I will send in a prescription.

## 2020-11-24 NOTE — Telephone Encounter (Signed)
Pt takes belsomra 20 mg and its no longer working for sleep.She wakes up and can't go back to sleep.She wants to know what can be done to help.

## 2020-11-24 NOTE — Telephone Encounter (Signed)
Pt informed

## 2020-12-01 NOTE — Telephone Encounter (Signed)
Rtc to pt.She stated she will discuss at her appt,she does not want to take lybalvi.However she did not pick up dayvigo for sleep and she did not get the discount card as instructed.She said she will just wait until she see's you.

## 2020-12-01 NOTE — Telephone Encounter (Signed)
F/U: pt requesting something for her weight issues. Anything recommended? Next apt 9/19

## 2020-12-01 NOTE — Telephone Encounter (Signed)
The only thing we could do is try again the Lybalvi but give her 5 mg samples.  Not sure why she had 10 mg samples before.  With the 5 mg and taking a WHOLE tablet she would get the full dose of the appetite suppressant.  If she cuts it she will not cut the full dose of the appetite suppressant.

## 2020-12-04 ENCOUNTER — Telehealth: Payer: Self-pay | Admitting: Neurology

## 2020-12-04 ENCOUNTER — Ambulatory Visit: Payer: BC Managed Care – PPO | Admitting: Neurology

## 2020-12-04 ENCOUNTER — Encounter: Payer: Self-pay | Admitting: Neurology

## 2020-12-04 VITALS — BP 135/88 | HR 76 | Ht 63.0 in | Wt 129.0 lb

## 2020-12-04 DIAGNOSIS — G40909 Epilepsy, unspecified, not intractable, without status epilepticus: Secondary | ICD-10-CM

## 2020-12-04 NOTE — Telephone Encounter (Signed)
EEG order sent to [facility], they will reach out to patient to schedule.

## 2020-12-04 NOTE — Progress Notes (Signed)
[facility]  PATIENT: [name] DOB: Oct 05, 1965  REFERRING CLINICIAN: Joya Gaskins, * HISTORY FROM: Patient  REASON FOR VISIT: Seizure   HISTORICAL  CHIEF COMPLAINT:  Chief Complaint  Patient presents with   New Patient (Initial Visit)    RM 12, alone, here to discuss seizure like activity, states she is well, concerned that it may be affecting memory     HISTORY OF PRESENT ILLNESS:  This is a 55 year old woman with past medical history of generalized anxiety disorder, major depression requiring ECT's who is presenting after seizure.  Patient said she was out of town visiting family member and on June 30 she had a seizure.  She reports being at her mother-in-law's house and the last thing she remembers is telling her husband that they need to go visit her own mother and next thing she knows is waking up from the floor and sister-in-law who is a nurse told her that she just had a seizure.  Sister-in-law describes activity as tonic-clonic, with tongue biting, no urinary or bowel incontinence.  Patient was taken to a local hospital where she had a head CT which was normal.  She was back to baseline and discharged home with close follow-up. Patient states he had a long history of major depression, currently she is on Wellbutrin 450 mg, has been on this dose for a long time.  She also had ECTs 6 years ago reports having about 10 rounds and also [place] about 3 times.  She reported currently that her memory is poor, her short memory is terrible, she has trouble spelling sometimes even writing.  Family members including children have complained about her memory.  She denies being loss driving in familiar places, denies having any issue with her finances; her main complaint is sometimes she is forgetful and has trouble with spelling and writing. One additional thing the patient noticed that for the past year she has been getting carsick but since the event on June  30 no more episodes of carsickness.   Handedness: Right handed   Seizure Type: generalized tonic-clonic  Current frequency: once   Any injuries from seizures: None   Seizure risk factors: no family history of seizures, no febrile seizures, no other seizure risks factor   Previous ASMs: None  Currenty ASMs: None  ASMs side effects: None   Brain Images: Head CT [name]: 2 no acute intracranial abnormality  Previous EEGs: None   OTHER MEDICAL CONDITIONS: Generalized anxiety disorder, major depression  REVIEW OF SYSTEMS: Full 14 system review of systems performed and negative with exception of: as noted in the HPI  ALLERGIES: No Known Allergies  HOME MEDICATIONS: Outpatient Medications Prior to Visit  Medication Sig Dispense Refill   ALPRAZolam (XANAX) 1 MG tablet TAKE 1 TABLET BY MOUTH 3 TIMES A DAY AS NEEDED FOR ANXIETY 30 tablet 4   buPROPion (WELLBUTRIN XL) 150 MG 24 hr tablet TAKE 1 TABLET BY MOUTH EVERY DAY WITH '300MG'$  TABLET TO EQUAL '450MG'$  90 tablet 1   buPROPion (WELLBUTRIN XL) 300 MG 24 hr tablet TAKE 1 TABLET BY MOUTH EVERY DAY 90 tablet 1   Cholecalciferol (VITAMIN D3) 50 MCG (2000 UT) TABS Take 2,000 Units by mouth daily with breakfast.     Cyanocobalamin ER (B-12 DUAL SPECTRUM) 5000 MCG TBCR Take 1 tablet by mouth daily with breakfast.     Lemborexant (DAYVIGO) 10 MG TABS Take 1 tablet by mouth at bedtime. 30 tablet 0   lithium carbonate 150 MG  capsule Take 3 capsules (450 mg total) by mouth every evening. 270 capsule 1   OLANZapine (ZYPREXA) 5 MG tablet Take 2.5 mg by mouth at bedtime. Pt takes half a tab     ondansetron (ZOFRAN) 8 MG tablet Take 1 tablet (8 mg total) by mouth every 8 (eight) hours as needed for nausea or vomiting. 20 tablet 0   pantoprazole (PROTONIX) 40 MG tablet Take 40 mg by mouth daily.     No facility-administered medications prior to visit.    PAST MEDICAL HISTORY: Past Medical History:  Diagnosis Date   Abdominal pain    Anal itching     Anxiety    Breast inflammation    Breast nodule    left   Chronic kidney disease    Cystocele    Decreased libido    Depression    Diverticular disease    Eczema    Fibroadenoma    Galactorrhea    IBS (irritable bowel syndrome)    Mastodynia    Pelvic pain in female    Pelvic relaxation    Rectocele    Seizure-like activity (HCC)    Serotonin syndrome     PAST SURGICAL HISTORY: Past Surgical History:  Procedure Laterality Date   ABDOMINAL HYSTERECTOMY     partial   BREAST BIOPSY     HERNIA REPAIR     INCONTINENCE SURGERY     OVARIAN CYST REMOVAL     left    FAMILY HISTORY: Family History  Problem Relation Age of Onset   Heart disease Mother        angioplasty   Cancer Father        prostate   Hypertension Father    Depression Father    Bipolar disorder Father    Rheum arthritis Sister    Rheum arthritis Brother    Hypertension Maternal Grandmother    Diabetes Maternal Grandmother        borderline   Hypertension Paternal Grandmother    Arthritis Paternal Grandmother    Stroke Paternal Grandmother    Rheum arthritis Paternal Grandfather    Breast cancer Neg Hx     SOCIAL HISTORY: Social History   Socioeconomic History   Marital status: Married    Spouse name: Dub Mikes   Number of children: 3   Years of education: [employer] education level: Not on file  Occupational History   Occupation: Electrical engineer    Comment: Futures trader at [employer]  Tobacco Use   Smoking status: Every Day    Packs/day: 0.50    Types: Cigarettes   Smokeless tobacco: Never  Vaping Use   Vaping Use: Never used  Substance and Sexual Activity   Alcohol use: Yes    Alcohol/week: 1.0 standard drink    Types: 1 Glasses of wine per week    Comment: one drink about once a month   Drug use: No   Sexual activity: Yes    Birth control/protection: Other-see comments    Comment: pt had hyst  Other Topics Concern   Not on file  Social History Narrative   Lives  with husband   Social Determinants of Health   Financial Resource Strain: Not on file  Food Insecurity: Not on file  Transportation Needs: Not on file  Physical Activity: Not on file  Stress: Not on file  Social Connections: Not on file  Intimate Partner Violence: Not on file     PHYSICAL EXAM  GENERAL EXAM/CONSTITUTIONAL: Vitals:  Vitals:  12/04/20 1059  BP: 135/88  Pulse: 76  Weight: 129 lb (58.5 kg)  Height: '5\' 3"'$  (1.6 m)   Body mass index is 22.85 kg/m. Wt Readings from Last 3 Encounters:  12/04/20 129 lb (58.5 kg)  08/13/20 124 lb 4 oz (56.4 kg)  12/27/17 133 lb (60.3 kg)   Patient is in no distress; well developed, nourished and groomed; neck is supple  CARDIOVASCULAR: Examination of carotid arteries is normal; no carotid bruits Regular rate and rhythm, no murmurs Examination of peripheral vascular system by observation and palpation is normal  EYES: Pupils round and reactive to light, Visual fields full to confrontation, Extraocular movements intacts,   MUSCULOSKELETAL: Gait, strength, tone, movements noted in Neurologic exam below  NEUROLOGIC: MENTAL STATUS:  awake, alert, oriented to person, place and time recent and remote memory intact normal attention and concentration language fluent, comprehension intact, naming intact fund of knowledge appropriate Spell PENCIL backward as LIPEN, for serial 7, stopped at 93 and states that she couldn't continue  CRANIAL NERVE:  2nd, 3rd, 4th, 6th - pupils equal and reactive to light, visual fields full to confrontation, extraocular muscles intact, no nystagmus 5th - facial sensation symmetric 7th - facial strength symmetric 8th - hearing intact 9th - palate elevates symmetrically, uvula midline 11th - shoulder shrug symmetric 12th - tongue protrusion midline  MOTOR:  normal bulk and tone, full strength in the BUE, BLE  SENSORY:  normal and symmetric to light touch, pinprick, temperature,  vibration  COORDINATION:  finger-nose-finger, fine finger movements normal  REFLEXES:  deep tendon reflexes present and symmetric  GAIT/STATION:  normal   DIAGNOSTIC DATA (LABS, IMAGING, TESTING) - I reviewed patient records, labs, notes, testing and imaging myself where available.  Lab Results  Component Value Date   WBC 7.3 03/01/2020   HGB 16.5 (H) 03/01/2020   HCT 50.9 (H) 03/01/2020   MCV 99.4 03/01/2020   PLT 211 03/01/2020      Component Value Date/Time   NA 140 03/01/2020 1731   K 3.8 03/01/2020 1731   CL 102 03/01/2020 1731   CO2 25 03/01/2020 1731   GLUCOSE 92 03/01/2020 1731   BUN 9 03/01/2020 1731   CREATININE 1.17 (H) 03/01/2020 1731   CALCIUM 10.0 03/01/2020 1731   PROT 6.8 03/01/2020 1731   ALBUMIN 4.5 03/01/2020 1731   AST 28 03/01/2020 1731   ALT 22 03/01/2020 1731   ALKPHOS 68 03/01/2020 1731   BILITOT 0.8 03/01/2020 1731   GFRNONAA 55 (L) 03/01/2020 1731   GFRAA >60 10/10/2015 1115   Lab Results  Component Value Date   CHOL 186 10/02/2015   HDL 63 10/02/2015   LDLCALC 93 10/02/2015   TRIG 148 10/02/2015   No results found for: HGBA1C No results found for: VITAMINB12 Lab Results  Component Value Date   TSH 1.26 11/16/2017    Head CT 7/2: No acute intracranial abnormality  No imaging available for review.  ASSESSMENT AND PLAN  55 y.o. year old female  with past medical history of generalized anxiety disorder major depression on Wellbutrin 450 mg who is presenting after seizure.  Seizure described as tonic-clonic with tongue biting no urinary or bowel incontinence.  Patient denies any seizure risk factor and stated that this was her first lifetime seizure.  However she is on Wellbutrin which is known to decrease the seizure threshold.   Informed patient it is reasonable not to start medication at this time.  I also informed patient to contact me if  she has another seizure, at that time I will reach out to her psychiatrist to see if we can  switch Wellbutrin to a different medication and will start her on antiseizure medication. We will order a brain MRI with and without contrast and EEG for evaluation. I will contact her to discuss the MRI brain and her EEG result, otherwise follow-up in 26-month  Discussed about driving restriction   1. Seizure disorder ([place]       PLAN: MRI brain with and without contrast  EEG  Follow up in 3 months or sooner if worse   Per [facility], patients with seizures are not allowed to drive until they have been seizure-free for six months.  Other recommendations include using caution when using heavy equipment or power tools. Avoid working on ladders or at heights. Take showers instead of baths.  Do not swim alone.  Ensure the water temperature is not too high on the home water heater. Do not go swimming alone. Do not lock yourself in a room alone (i.e. bathroom). When caring for infants or small children, sit down when holding, feeding, or changing them to minimize risk of injury to the child in the event you have a seizure. Maintain good sleep hygiene. Avoid alcohol.  Also recommend adequate sleep, hydration, good diet and minimize stress.   During the Seizure  - First, ensure adequate ventilation and place patients on the floor on their left side  Loosen clothing around the neck and ensure the airway is patent. If the patient is clenching the teeth, do not force the mouth open with any object as this can cause severe damage - Remove all items from the surrounding that can be hazardous. The patient may be oblivious to what's happening and may not even know what he or she is doing. If the patient is confused and wandering, either gently guide him/her away and block access to outside areas - Reassure the individual and be comforting - Call 911. In most cases, the seizure ends before EMS arrives. However, there are cases when seizures may last over 3 to 5 minutes. Or the individual may  have developed breathing difficulties or severe injuries. If a pregnant patient or a person with diabetes develops a seizure, it is prudent to call an ambulance. - Finally, if the patient does not regain full consciousness, then call EMS. Most patients will remain confused for about 45 to 90 minutes after a seizure, so you must use judgment in calling for help. - Avoid restraints but make sure the patient is in a bed with padded side rails - Place the individual in a lateral position with the neck slightly flexed; this will help the saliva drain from the mouth and prevent the tongue from falling backward - Remove all nearby furniture and other hazards from the area - Provide verbal assurance as the individual is regaining consciousness - Provide the patient with privacy if possible - Call for help and start treatment as ordered by the caregiver   After the Seizure (Postictal Stage)  After a seizure, most patients experience confusion, fatigue, muscle pain and/or a headache. Thus, one should permit the individual to sleep. For the next few days, reassurance is essential. Being calm and helping reorient the person is also of importance.  Most seizures are painless and end spontaneously. Seizures are not harmful to others but can lead to complications such as stress on the lungs, brain and the heart. Individuals with prior lung problems may  develop labored breathing and respiratory distress.     Orders Placed This Encounter  Procedures   MR BRAIN W WO CONTRAST   EEG adult    No orders of the defined types were placed in this encounter.   Return in about 3 months (around 03/05/2021).    Alric Ran, MD 12/04/2020, 11:47 AM  [facility] [address], [place], [place] [address] [phone]

## 2020-12-04 NOTE — Patient Instructions (Addendum)
MRI brain with and without contrast  EEG  Follow up in 3 months or sooner if worse    Per [facility] statutes, patients with seizures are not allowed to drive until they have been seizure-free for six months.  Other recommendations include using caution when using heavy equipment or power tools. Avoid working on ladders or at heights. Take showers instead of baths.  Do not swim alone.  Ensure the water temperature is not too high on the home water heater. Do not go swimming alone. Do not lock yourself in a room alone (i.e. bathroom). When caring for infants or small children, sit down when holding, feeding, or changing them to minimize risk of injury to the child in the event you have a seizure. Maintain good sleep hygiene. Avoid alcohol.  Also recommend adequate sleep, hydration, good diet and minimize stress.   During the Seizure  - First, ensure adequate ventilation and place patients on the floor on their left side  Loosen clothing around the neck and ensure the airway is patent. If the patient is clenching the teeth, do not force the mouth open with any object as this can cause severe damage - Remove all items from the surrounding that can be hazardous. The patient may be oblivious to what's happening and may not even know what he or she is doing. If the patient is confused and wandering, either gently guide him/her away and block access to outside areas - Reassure the individual and be comforting - Call 911. In most cases, the seizure ends before EMS arrives. However, there are cases when seizures may last over 3 to 5 minutes. Or the individual may have developed breathing difficulties or severe injuries. If a pregnant patient or a person with diabetes develops a seizure, it is prudent to call an ambulance. - Finally, if the patient does not regain full consciousness, then call EMS. Most patients will remain confused for about 45 to 90 minutes after a seizure, so you must use judgment  in calling for help. - Avoid restraints but make sure the patient is in a bed with padded side rails - Place the individual in a lateral position with the neck slightly flexed; this will help the saliva drain from the mouth and prevent the tongue from falling backward - Remove all nearby furniture and other hazards from the area - Provide verbal assurance as the individual is regaining consciousness - Provide the patient with privacy if possible - Call for help and start treatment as ordered by the caregiver   After the Seizure (Postictal Stage)  After a seizure, most patients experience confusion, fatigue, muscle pain and/or a headache. Thus, one should permit the individual to sleep. For the next few days, reassurance is essential. Being calm and helping reorient the person is also of importance.  Most seizures are painless and end spontaneously. Seizures are not harmful to others but can lead to complications such as stress on the lungs, brain and the heart. Individuals with prior lung problems may develop labored breathing and respiratory distress.

## 2020-12-10 ENCOUNTER — Telehealth: Payer: Self-pay | Admitting: Neurology

## 2020-12-10 MED ORDER — ALPRAZOLAM 1 MG PO TABS: 1.0000 mg | ORAL_TABLET | Freq: Every evening | ORAL | 0 refills | Status: DC | PRN

## 2020-12-10 NOTE — Telephone Encounter (Signed)
Spoke with patient, ordered Xanax '1mg'$  (2 tab) to take before MRI.   Alric Ran, MD

## 2020-12-10 NOTE — Telephone Encounter (Signed)
MR Brain w/wo contrast Dr. Marin Roberts Josem Kaufmann: [id number] (exp. 12/10/20 to 01/08/21). Patient is scheduled at [facility] for 12/16/20.  Patient also informed me she is claustrophobic and would like something to help her.   She also stated about getting her EEG scheduled, but I don't see an order put in.

## 2020-12-11 NOTE — Telephone Encounter (Signed)
Noted, thank you

## 2020-12-16 ENCOUNTER — Ambulatory Visit: Payer: BC Managed Care – PPO

## 2020-12-16 DIAGNOSIS — G40909 Epilepsy, unspecified, not intractable, without status epilepticus: Secondary | ICD-10-CM | POA: Diagnosis not present

## 2020-12-16 MED ORDER — GADOBENATE DIMEGLUMINE 529 MG/ML IV SOLN
15.0000 mL | Freq: Once | INTRAVENOUS | Status: AC | PRN
  Administered 2020-12-16: 15 mL via INTRAVENOUS

## 2020-12-16 NOTE — Telephone Encounter (Signed)
Sent EEG order to [facility] for scheduling (pt's appointment at [facility] cancelled, they are not doing [facility] EEGs anymore)

## 2020-12-22 ENCOUNTER — Ambulatory Visit: Payer: BC Managed Care – PPO | Admitting: Psychiatry

## 2020-12-22 ENCOUNTER — Other Ambulatory Visit: Payer: Self-pay

## 2020-12-22 ENCOUNTER — Encounter: Payer: Self-pay | Admitting: Psychiatry

## 2020-12-22 DIAGNOSIS — F5105 Insomnia due to other mental disorder: Secondary | ICD-10-CM

## 2020-12-22 DIAGNOSIS — R635 Abnormal weight gain: Secondary | ICD-10-CM

## 2020-12-22 DIAGNOSIS — F339 Major depressive disorder, recurrent, unspecified: Secondary | ICD-10-CM | POA: Diagnosis not present

## 2020-12-22 DIAGNOSIS — R569 Unspecified convulsions: Secondary | ICD-10-CM

## 2020-12-22 DIAGNOSIS — F411 Generalized anxiety disorder: Secondary | ICD-10-CM

## 2020-12-22 DIAGNOSIS — R7989 Other specified abnormal findings of blood chemistry: Secondary | ICD-10-CM

## 2020-12-22 MED ORDER — QUVIVIQ 50 MG PO TABS: 50.0000 mg | ORAL_TABLET | Freq: Every evening | ORAL | 2 refills | Status: DC

## 2020-12-22 MED ORDER — PIOGLITAZONE HCL 15 MG PO TABS
15.0000 mg | ORAL_TABLET | Freq: Every day | ORAL | 0 refills | Status: DC
Start: 2020-12-22 — End: 2021-01-14

## 2020-12-22 NOTE — Progress Notes (Addendum)
DAFNEE BRODHEAD [id number] 1965/04/25 55 y.o.     Subjective:   Patient ID:  [name] is a 55 y.o. (DOB [birth year]) female.  Chief Complaint:  Chief Complaint  Patient presents with   Follow-up   Depression     Depression        Associated symptoms include decreased concentration.  Associated symptoms include no fatigue, no appetite change and no suicidal ideas. Medication Refill Pertinent negatives include no abdominal pain, fatigue or weakness.   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  At visit March 2020.  She had completed [place] early December.  "It worked"  and had resolution of her depression!  She started Wellbutrin XL 300 mg daily for relapse prevention.  She continued olanzapine 2.5 mg every afternoon because her anxiety relapsed when she would try to completely discontinue it.  Stopped lithium DT SE and felt worse and restarted 300 daily.   In may 2020 pt taking lithium 600 mg and CO balance problems.  She stopped lithium and lithium level at 62 hours later was 0.4, suggesting possible prior toxicity so the lithium was reduced to 300 mg.    in June.  Because Wellbutrin had affected her sleep we switched it to Wellbutrin SR 200 mg every morning and encouraged her to reduce caffeine.  She had felt well but Wellbutrin XL 300 mg made her edgy and caused some insomnia.  She was also encouraged to take NAC as a supplement daily.  She called back September 14 stating that she wanted to switch back to Wellbutrin XL 300 mg because she felt that SR 200 mg was not working sufficiently.  She was cautioned about it contributing to insomnia because there was a concern and had done that in the past.  At visit January 15, 2019 the following was noted: Started a new job temporarily which didn't work.  Was so nervous going and needed 1/2 Xanax to go to work.  Couldn't meed the schedule  demands Still has balance issues and memory. She reduced the lithium from 300 to 150 mg daily bc she doesn't like taking a lot of meds and husband doesn't either.  She made the change on her own. Restarted [place] July August 2020 and felt much better.  Seeing [name] at [facility].  .  Mostly resolved with this tx and finished end of August.  Was good for a month and then the depression returned.  Kind of avoiding people, not severe.  But had periods of "horrific suicidal thoughts" though comments to safety.   Siister died 12/25/22 COPD and was close to her.   visit Jan 15, 2019 and increased lithium to 450, suggested B12 for energy.  seen March 27, 2019.  The following was noted: Anxiety is helped with 2.5 mg Zyprexa daily.  Option increase nefazodone again.  Some compliance issues. She doesn't like taking so much meds. incr Wellbutrin XL 450 mg AM if tolerated.  Disc SE including more anxiety, insomnia. Trial B12 to see if energy is better in the morning.  Her SI is gone after increasing lithium to 450 mg for SI and depression.  Continue this She continues sleep meds including Belsomra 20 mg nightly Requested lithium level  Last seen May 08, 2019.  The following was noted Increased Wellbutrin 450 too much jitteriness.  So reduced to 300 mg. Terrible STM. Loses track of thoughts in the middle of sentences.  Poor penmanship comes and goes. Sometimes when walks right foot sticks to the ground with one fall.   So tired.   SI resolved overnight with increased lithium.  Still having problems with balance.  Some word-finding issues and anxiety.  Energy is better.  Sleep is good at this time. Some benefit with incr Wellbutrin.  Exhausted.  At times has to lay down.  Hard to get up and go work out in the morning.  Lower nefazadone 200 mg did not help her energy and her sleep was worse.     But still tired.  During [place] no depression and no SI but still tired.   Depression has recurred to a  moderate level anxiety is still moderate. Stays busy and active.  Exercising still.  Lost weight with Wellbutrin.  Jobless not good. Anxious at times..  But kids around.  Function is ok.  Doesn't eat much protein.  Eats peanut butter, no eggs, no meat.   Sleeping with Belsomra and Xanax. 9-10 hours CO dry mouth and cavities.  Metal taste in her mouth.  Easily confused.   No meds were changed.  By early 2021 nefazodone was no longer manufactured and then became unavailable to her abruptly.  She called July 11, 2019 and the 2 best options were felt to be either switch to higher dose trazodone though it is probably not as effective as an antidepressant as is nefazodone or the other option Viibryd.  She chose to switch to trazodone because when she discontinued nefazodone her initial chief complaint was insomnia.  07/17/2019 appointment the following is reported:  Hit a wall with abrupt stopping of nefazodone.  Still feels wired. Up at night cleaning.  No wreckless impulsivity.  Head is fuzzy.   Started trazodone 100 mg is helping some. Last 2 nights forgot meds.  Sleep fair with trazodone.  No SE.   Felt wired off nefazodone Down to 1/2 Xanax for 5 days or longer.  Wants to stop it. Don't feel depressed. Lost appetite off nefazodone and losing weight. Recommendation was to increase trazodone gradually to 300 mg nightly if tolerated to help compensate for the absence of nefazodone and hopes of helping depression.  07/30/2019 phone call complaining of muscle weakness. 08/30/2019 phone call asking to increase Wellbutrin from 300 to 450 mg daily.  She had tried this in the past but it made her jittery but it was agreed she could retry it.  09/17/2019 appointment with the following noted: Overall doing well.  Increase trazodone to 300 mg HS and still can't sleep well at times. Feels better with higher dose Wellbutrin and it didn't seem to worsen  Sleep. Tolerating changes except tremor. No taste for  coffee any more.  No smoking in a year.  Wellbutrin helped.  Pilar Plate and kids good.   Better energy and less fatigue.  Less depression. Chronic sleep issues.  No anxiety.  Appetite OK.  Just don't eat like most people but does get protein.  Took a month off nefazodone to adjust.   Plan: Trial of Dayvigo in place of Belsomra at 5-10 mg HS. if it works better call back and let us know and we will change the prescription.  12/20/2019 appt with the following noted: Dayvigo not covered by insurance and made her a little dizzy in AM. Overall satisfied with Belsomra. CO metal taste in her mouth for a long time. CO forgetful and loses train of thought.  Wonders if related to psych treatment She's reduced trzodone  to 100 mg HS.  High doses made her dizzy. No depression.  Boys had Covid. She and H not vaccinated. Plan: No med changes  03/17/2020 appointment with the following noted: Disc recent accidental OD Wellbutrin with doubling dose.  It was horrible on 900 mg Wellbutrin.    Now only taking 300 mg Wellbutrin but H worries.  Better at 450 mg but is relatively OK.  Not sig depressed.  Satisfied for now. 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 Weaned off trazodone and is OK.  Sleep is inconsistent and then tired if that happens.  Asked questions about psychadelics in news for info for mental health problems.  Some anxiety lately. Plan:   No med changes  06/17/2020 appointment with following noted: Went back to Wellbutrin 450 mid January to help depression. No SE Not depressed. Li SE taste pennies comes and goes. Tried again to slowly taper off olanzapine and relapsed again and back on 2.5 mg HS and is better.  Trouble with weight in middle of gut. Lysine helped tongue soreness.   Never got Covid.  Despite family got it.  09/17/20 appt note: No weight loss with metformin. 123.8# today and 5'3" Mental health is good.  Rel with H good. Questions with meds. Trouble with initial and terminal but mostly  awakening.  Not making her anxious. No significant depression or suicidal thoughts since she was here.  The anxiety is manageable with the olanzapine.  Having some nocturia. Plan: Switch lithium to morning to eliminate nocturia. Check lithium level Wean metformin due to the response Anxiety is helped with 2.5 mg Zyprexa daily. Switch to Lybalvi 5 mg bc failure of metformin bc still gaining weight.  The increased dose may also help with sleep.  Anxiety is manageable at present.   12/21/2020 appointment with the following noted,: Trouble with EFA. Dayvigo NR like Belsomra Patient reports stable mood and denies depressed or irritable moods.  Patient denies any recent difficulty with anxiety.   Denies appetite disturbance.  Patient reports that energy and motivation have been good.  Patient denies any difficulty with concentration.  Patient denies any suicidal ideation. Ongoing concerns about weight gain with olanzapine despite exercise and limiting calories.  Long history of severe treatment resistant depression having failed multiple medications including imipramine,  paroxeine , nefazodone,   Viibryd, Prozac, venlafaxine, Emsam 9 mg, Wellbutrin 450, Pristiq, , mirtazapine nefazodone 600 sedation, duloxetine 90 mg, Trintellix, lithium 600, lamotrigine ? Response, Deplin, TMS, Latuda, lithium, olanzapine, quetiapine,  Rexulti caused NMS, Abilify, Vyvanse,  pramipexole,  ropinirole,  ProSom, Belsomra,  Vyvanse,  Provigil,  temazepam, Xanax, doxepin, Lunesta, gabapentin, Zofran, and she did respond to ECT.,   NAC  Sister gained weight on olanazapine  Review of Systems:  Review of Systems  Constitutional:  Positive for unexpected weight change. Negative for appetite change and fatigue.  HENT:  Negative for dental problem.   Gastrointestinal:  Positive for constipation. Negative for abdominal pain.  Neurological:  Negative for tremors and weakness.  Psychiatric/Behavioral:  Positive for  decreased concentration. Negative for agitation, behavioral problems, confusion, dysphoric mood, hallucinations, self-injury, sleep disturbance and suicidal ideas. The patient is nervous/anxious. The patient is not hyperactive.    Medications: I have reviewed the patient's current medications.  Current Outpatient Medications  Medication Sig Dispense Refill   ALPRAZolam (XANAX) 1 MG tablet TAKE 1 TABLET BY MOUTH 3 TIMES A DAY AS NEEDED FOR ANXIETY 30 tablet 4   buPROPion (WELLBUTRIN XL) 300 MG 24 hr tablet TAKE 1 TABLET  BY MOUTH EVERY DAY 90 tablet 1   Cholecalciferol (VITAMIN D3) 50 MCG (2000 UT) TABS Take 2,000 Units by mouth daily with breakfast.     Cyanocobalamin ER (B-12 DUAL SPECTRUM) 5000 MCG TBCR Take 1 tablet by mouth daily with breakfast.     Daridorexant HCl (QUVIVIQ) 50 MG TABS Take 50 mg by mouth at bedtime. 30 tablet 2   lithium carbonate 150 MG capsule Take 3 capsules (450 mg total) by mouth every evening. (Patient taking differently: Take 300 mg by mouth every evening.) 270 capsule 1   OLANZapine (ZYPREXA) 5 MG tablet Take 2.5 mg by mouth at bedtime. Pt takes half a tab     pantoprazole (PROTONIX) 40 MG tablet Take 40 mg by mouth daily.     pioglitazone (ACTOS) 15 MG tablet Take 1 tablet (15 mg total) by mouth daily. 30 tablet 0   ondansetron (ZOFRAN) 8 MG tablet Take 1 tablet (8 mg total) by mouth every 8 (eight) hours as needed for nausea or vomiting. (Patient not taking: Reported on 12/22/2020) 20 tablet 0   No current facility-administered medications for this visit.    Medication Side Effects: None  Allergies: No Known Allergies  Past Medical History:  Diagnosis Date   Abdominal pain    Anal itching    Anxiety    Breast inflammation    Breast nodule    left   Chronic kidney disease    Cystocele    Decreased libido    Depression    Diverticular disease    Eczema    Fibroadenoma    Galactorrhea    IBS (irritable bowel syndrome)    Mastodynia    Pelvic pain  in female    Pelvic relaxation    Rectocele    Seizure-like activity (HCC)    Serotonin syndrome     Family History  Problem Relation Age of Onset   Heart disease Mother        angioplasty   Cancer Father        prostate   Hypertension Father    Depression Father    Bipolar disorder Father    Rheum arthritis Sister    Rheum arthritis Brother    Hypertension Maternal Grandmother    Diabetes Maternal Grandmother        borderline   Hypertension Paternal Grandmother    Arthritis Paternal Grandmother    Stroke Paternal Grandmother    Rheum arthritis Paternal Grandfather    Breast cancer Neg Hx    FHx: sister on olanzapine '5mg'$  and fluoxetine.  Social History   Socioeconomic History   Marital status: Married    Spouse name: Dub Mikes   Number of children: 3   Years of education: College   Highest education level: Not on file  Occupational History   Occupation: Electrical engineer    Comment: Futures trader at [employer] Use   Smoking status: Every Day    Packs/day: 0.50    Types: Cigarettes   Smokeless tobacco: Never  Vaping Use   Vaping Use: Never used  Substance and Sexual Activity   Alcohol use: Yes    Alcohol/week: 1.0 standard drink    Types: 1 Glasses of wine per week    Comment: one drink about once a month   Drug use: No   Sexual activity: Yes    Birth control/protection: Other-see comments    Comment: pt had hyst  Other Topics Concern   Not on file  Social History Narrative  Lives with husband   Social Determinants of Health   Financial Resource Strain: Not on file  Food Insecurity: Not on file  Transportation Needs: Not on file  Physical Activity: Not on file  Stress: Not on file  Social Connections: Not on file  Intimate Partner Violence: Not on file    Past Medical History, Surgical history, Social history, and Family history were reviewed and updated as appropriate.   Please see review of systems for further details on the patient's  review from today.   Objective:   Physical Exam:  LMP 05/20/2010   Physical Exam Constitutional:      General: She is not in acute distress.    Appearance: She is well-developed.  Musculoskeletal:        General: No deformity.  Neurological:     Mental Status: She is alert and oriented to person, place, and time.     Cranial Nerves: No dysarthria.     Coordination: Coordination normal.  Psychiatric:        Attention and Perception: Attention and perception normal. She does not perceive auditory or visual hallucinations.        Mood and Affect: Mood is anxious. Mood is not depressed. Affect is not labile, blunt, tearful or inappropriate.        Speech: Speech normal.        Behavior: Behavior normal. Behavior is cooperative.        Thought Content: Thought content normal. Thought content is not paranoid or delusional. Thought content does not include homicidal or suicidal ideation. Thought content does not include homicidal or suicidal plan.        Cognition and Memory: Cognition and memory normal.        Judgment: Judgment normal.     Comments: Insight intact SI resolved. Dep resolved at present    Lab Review:     Component Value Date/Time   NA 140 03/01/2020 1731   K 3.8 03/01/2020 1731   CL 102 03/01/2020 1731   CO2 25 03/01/2020 1731   GLUCOSE 92 03/01/2020 1731   BUN 9 03/01/2020 1731   CREATININE 1.17 (H) 03/01/2020 1731   CALCIUM 10.0 03/01/2020 1731   PROT 6.8 03/01/2020 1731   ALBUMIN 4.5 03/01/2020 1731   AST 28 03/01/2020 1731   ALT 22 03/01/2020 1731   ALKPHOS 68 03/01/2020 1731   BILITOT 0.8 03/01/2020 1731   GFRNONAA 55 (L) 03/01/2020 1731   GFRAA >60 10/10/2015 1115       Component Value Date/Time   WBC 7.3 03/01/2020 1731   RBC 5.12 (H) 03/01/2020 1731   HGB 16.5 (H) 03/01/2020 1731   HCT 50.9 (H) 03/01/2020 1731   PLT 211 03/01/2020 1731   MCV 99.4 03/01/2020 1731   MCH 32.2 03/01/2020 1731   MCHC 32.4 03/01/2020 1731   RDW 11.9 03/01/2020  1731   LYMPHSABS 1.2 03/01/2020 1731   MONOABS 0.4 03/01/2020 1731   EOSABS 0.1 03/01/2020 1731   BASOSABS 0.1 03/01/2020 1731    Lithium Lvl  Date Value Ref Range Status  03/01/2020 0.41 (L) 0.60 - 1.20 mmol/L Final    Comment:    Performed at [facility], [place] [address] [place], [place] [address]     No results found for: PHENYTOIN, PHENOBARB, VALPROATE, CBMZ   Lithium level received April 27, 2018 was 0.7.  This is probably adequate to help reduce the risk of relapse of depression post [place].  Therefore no med changes.  Vitamin D level April 27, 2018 on supplement was 60 and adequate. .res Assessment: Plan:    Jasiah was seen today for follow-up and depression.  Diagnoses and all orders for this visit:  Recurrent major depression resistant to treatment [facility])  First time seizure ([place])  Insomnia due to mental condition -     Daridorexant HCl (QUVIVIQ) 50 MG TABS; Take 50 mg by mouth at bedtime.  Weight gain due to medication -     pioglitazone (ACTOS) 15 MG tablet; Take 1 tablet (15 mg total) by mouth daily.  Generalized anxiety disorder  Low vitamin D level   Greater than 50% of 25 min non face to face time with patient was spent on counseling and coordination of care. 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  Reviewed multiple failed medications noted above.  She failed the attempt to wean Zyprexa because of worsening anxiety.  Anxiety is helped with 2.5 mg Zyprexa daily.  Wellbutrin reduced to 300 bc GTC sz of unknown origin.  Was better with 450 mg but OK so far with reduction. SZ WU normal so far.  Pending EEG.  Overall depression is better controlled that it has been in some time.  She still has some sleep problems despite several meds to help with sleep.  Defer per her request NAC 600 mg or more daily.  She feels her protein intake is adequate and her weight is stable.  Trial dayvigo 10 failed.  Trial Quiviviq 50 nightly  Metformin wean DT NR.  Actos 15  mg trial for antipsychotic weight gain likely DT insulin resistance. Disc weight gain.  Energy is better and no depression.  B12 seemed to help.   Option Aricept DT memory concerns.  She doesn't want more meds.  Her SI is gone after increasing lithium to 450 mg for SI and depression.  Depression resolved.  Discussed potential metabolic side effects associated with atypical antipsychotics, as well as potential risk for movement side effects. Advised pt to contact office if movement side effects occur.   She felt 50% better with last [place] but 100% better with first round [place].  Counseled patient regarding potential benefits, risks, and side effects of lithium to include potential risk of lithium affecting thyroid and renal function.  Discussed need for periodic lab monitoring to determine drug level and to assess for potential adverse effects.  Counseled patient regarding signs and symptoms of lithium toxicity and advised that they notify office immediately or seek urgent medical attention if experiencing these signs and symptoms.  Patient advised to contact office with any questions or concerns.  Disc lithium and toxicity. Switch lithium to morning to eliminate nocturia. Only taking lithium 300 mg and No SI either. 0.3  on 10/03/20  See PCP about balance px.  May need neuro consult.  Vitamin D 5000 units daily  FU 6 weeks  Lynder Parents, MD, DFAPA    Please see After Visit Summary for patient specific instructions.  Future Appointments  Date Time Provider [place]  03/09/2021 11:00 AM Alric Ran, MD [facility] None    No orders of the defined types were placed in this encounter.  Start Actos 15 mg once daily with a meal.  If after 2 weeks there is no noticeable benefit and no side effects then increase to 30 mg daily.   Lynder Parents, MD, DFAPA   -------------------------------

## 2020-12-22 NOTE — Patient Instructions (Signed)
Start Actos 15 mg once daily with a meal.  If after 2 weeks there is no noticeable benefit and no side effects then increase to 30 mg daily.

## 2020-12-26 ENCOUNTER — Other Ambulatory Visit: Payer: Self-pay | Admitting: Neurology

## 2020-12-26 DIAGNOSIS — F5105 Insomnia due to other mental disorder: Secondary | ICD-10-CM

## 2020-12-29 ENCOUNTER — Telehealth: Payer: Self-pay | Admitting: Psychiatry

## 2020-12-29 ENCOUNTER — Other Ambulatory Visit: Payer: Self-pay

## 2020-12-29 DIAGNOSIS — F5105 Insomnia due to other mental disorder: Secondary | ICD-10-CM

## 2020-12-29 MED ORDER — ALPRAZOLAM 1 MG PO TABS: ORAL_TABLET | ORAL | 4 refills | Status: DC

## 2020-12-29 NOTE — Telephone Encounter (Signed)
Last refill 8/26, pended for Dr. Clovis Pu to send

## 2020-12-29 NOTE — Telephone Encounter (Signed)
Pt requesting new Rx for Alprazolam 1 mg No RF. [facility] [address]

## 2021-01-02 ENCOUNTER — Other Ambulatory Visit: Payer: BC Managed Care – PPO

## 2021-01-12 ENCOUNTER — Telehealth: Payer: Self-pay | Admitting: Psychiatry

## 2021-01-12 NOTE — Telephone Encounter (Signed)
Pt called requesting Rx for Lybalvi. Have been using samples.  Asking to increase Piolitazone and reporting Quviviq for sleep is not working.  Contact pt @ [phone]. [facility] [address]

## 2021-01-12 NOTE — Telephone Encounter (Signed)
Need more time to investigate her concerns and options

## 2021-01-12 NOTE — Telephone Encounter (Signed)
Rtc to pt and she reports she started taking the Lybalvi again just 1/2 dose and feels like its working and she wants a rx. Informed her it would need a prior authorization as well. She is also asking for an increase dose in [place], she mentioned it was discussed she could increase it at last visit if needed. She also reports still no sleep with the Quiviviq, asking what is next? She can doze off but then is just up and down all night from the couch to the bed.   Informed her I would touch base with Dr. Clovis Pu and give her a call back tomorrow.

## 2021-01-13 ENCOUNTER — Ambulatory Visit (HOSPITAL_COMMUNITY): Payer: BC Managed Care – PPO

## 2021-01-13 ENCOUNTER — Other Ambulatory Visit: Payer: Self-pay | Admitting: Psychiatry

## 2021-01-13 DIAGNOSIS — T50905A Adverse effect of unspecified drugs, medicaments and biological substances, initial encounter: Secondary | ICD-10-CM

## 2021-01-13 DIAGNOSIS — R635 Abnormal weight gain: Secondary | ICD-10-CM

## 2021-01-13 NOTE — Telephone Encounter (Signed)
Pt aware Dr. Clovis Pu is still working on her requests

## 2021-01-14 ENCOUNTER — Other Ambulatory Visit: Payer: Self-pay | Admitting: Psychiatry

## 2021-01-14 DIAGNOSIS — R945 Abnormal results of liver function studies: Secondary | ICD-10-CM

## 2021-01-14 DIAGNOSIS — T50905A Adverse effect of unspecified drugs, medicaments and biological substances, initial encounter: Secondary | ICD-10-CM

## 2021-01-14 MED ORDER — PIOGLITAZONE HCL 30 MG PO TABS: 15.0000 mg | ORAL_TABLET | Freq: Every day | ORAL | 0 refills | Status: DC

## 2021-01-14 NOTE — Telephone Encounter (Signed)
RE: increase in Actos for weight loss.  RX written but she must get the blood test to make sure it's not irritating her liver bc it can.  Lab order sent to [facility]  Re: sleep, I'll have to get back on it bc she's failed multiple meds

## 2021-01-14 NOTE — Telephone Encounter (Signed)
See previous note about Actos and lab test  Re: sleep trazodone is not on her list but I feel certain she has tried it, please verify Otherwise, we can switch out Xanax to another BZ like clonazepam or diazepam.  I'm running out of options for her sleep.

## 2021-01-15 ENCOUNTER — Other Ambulatory Visit: Payer: Self-pay

## 2021-01-15 ENCOUNTER — Other Ambulatory Visit: Payer: Self-pay | Admitting: Psychiatry

## 2021-01-15 ENCOUNTER — Telehealth: Payer: Self-pay | Admitting: Psychiatry

## 2021-01-15 MED ORDER — LYBALVI 5-10 MG PO TABS: 1.0000 | ORAL_TABLET | Freq: Every day | ORAL | 1 refills | Status: DC

## 2021-01-15 NOTE — Telephone Encounter (Signed)
Will review and call back with instructions from Dr. Clovis Pu

## 2021-01-15 NOTE — Telephone Encounter (Signed)
Pt LVM stating she picked up a script today and deosn't think the directions for taking it are correct.  Pls call her.

## 2021-01-15 NOTE — Telephone Encounter (Signed)
Left message to discuss medication.

## 2021-01-16 NOTE — Telephone Encounter (Signed)
Pending PA

## 2021-01-16 NOTE — Telephone Encounter (Signed)
Pt called this morning about message from yesterday, asking Traci to return call today before 4pm @ [phone]

## 2021-01-16 NOTE — Telephone Encounter (Signed)
Rtc to pt and discussed her dose of Actos should be 30 mg daily. Instructed to take 1 whole tablet and I would update her Rx. She is going to [facility] on Monday to get labs and will come by to pick up samples of Lybalvi 5-10 mg.

## 2021-01-21 NOTE — Telephone Encounter (Signed)
Called pt to see if she would like to schedule EEG at [facility], she stated she was not interested in having test at this time.

## 2021-01-22 ENCOUNTER — Other Ambulatory Visit: Payer: Self-pay | Admitting: Psychiatry

## 2021-01-22 LAB — ALT: ALT: 23 U/L (ref 6–29)

## 2021-01-22 NOTE — Progress Notes (Signed)
Let her know her liver enzyme was normal and she can increase the Actos that we sent for her

## 2021-01-23 NOTE — Progress Notes (Signed)
Please call pt with info

## 2021-01-23 NOTE — Progress Notes (Signed)
Patient notified

## 2021-01-30 ENCOUNTER — Encounter: Payer: Self-pay | Admitting: Psychiatry

## 2021-01-30 ENCOUNTER — Other Ambulatory Visit: Payer: Self-pay

## 2021-01-30 ENCOUNTER — Ambulatory Visit (INDEPENDENT_AMBULATORY_CARE_PROVIDER_SITE_OTHER): Payer: BC Managed Care – PPO | Admitting: Psychiatry

## 2021-01-30 DIAGNOSIS — F411 Generalized anxiety disorder: Secondary | ICD-10-CM | POA: Diagnosis not present

## 2021-01-30 DIAGNOSIS — F339 Major depressive disorder, recurrent, unspecified: Secondary | ICD-10-CM

## 2021-01-30 DIAGNOSIS — R635 Abnormal weight gain: Secondary | ICD-10-CM

## 2021-01-30 DIAGNOSIS — F5105 Insomnia due to other mental disorder: Secondary | ICD-10-CM

## 2021-01-30 MED ORDER — DAYVIGO 10 MG PO TABS: 10.0000 mg | ORAL_TABLET | Freq: Every evening | ORAL | 3 refills | Status: DC

## 2021-01-30 MED ORDER — BUPROPION HCL ER (XL) 300 MG PO TB24: 300.0000 mg | ORAL_TABLET | Freq: Every day | ORAL | 1 refills | Status: DC

## 2021-01-30 MED ORDER — ALPRAZOLAM 0.5 MG PO TABS: 0.7500 mg | ORAL_TABLET | Freq: Every evening | ORAL | 1 refills | Status: DC | PRN

## 2021-01-30 NOTE — Progress Notes (Signed)
[name] [id number] 11-24-1965 55 y.o.     Subjective:   Patient ID:  [name] is a 55 y.o. (DOB 1965-04-30) female.  Chief Complaint:  Chief Complaint  Patient presents with   Follow-up   Recurrent major depression resistant to treatment    Weight gain due to medication   Medication Problem   Sleeping Problem     Depression        Associated symptoms include decreased concentration.  Associated symptoms include no fatigue, no appetite change and no suicidal ideas. Medication Refill Pertinent negatives include no abdominal pain, fatigue or weakness.   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  At visit March 2020.  She had completed [place] early December.  "It worked"  and had resolution of her depression!  She started Wellbutrin XL 300 mg daily for relapse prevention.  She continued olanzapine 2.5 mg every afternoon because her anxiety relapsed when she would try to completely discontinue it.  Stopped lithium DT SE and felt worse and restarted 300 daily.   In may 2020 pt taking lithium 600 mg and CO balance problems.  She stopped lithium and lithium level at 62 hours later was 0.4, suggesting possible prior toxicity so the lithium was reduced to 300 mg.    in June.  Because Wellbutrin had affected her sleep we switched it to Wellbutrin SR 200 mg every morning and encouraged her to reduce caffeine.  She had felt well but Wellbutrin XL 300 mg made her edgy and caused some insomnia.  She was also encouraged to take NAC as a supplement daily.  She called back September 14 stating that she wanted to switch back to Wellbutrin XL 300 mg because she felt that SR 200 mg was not working sufficiently.  She was cautioned about it contributing to insomnia because there was a concern and had done that in the past.  At visit January 15, 2019 the following was noted: Started a new job  temporarily which didn't work.  Was so nervous going and needed 1/2 Xanax to go to work.  Couldn't meed the schedule demands Still has balance issues and memory. She reduced the lithium from 300 to 150 mg daily bc she doesn't like taking a lot of meds and husband doesn't either.  She made the change on her own. Restarted [place] July August 2020 and felt much better.  Seeing [name] at [place].  .  Mostly resolved with this tx and finished end of August.  Was good for a month and then the depression returned.  Kind of avoiding people, not severe.  But had periods of "horrific suicidal thoughts" though comments to safety.   Siister died January 07, 2023 COPD and was close to her.   visit Jan 15, 2019 and increased lithium to 450, suggested B12 for energy.  seen March 27, 2019.  The following was noted: Anxiety is helped with 2.5 mg Zyprexa daily.  Option increase nefazodone again.  Some compliance issues. She doesn't like taking so much meds. incr Wellbutrin XL 450 mg AM if tolerated.  Disc SE including more anxiety, insomnia. Trial B12 to see if energy is better in the morning.  Her SI is gone after increasing lithium to 450 mg for SI and depression.  Continue this She continues sleep meds including Belsomra 20 mg nightly Requested lithium level  seen May 08, 2019.  The following was noted Increased Wellbutrin 450 too  much jitteriness.  So reduced to 300 mg. Terrible STM. Loses track of thoughts in the middle of sentences.  Poor penmanship comes and goes. Sometimes when walks right foot sticks to the ground with one fall.   So tired.   SI resolved overnight with increased lithium.  Still having problems with balance.  Some word-finding issues and anxiety.  Energy is better.  Sleep is good at this time. Some benefit with incr Wellbutrin.  Exhausted.  At times has to lay down.  Hard to get up and go work out in the morning.  Lower nefazadone 200 mg did not help her energy and her sleep was  worse.     But still tired.  During [place] no depression and no SI but still tired.   Depression has recurred to a moderate level anxiety is still moderate. Stays busy and active.  Exercising still.  Lost weight with Wellbutrin.  Jobless not good. Anxious at times..  But kids around.  Function is ok.  Doesn't eat much protein.  Eats peanut butter, no eggs, no meat.   Sleeping with Belsomra and Xanax. 9-10 hours CO dry mouth and cavities.  Metal taste in her mouth.  Easily confused.   No meds were changed.  By early 2021 nefazodone was no longer manufactured and then became unavailable to her abruptly.  She called July 11, 2019 and the 2 best options were felt to be either switch to higher dose trazodone though it is probably not as effective as an antidepressant as is nefazodone or the other option Viibryd.  She chose to switch to trazodone because when she discontinued nefazodone her initial chief complaint was insomnia.  07/17/2019 appointment the following is reported:  Hit a wall with abrupt stopping of nefazodone.  Still feels wired. Up at night cleaning.  No wreckless impulsivity.  Head is fuzzy.   Started trazodone 100 mg is helping some. Last 2 nights forgot meds.  Sleep fair with trazodone.  No SE.   Felt wired off nefazodone Down to 1/2 Xanax for 5 days or longer.  Wants to stop it. Don't feel depressed. Lost appetite off nefazodone and losing weight. Recommendation was to increase trazodone gradually to 300 mg nightly if tolerated to help compensate for the absence of nefazodone and hopes of helping depression.  07/30/2019 phone call complaining of muscle weakness. 08/30/2019 phone call asking to increase Wellbutrin from 300 to 450 mg daily.  She had tried this in the past but it made her jittery but it was agreed she could retry it.  09/17/2019 appointment with the following noted: Overall doing well.  Increase trazodone to 300 mg HS and still can't sleep well at times. Feels better with  higher dose Wellbutrin and it didn't seem to worsen  Sleep. Tolerating changes except tremor. No taste for coffee any more.  No smoking in a year.  Wellbutrin helped.  Pilar Plate and kids good.   Better energy and less fatigue.  Less depression. Chronic sleep issues.  No anxiety.  Appetite OK.  Just don't eat like most people but does get protein.  Took a month off nefazodone to adjust.   Plan: Trial of Dayvigo in place of Belsomra at 5-10 mg HS. if it works better call back and let us know and we will change the prescription.  12/20/2019 appt with the following noted: Dayvigo not covered by insurance and made her a little dizzy in AM. Overall satisfied with Belsomra. CO metal taste in her mouth for  a long time. CO forgetful and loses train of thought.  Wonders if related to psych treatment She's reduced trzodone to 100 mg HS.  High doses made her dizzy. No depression.  Boys had Covid. She and H not vaccinated. Plan: No med changes  03/17/2020 appointment with the following noted: Disc recent accidental OD Wellbutrin with doubling dose.  It was horrible on 900 mg Wellbutrin.    Now only taking 300 mg Wellbutrin but H worries.  Better at 450 mg but is relatively OK.  Not sig depressed.  Satisfied for now. 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 Weaned off trazodone and is OK.  Sleep is inconsistent and then tired if that happens.  Asked questions about psychadelics in news for info for mental health problems.  Some anxiety lately. Plan:   No med changes  06/17/2020 appointment with following noted: Went back to Wellbutrin 450 mid January to help depression. No SE Not depressed. Li SE taste pennies comes and goes. Tried again to slowly taper off olanzapine and relapsed again and back on 2.5 mg HS and is better.  Trouble with weight in middle of gut. Lysine helped tongue soreness.   Never got Covid.  Despite family got it.  09/17/20 appt note: No weight loss with metformin. 123.8# today and  5'3" Mental health is good.  Rel with H good. Questions with meds. Trouble with initial and terminal but mostly awakening.  Not making her anxious. No significant depression or suicidal thoughts since she was here.  The anxiety is manageable with the olanzapine.  Having some nocturia. Plan: Switch lithium to morning to eliminate nocturia. Check lithium level Wean metformin due to the response Anxiety is helped with 2.5 mg Zyprexa daily. Switch to Lybalvi 5 mg bc failure of metformin bc still gaining weight.  The increased dose may also help with sleep.  Anxiety is manageable at present.   12/21/2020 appointment with the following noted,: Trouble with EFA. Dayvigo NR like Belsomra Patient reports stable mood and denies depressed or irritable moods.  Patient denies any recent difficulty with anxiety.   Denies appetite disturbance.  Patient reports that energy and motivation have been good.  Patient denies any difficulty with concentration.  Patient denies any suicidal ideation. Ongoing concerns about weight gain with olanzapine despite exercise and limiting calories. Plan: Trial dayvigo 10 failed.  Trial Quiviviq 50 nightly Metformin wean DT NR.  Actos 15 mg trial for antipsychotic weight gain likely DT insulin resistance. Switch lithium to morning to eliminate nocturia. Only taking lithium 300 mg and No SI either. 0.3  on 10/03/20  01/30/2021 appointment with the following noted: Finally losing some fat with combo Lybalvi & Actos.  Very happy with change. Nocturia 2-5  times but right back to sleep usually.  Not happening now.  Stopped Dwana Curd about 5 days ago.  Doesn't have it and returned to [facility]. Margarita Mail is $45 and Dayvigo $5. Trying to wean off alprazolam and wants to do it. Overall sleep is better.  Anxiety manageable.  Depression under control.  She is making some progress with weight loss  Long history of severe treatment resistant depression having failed multiple medications  including imipramine,  paroxeine , nefazodone,   Viibryd, Prozac, venlafaxine, Emsam 9 mg, Wellbutrin 450, Pristiq, , mirtazapine nefazodone 600 sedation, duloxetine 90 mg, Trintellix, lithium 600, lamotrigine ? Response, Deplin, TMS, Latuda, lithium, olanzapine, quetiapine, Lybalvi Rexulti caused NMS, Abilify, Vyvanse,  pramipexole,  ropinirole,  ProSom, Belsomra lost response,  Dayvigo better,  temazepam, Xanax,  doxepin, Lunesta, gabapentin,  Vyvanse,  Provigil, Zofran, and she did respond to ECT.,   NAC  Sister gained weight on olanazapine  Review of Systems:  Review of Systems  Constitutional:  Negative for appetite change, fatigue and unexpected weight change.  HENT:  Negative for dental problem.   Gastrointestinal:  Positive for constipation. Negative for abdominal pain.  Neurological:  Negative for tremors and weakness.  Psychiatric/Behavioral:  Positive for decreased concentration. Negative for agitation, behavioral problems, confusion, dysphoric mood, hallucinations, self-injury, sleep disturbance and suicidal ideas. The patient is nervous/anxious. The patient is not hyperactive.    Medications: I have reviewed the patient's current medications.  Current Outpatient Medications  Medication Sig Dispense Refill   Cholecalciferol (VITAMIN D3) 50 MCG (2000 UT) TABS Take 2,000 Units by mouth daily with breakfast.     Cyanocobalamin ER (B-12 DUAL SPECTRUM) 5000 MCG TBCR Take 1 tablet by mouth daily with breakfast.     lithium carbonate 150 MG capsule Take 3 capsules (450 mg total) by mouth every evening. (Patient taking differently: Take 300 mg by mouth every evening. Patient taking 3 100-mg tablets, not 450 mg) 270 capsule 1   OLANZapine-Samidorphan (LYBALVI) 5-10 MG TABS Take 1 tablet by mouth daily. (Patient taking differently: Take 2.5 mg by mouth daily. Has samples, have not been prescribed this.) 30 tablet 1   pantoprazole (PROTONIX) 40 MG tablet Take 40 mg by mouth daily.      pioglitazone (ACTOS) 30 MG tablet Take 0.5 tablets (15 mg total) by mouth daily. (Patient taking differently: Take 30 mg by mouth daily.) 30 tablet 0   ALPRAZolam (XANAX) 0.5 MG tablet Take 1.5 tablets (0.75 mg total) by mouth at bedtime as needed for anxiety. 45 tablet 1   buPROPion (WELLBUTRIN XL) 300 MG 24 hr tablet Take 1 tablet (300 mg total) by mouth daily. 90 tablet 1   Lemborexant (DAYVIGO) 10 MG TABS Take 10 mg by mouth at bedtime. 30 tablet 3   OLANZapine (ZYPREXA) 5 MG tablet Take 2.5 mg by mouth at bedtime. Pt takes half a tab (Patient not taking: No sig reported)     No current facility-administered medications for this visit.    Medication Side Effects: None  Allergies: No Known Allergies  Past Medical History:  Diagnosis Date   Abdominal pain    Anal itching    Anxiety    Breast inflammation    Breast nodule    left   Chronic kidney disease    Cystocele    Decreased libido    Depression    Diverticular disease    Eczema    Fibroadenoma    Galactorrhea    IBS (irritable bowel syndrome)    Mastodynia    Pelvic pain in female    Pelvic relaxation    Rectocele    Seizure-like activity (HCC)    Serotonin syndrome     Family History  Problem Relation Age of Onset   Heart disease Mother        angioplasty   Cancer Father        prostate   Hypertension Father    Depression Father    Bipolar disorder Father    Rheum arthritis Sister    Rheum arthritis Brother    Hypertension Maternal Grandmother    Diabetes Maternal Grandmother        borderline   Hypertension Paternal Grandmother    Arthritis Paternal Grandmother    Stroke Paternal Grandmother    Rheum arthritis Paternal Grandfather  Breast cancer Neg Hx    FHx: sister on olanzapine 5mg  and fluoxetine.  Social History   Socioeconomic History   Marital status: Married    Spouse name: Dub Mikes   Number of children: 3   Years of education: College   Highest education level: Not on file   Occupational History   Occupation: Electrical engineer    Comment: Futures trader at [employer] Use   Smoking status: Every Day    Packs/day: 0.50    Types: Cigarettes   Smokeless tobacco: Never  Vaping Use   Vaping Use: Never used  Substance and Sexual Activity   Alcohol use: Yes    Alcohol/week: 1.0 standard drink    Types: 1 Glasses of wine per week    Comment: one drink about once a month   Drug use: No   Sexual activity: Yes    Birth control/protection: Other-see comments    Comment: pt had hyst  Other Topics Concern   Not on file  Social History Narrative   Lives with husband   Social Determinants of Health   Financial Resource Strain: Not on file  Food Insecurity: Not on file  Transportation Needs: Not on file  Physical Activity: Not on file  Stress: Not on file  Social Connections: Not on file  Intimate Partner Violence: Not on file    Past Medical History, Surgical history, Social history, and Family history were reviewed and updated as appropriate.   Please see review of systems for further details on the patient's review from today.   Objective:   Physical Exam:  LMP 05/20/2010   Physical Exam Constitutional:      General: She is not in acute distress.    Appearance: She is well-developed.  Musculoskeletal:        General: No deformity.  Neurological:     Mental Status: She is alert and oriented to person, place, and time.     Cranial Nerves: No dysarthria.     Coordination: Coordination normal.  Psychiatric:        Attention and Perception: Attention and perception normal. She does not perceive auditory or visual hallucinations.        Mood and Affect: Mood is anxious. Mood is not depressed. Affect is not labile, blunt or inappropriate.        Speech: Speech normal.        Behavior: Behavior normal. Behavior is cooperative.        Thought Content: Thought content normal. Thought content is not paranoid or delusional. Thought content does not  include homicidal or suicidal ideation. Thought content does not include homicidal or suicidal plan.        Cognition and Memory: Cognition and memory normal.        Judgment: Judgment normal.     Comments: Insight intact SI resolved. Dep resolved at present    Lab Review:     Component Value Date/Time   NA 140 03/01/2020 1731   K 3.8 03/01/2020 1731   CL 102 03/01/2020 1731   CO2 25 03/01/2020 1731   GLUCOSE 92 03/01/2020 1731   BUN 9 03/01/2020 1731   CREATININE 1.17 (H) 03/01/2020 1731   CALCIUM 10.0 03/01/2020 1731   PROT 6.8 03/01/2020 1731   ALBUMIN 4.5 03/01/2020 1731   AST 28 03/01/2020 1731   ALT 23 01/21/2021 0957   ALKPHOS 68 03/01/2020 1731   BILITOT 0.8 03/01/2020 1731   GFRNONAA 55 (L) 03/01/2020 1731   GFRAA >60 10/10/2015  1115       Component Value Date/Time   WBC 7.3 03/01/2020 1731   RBC 5.12 (H) 03/01/2020 1731   HGB 16.5 (H) 03/01/2020 1731   HCT 50.9 (H) 03/01/2020 1731   PLT 211 03/01/2020 1731   MCV 99.4 03/01/2020 1731   MCH 32.2 03/01/2020 1731   MCHC 32.4 03/01/2020 1731   RDW 11.9 03/01/2020 1731   LYMPHSABS 1.2 03/01/2020 1731   MONOABS 0.4 03/01/2020 1731   EOSABS 0.1 03/01/2020 1731   BASOSABS 0.1 03/01/2020 1731    Lithium Lvl  Date Value Ref Range Status  03/01/2020 0.41 (L) 0.60 - 1.20 mmol/L Final    Comment:    Performed at [facility], [place] [address] [place], [place] [address]     No results found for: PHENYTOIN, PHENOBARB, VALPROATE, CBMZ   Lithium level received April 27, 2018 was 0.7.  This is probably adequate to help reduce the risk of relapse of depression post [place].  Therefore no med changes.    Vitamin D level April 27, 2018 on supplement was 60 and adequate. .res Assessment: Plan:    [name] was seen today for follow-up, recurrent major depression resistant to treatment , weight gain due to medication, medication problem and sleeping problem.  Diagnoses and all orders for this visit:  Recurrent  major depression resistant to treatment ([place]) -     buPROPion (WELLBUTRIN XL) 300 MG 24 hr tablet; Take 1 tablet (300 mg total) by mouth daily.  Generalized anxiety disorder  Insomnia due to mental condition -     Lemborexant (DAYVIGO) 10 MG TABS; Take 10 mg by mouth at bedtime. -     ALPRAZolam (XANAX) 0.5 MG tablet; Take 1.5 tablets (0.75 mg total) by mouth at bedtime as needed for anxiety.  Weight gain due to medication   Greater than 50% of 30 min  face to face time with patient was spent on counseling and coordination of care. 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  Reviewed multiple failed medications noted above.  She failed the attempt to wean Zyprexa because of worsening anxiety.  Anxiety is helped with 2.5 mg Lybalvi daily.  Wellbutrin reduced to 300 bc GTC sz of unknown origin.  Was better with 450 mg but OK so far with reduction. SZ WU normal so far.  Pending EEG.  Overall depression is better controlled that it has been in some time.  She still has some sleep problems despite several meds to help with sleep.  Defer per her request NAC 600 mg or more daily.  She feels her protein intake is adequate and her weight is stable.  Continue dayvigo 10.    Actos 30 mg trial for antipsychotic weight gain likely DT insulin resistance. Disc weight gain.  She is now making some progress with weight loss with a combination of Lybalvi and Actos.  She will recognizes the Actos is off label and has some liver risks.  Energy is better and no depression.  B12 seemed to help.   Option Aricept DT memory concerns.  She doesn't want more meds.  Her SI is gone after increasing lithium to 450 mg for SI and depression.  Depression resolved.  Discussed potential metabolic side effects associated with atypical antipsychotics, as well as potential risk for movement side effects. Advised pt to contact office if movement side effects occur.   We discussed the short-term risks associated with benzodiazepines  including sedation and increased fall risk among others.  Discussed long-term side  effect risk including dependence, potential withdrawal symptoms, and the potential eventual dose-related risk of dementia.  But recent studies from 2020 dispute this association between benzodiazepines and dementia risk. Newer studies in 2020 do not support an association with dementia. She wants to try to taper Xanax.  She is aware of potential worsening insomnia.  She was given the following schedule. Take alprazolam 1 and 1/2 of 0.5 mg tablets for 1 week,  Then reduce to 1 tablet nightly for 2-4 weeks, Then reduce to 1/2 tablet nightly for 2-4 weeks then stop   She felt 50% better with last [place] but 100% better with first round [place].  Counseled patient regarding potential benefits, risks, and side effects of lithium to include potential risk of lithium affecting thyroid and renal function.  Discussed need for periodic lab monitoring to determine drug level and to assess for potential adverse effects.  Counseled patient regarding signs and symptoms of lithium toxicity and advised that they notify office immediately or seek urgent medical attention if experiencing these signs and symptoms.  Patient advised to contact office with any questions or concerns.  Disc lithium and toxicity. Switch lithium to morning to eliminate nocturia. Only taking lithium 300 mg and No SI either. 0.3  on 10/03/20 She forgot to make the switch after the last visit.  See PCP about balance px.  May need neuro consult.  Vitamin D 5000 units daily  FU 6 weeks  Lynder Parents, MD, DFAPA    Please see After Visit Summary for patient specific instructions.  Future Appointments  Date Time Provider [place]  03/16/2021 11:15 AM Alric Ran, MD [facility] None  04/08/2021  1:30 PM Cottle, Billey Co., MD [facility] None    No orders of the defined types were placed in this encounter.  Start Actos 15 mg once daily with a meal.  If after 2  weeks there is no noticeable benefit and no side effects then increase to 30 mg daily.   Lynder Parents, MD, DFAPA   -------------------------------

## 2021-01-30 NOTE — Patient Instructions (Addendum)
Take alprazolam 1 and 1/2 of 0.5 mg tablets for 1 week,  Then reduce to 1 tablet nightly for 2-4 weeks, Then reduce to 1/2 tablet nightly for 2-4 weeks then stop   Switch lithium to AM to reduce night time awakenings

## 2021-02-04 ENCOUNTER — Ambulatory Visit: Payer: BC Managed Care – PPO | Admitting: Diagnostic Neuroimaging

## 2021-02-11 ENCOUNTER — Telehealth: Payer: Self-pay | Admitting: Psychiatry

## 2021-02-11 ENCOUNTER — Other Ambulatory Visit: Payer: Self-pay

## 2021-02-11 DIAGNOSIS — T50905A Adverse effect of unspecified drugs, medicaments and biological substances, initial encounter: Secondary | ICD-10-CM

## 2021-02-11 MED ORDER — PIOGLITAZONE HCL 30 MG PO TABS
30.0000 mg | ORAL_TABLET | Freq: Every day | ORAL | 2 refills | Status: DC
Start: 2021-02-11 — End: 2021-03-17

## 2021-02-11 NOTE — Telephone Encounter (Signed)
Pt called and said that she needs a new script [facility] 30 [facility]os [facility] on [address]

## 2021-02-11 NOTE — Telephone Encounter (Signed)
[facility]

## 2021-03-03 ENCOUNTER — Telehealth: Payer: Self-pay | Admitting: Psychiatry

## 2021-03-03 NOTE — Telephone Encounter (Signed)
[name] was given samples of Olanzapine 5 mg but says that they are not working. She would like to go back to the original Olanzapine she was previously using. [facility]  [id number] [place] [place] [address] [id number] [place] [place] [address] [place] [place] [address]  Phone:  [phone]  Fax:  [phone]

## 2021-03-04 ENCOUNTER — Other Ambulatory Visit: Payer: Self-pay | Admitting: Psychiatry

## 2021-03-04 MED ORDER — OLANZAPINE 5 MG PO TABS: 5.0000 mg | ORAL_TABLET | Freq: Every day | ORAL | 0 refills | Status: DC

## 2021-03-04 NOTE — Telephone Encounter (Signed)
Patient notified

## 2021-03-04 NOTE — Telephone Encounter (Signed)
RX olanzapine sent.

## 2021-03-08 ENCOUNTER — Other Ambulatory Visit: Payer: Self-pay | Admitting: Psychiatry

## 2021-03-08 DIAGNOSIS — R635 Abnormal weight gain: Secondary | ICD-10-CM

## 2021-03-09 ENCOUNTER — Ambulatory Visit: Payer: BC Managed Care – PPO | Admitting: Neurology

## 2021-03-16 ENCOUNTER — Ambulatory Visit: Payer: BC Managed Care – PPO | Admitting: Neurology

## 2021-03-17 ENCOUNTER — Ambulatory Visit (INDEPENDENT_AMBULATORY_CARE_PROVIDER_SITE_OTHER): Payer: BC Managed Care – PPO | Admitting: Psychiatry

## 2021-03-17 ENCOUNTER — Encounter: Payer: Self-pay | Admitting: Psychiatry

## 2021-03-17 ENCOUNTER — Other Ambulatory Visit: Payer: Self-pay

## 2021-03-17 DIAGNOSIS — R635 Abnormal weight gain: Secondary | ICD-10-CM | POA: Diagnosis not present

## 2021-03-17 DIAGNOSIS — F411 Generalized anxiety disorder: Secondary | ICD-10-CM

## 2021-03-17 DIAGNOSIS — F339 Major depressive disorder, recurrent, unspecified: Secondary | ICD-10-CM

## 2021-03-17 DIAGNOSIS — T50905A Adverse effect of unspecified drugs, medicaments and biological substances, initial encounter: Secondary | ICD-10-CM

## 2021-03-17 DIAGNOSIS — F5105 Insomnia due to other mental disorder: Secondary | ICD-10-CM | POA: Diagnosis not present

## 2021-03-17 DIAGNOSIS — R7989 Other specified abnormal findings of blood chemistry: Secondary | ICD-10-CM

## 2021-03-17 MED ORDER — PIOGLITAZONE HCL 30 MG PO TABS
30.0000 mg | ORAL_TABLET | Freq: Every day | ORAL | 2 refills | Status: DC
Start: 2021-03-17 — End: 2021-06-04

## 2021-03-17 MED ORDER — ALPRAZOLAM 0.5 MG PO TABS: 0.7500 mg | ORAL_TABLET | Freq: Every evening | ORAL | 4 refills | Status: DC | PRN

## 2021-03-17 MED ORDER — PHENTERMINE HCL 37.5 MG PO CAPS: 37.5000 mg | ORAL_CAPSULE | ORAL | 2 refills | Status: DC

## 2021-03-17 NOTE — Progress Notes (Signed)
[name] [id number] 20-Feb-1966 55 y.o.     Subjective:   Patient ID:  [name] is a 55 y.o. (DOB 20-Sep-1965) female.  Chief Complaint:  Chief Complaint  Patient presents with   Follow-up   Depression   Anxiety   Medication Problem   Sleeping Problem     Depression        Associated symptoms include decreased concentration.  Associated symptoms include no fatigue, no appetite change and no suicidal ideas. Medication Refill Pertinent negatives include no abdominal pain, fatigue or weakness.   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  At visit March 2020.  She had completed [place] early December.  "It worked"  and had resolution of her depression!  She started Wellbutrin XL 300 mg daily for relapse prevention.  She continued olanzapine 2.5 mg every afternoon because her anxiety relapsed when she would try to completely discontinue it.  Stopped lithium DT SE and felt worse and restarted 300 daily.   In may 2020 pt taking lithium 600 mg and CO balance problems.  She stopped lithium and lithium level at 62 hours later was 0.4, suggesting possible prior toxicity so the lithium was reduced to 300 mg.    in June.  Because Wellbutrin had affected her sleep we switched it to Wellbutrin SR 200 mg every morning and encouraged her to reduce caffeine.  She had felt well but Wellbutrin XL 300 mg made her edgy and caused some insomnia.  She was also encouraged to take NAC as a supplement daily.  She called back September 14 stating that she wanted to switch back to Wellbutrin XL 300 mg because she felt that SR 200 mg was not working sufficiently.  She was cautioned about it contributing to insomnia because there was a concern and had done that in the past.  At visit January 15, 2019 the following was noted: Started a new job temporarily which didn't work.  Was so nervous going and needed  1/2 Xanax to go to work.  Couldn't meed the schedule demands Still has balance issues and memory. She reduced the lithium from 300 to 150 mg daily bc she doesn't like taking a lot of meds and husband doesn't either.  She made the change on her own. Restarted [place] July August 2020 and felt much better.  Seeing [name] at [facility].  .  Mostly resolved with this tx and finished end of August.  Was good for a month and then the depression returned.  Kind of avoiding people, not severe.  But had periods of "horrific suicidal thoughts" though comments to safety.   Siister died 12/25/22 COPD and was close to her.   visit Jan 15, 2019 and increased lithium to 450, suggested B12 for energy.  seen March 27, 2019.  The following was noted: Anxiety is helped with 2.5 mg Zyprexa daily.  Option increase nefazodone again.  Some compliance issues. She doesn't like taking so much meds. incr Wellbutrin XL 450 mg AM if tolerated.  Disc SE including more anxiety, insomnia. Trial B12 to see if energy is better in the morning.  Her SI is gone after increasing lithium to 450 mg for SI and depression.  Continue this She continues sleep meds including Belsomra 20 mg nightly Requested lithium level  seen May 08, 2019.  The following was noted Increased Wellbutrin 450 too much jitteriness.  So reduced to 300 mg. Terrible STM.  Loses track of thoughts in the middle of sentences.  Poor penmanship comes and goes. Sometimes when walks right foot sticks to the ground with one fall.   So tired.   SI resolved overnight with increased lithium.  Still having problems with balance.  Some word-finding issues and anxiety.  Energy is better.  Sleep is good at this time. Some benefit with incr Wellbutrin.  Exhausted.  At times has to lay down.  Hard to get up and go work out in the morning.  Lower nefazadone 200 mg did not help her energy and her sleep was worse.     But still tired.  During [place] no depression and no SI but  still tired.   Depression has recurred to a moderate level anxiety is still moderate. Stays busy and active.  Exercising still.  Lost weight with Wellbutrin.  Jobless not good. Anxious at times..  But kids around.  Function is ok.  Doesn't eat much protein.  Eats peanut butter, no eggs, no meat.   Sleeping with Belsomra and Xanax. 9-10 hours CO dry mouth and cavities.  Metal taste in her mouth.  Easily confused.   No meds were changed.  By early 2021 nefazodone was no longer manufactured and then became unavailable to her abruptly.  She called July 11, 2019 and the 2 best options were felt to be either switch to higher dose trazodone though it is probably not as effective as an antidepressant as is nefazodone or the other option Viibryd.  She chose to switch to trazodone because when she discontinued nefazodone her initial chief complaint was insomnia.  07/17/2019 appointment the following is reported:  Hit a wall with abrupt stopping of nefazodone.  Still feels wired. Up at night cleaning.  No wreckless impulsivity.  Head is fuzzy.   Started trazodone 100 mg is helping some. Last 2 nights forgot meds.  Sleep fair with trazodone.  No SE.   Felt wired off nefazodone Down to 1/2 Xanax for 5 days or longer.  Wants to stop it. Don't feel depressed. Lost appetite off nefazodone and losing weight. Recommendation was to increase trazodone gradually to 300 mg nightly if tolerated to help compensate for the absence of nefazodone and hopes of helping depression.  07/30/2019 phone call complaining of muscle weakness. 08/30/2019 phone call asking to increase Wellbutrin from 300 to 450 mg daily.  She had tried this in the past but it made her jittery but it was agreed she could retry it.  09/17/2019 appointment with the following noted: Overall doing well.  Increase trazodone to 300 mg HS and still can't sleep well at times. Feels better with higher dose Wellbutrin and it didn't seem to worsen   Sleep. Tolerating changes except tremor. No taste for coffee any more.  No smoking in a year.  Wellbutrin helped.  Pilar Plate and kids good.   Better energy and less fatigue.  Less depression. Chronic sleep issues.  No anxiety.  Appetite OK.  Just don't eat like most people but does get protein.  Took a month off nefazodone to adjust.   Plan: Trial of Dayvigo in place of Belsomra at 5-10 mg HS. if it works better call back and let us know and we will change the prescription.  12/20/2019 appt with the following noted: Dayvigo not covered by insurance and made her a little dizzy in AM. Overall satisfied with Belsomra. CO metal taste in her mouth for a long time. CO forgetful and loses train of thought.  Wonders if related to psych treatment She's reduced trzodone to 100 mg HS.  High doses made her dizzy. No depression.  Boys had Covid. She and H not vaccinated. Plan: No med changes  03/17/2020 appointment with the following noted: Disc recent accidental OD Wellbutrin with doubling dose.  It was horrible on 900 mg Wellbutrin.    Now only taking 300 mg Wellbutrin but H worries.  Better at 450 mg but is relatively OK.  Not sig depressed.  Satisfied for now. 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 Weaned off trazodone and is OK.  Sleep is inconsistent and then tired if that happens.  Asked questions about psychadelics in news for info for mental health problems.  Some anxiety lately. Plan:   No med changes  06/17/2020 appointment with following noted: Went back to Wellbutrin 450 mid January to help depression. No SE Not depressed. Li SE taste pennies comes and goes. Tried again to slowly taper off olanzapine and relapsed again and back on 2.5 mg HS and is better.  Trouble with weight in middle of gut. Lysine helped tongue soreness.   Never got Covid.  Despite family got it.  09/17/20 appt note: No weight loss with metformin. 123.8# today and 5'3" Mental health is good.  Rel with H good. Questions with  meds. Trouble with initial and terminal but mostly awakening.  Not making her anxious. No significant depression or suicidal thoughts since she was here.  The anxiety is manageable with the olanzapine.  Having some nocturia. Plan: Switch lithium to morning to eliminate nocturia. Check lithium level Wean metformin due to the response Anxiety is helped with 2.5 mg Zyprexa daily. Switch to Lybalvi 5 mg bc failure of metformin bc still gaining weight.  The increased dose may also help with sleep.  Anxiety is manageable at present.   12/21/2020 appointment with the following noted,: Trouble with EFA. Dayvigo NR like Belsomra Patient reports stable mood and denies depressed or irritable moods.  Patient denies any recent difficulty with anxiety.   Denies appetite disturbance.  Patient reports that energy and motivation have been good.  Patient denies any difficulty with concentration.  Patient denies any suicidal ideation. Ongoing concerns about weight gain with olanzapine despite exercise and limiting calories. Plan: Trial dayvigo 10 failed.  Trial Quiviviq 50 nightly Metformin wean DT NR.  Actos 15 mg trial for antipsychotic weight gain likely DT insulin resistance. Switch lithium to morning to eliminate nocturia. Only taking lithium 300 mg and No SI either. 0.3  on 10/03/20  01/30/2021 appointment with the following noted: Finally losing some fat with combo Lybalvi & Actos.  Very happy with change. Nocturia 2-5  times but right back to sleep usually.  Not happening now.  Stopped Dwana Curd about 5 days ago.  Doesn't have it and returned to [facility]. Margarita Mail is $45 and Dayvigo $5. Trying to wean off alprazolam and wants to do it. Overall sleep is better.  Anxiety manageable.  Depression under control.  She is making some progress with weight loss Plan: Actos 30 mg trial for antipsychotic weight gain likely DT insulin resistance. She wants to try to taper Xanax.  She is aware of potential worsening  insomnia.  She was given the following schedule. Take alprazolam 1 and 1/2 of 0.5 mg tablets for 1 week,  Then reduce to 1 tablet nightly for 2-4 weeks, Then reduce to 1/2 tablet nightly for 2-4 weeks then stop   03/17/2021 appointment with the following noted: Gained 5 # on Lybalvi  5 so went back to olanzapine 2.5 mg daily. No SE with Actos. Complains sugar craving. Asks about appetite suppressant. Sleep is poor with awakening every 2 hours. Depression managed and anxiety.  One firiend commented about how well she's doing.  Laugh more.   Long history of severe treatment resistant depression having failed multiple medications including imipramine,  paroxeine , nefazodone,   Viibryd, Prozac, venlafaxine, Emsam 9 mg, Wellbutrin 450, Pristiq, , mirtazapine nefazodone 600 sedation, duloxetine 90 mg, Trintellix, lithium 600, lamotrigine ? Response, Deplin, TMS, Latuda, lithium, olanzapine, quetiapine, Lybalvi Rexulti caused NMS, Abilify, Vyvanse,  pramipexole,  ropinirole,  ProSom, Belsomra lost response,  Dayvigo better,  temazepam, Xanax, doxepin, Lunesta, gabapentin, FUXNATFT Vyvanse,  Provigil, Zofran, and she did respond to ECT.,   NAC  Sister gained weight on olanazapine  Review of Systems:  Review of Systems  Constitutional:  Positive for unexpected weight change. Negative for appetite change and fatigue.  HENT:  Negative for dental problem.   Gastrointestinal:  Positive for constipation. Negative for abdominal pain.  Neurological:  Negative for tremors and weakness.  Psychiatric/Behavioral:  Positive for decreased concentration and sleep disturbance. Negative for agitation, behavioral problems, confusion, dysphoric mood, hallucinations, self-injury and suicidal ideas. The patient is nervous/anxious. The patient is not hyperactive.    Medications: I have reviewed the patient's current medications.  Current Outpatient Medications  Medication Sig Dispense Refill   buPROPion  (WELLBUTRIN XL) 300 MG 24 hr tablet Take 1 tablet (300 mg total) by mouth daily. 90 tablet 1   Cholecalciferol (VITAMIN D3) 50 MCG (2000 UT) TABS Take 2,000 Units by mouth daily with breakfast.     Cyanocobalamin ER (B-12 DUAL SPECTRUM) 5000 MCG TBCR Take 1 tablet by mouth daily with breakfast.     Lemborexant (DAYVIGO) 10 MG TABS Take 10 mg by mouth at bedtime. 30 tablet 3   lithium carbonate 150 MG capsule Take 3 capsules (450 mg total) by mouth every evening. (Patient taking differently: Take 300 mg by mouth every evening. Patient taking 3 100-mg tablets, not 450 mg) 270 capsule 1   OLANZapine (ZYPREXA) 5 MG tablet Take 1 tablet (5 mg total) by mouth at bedtime. 90 tablet 0   pantoprazole (PROTONIX) 40 MG tablet Take 40 mg by mouth daily.     phentermine 37.5 MG capsule Take 1 capsule (37.5 mg total) by mouth every morning. 30 capsule 2   ALPRAZolam (XANAX) 0.5 MG tablet Take 1.5 tablets (0.75 mg total) by mouth at bedtime as needed for anxiety. 45 tablet 4   pioglitazone (ACTOS) 30 MG tablet Take 1 tablet (30 mg total) by mouth daily. 30 tablet 2   No current facility-administered medications for this visit.    Medication Side Effects: None  Allergies: No Known Allergies  Past Medical History:  Diagnosis Date   Abdominal pain    Anal itching    Anxiety    Breast inflammation    Breast nodule    left   Chronic kidney disease    Cystocele    Decreased libido    Depression    Diverticular disease    Eczema    Fibroadenoma    Galactorrhea    IBS (irritable bowel syndrome)    Mastodynia    Pelvic pain in female    Pelvic relaxation    Rectocele    Seizure-like activity (HCC)    Serotonin syndrome     Family History  Problem Relation Age of Onset   Heart disease Mother  angioplasty   Cancer Father        prostate   Hypertension Father    Depression Father    Bipolar disorder Father    Rheum arthritis Sister    Rheum arthritis Brother    Hypertension Maternal  Grandmother    Diabetes Maternal Grandmother        borderline   Hypertension Paternal Grandmother    Arthritis Paternal Grandmother    Stroke Paternal Grandmother    Rheum arthritis Paternal Grandfather    Breast cancer Neg Hx    FHx: sister on olanzapine 5mg  and fluoxetine.  Social History   Socioeconomic History   Marital status: Married    Spouse name: Dub Mikes   Number of children: 3   Years of education: College   Highest education level: Not on file  Occupational History   Occupation: Electrical engineer    Comment: Futures trader at [employer] Use   Smoking status: Every Day    Packs/day: 0.50    Types: Cigarettes   Smokeless tobacco: Never  Vaping Use   Vaping Use: Never used  Substance and Sexual Activity   Alcohol use: Yes    Alcohol/week: 1.0 standard drink    Types: 1 Glasses of wine per week    Comment: one drink about once a month   Drug use: No   Sexual activity: Yes    Birth control/protection: Other-see comments    Comment: pt had hyst  Other Topics Concern   Not on file  Social History Narrative   Lives with husband   Social Determinants of Health   Financial Resource Strain: Not on file  Food Insecurity: Not on file  Transportation Needs: Not on file  Physical Activity: Not on file  Stress: Not on file  Social Connections: Not on file  Intimate Partner Violence: Not on file    Past Medical History, Surgical history, Social history, and Family history were reviewed and updated as appropriate.   Please see review of systems for further details on the patient's review from today.   Objective:   Physical Exam:  LMP 05/20/2010   Physical Exam Constitutional:      General: She is not in acute distress.    Appearance: She is well-developed.  Musculoskeletal:        General: No deformity.  Neurological:     Mental Status: She is alert and oriented to person, place, and time.     Cranial Nerves: No dysarthria.     Coordination:  Coordination normal.  Psychiatric:        Attention and Perception: Attention and perception normal. She does not perceive auditory or visual hallucinations.        Mood and Affect: Mood is not anxious or depressed. Affect is not labile, blunt or inappropriate.        Speech: Speech normal.        Behavior: Behavior normal. Behavior is cooperative.        Thought Content: Thought content normal. Thought content is not paranoid or delusional. Thought content does not include homicidal or suicidal ideation.        Cognition and Memory: Cognition and memory normal.        Judgment: Judgment normal.     Comments: Insight intact SI resolved. Dep resolved at present    Lab Review:     Component Value Date/Time   NA 140 03/01/2020 1731   K 3.8 03/01/2020 1731   CL 102 03/01/2020 1731  CO2 25 03/01/2020 1731   GLUCOSE 92 03/01/2020 1731   BUN 9 03/01/2020 1731   CREATININE 1.17 (H) 03/01/2020 1731   CALCIUM 10.0 03/01/2020 1731   PROT 6.8 03/01/2020 1731   ALBUMIN 4.5 03/01/2020 1731   AST 28 03/01/2020 1731   ALT 23 01/21/2021 0957   ALKPHOS 68 03/01/2020 1731   BILITOT 0.8 03/01/2020 1731   GFRNONAA 55 (L) 03/01/2020 1731   GFRAA >60 10/10/2015 1115       Component Value Date/Time   WBC 7.3 03/01/2020 1731   RBC 5.12 (H) 03/01/2020 1731   HGB 16.5 (H) 03/01/2020 1731   HCT 50.9 (H) 03/01/2020 1731   PLT 211 03/01/2020 1731   MCV 99.4 03/01/2020 1731   MCH 32.2 03/01/2020 1731   MCHC 32.4 03/01/2020 1731   RDW 11.9 03/01/2020 1731   LYMPHSABS 1.2 03/01/2020 1731   MONOABS 0.4 03/01/2020 1731   EOSABS 0.1 03/01/2020 1731   BASOSABS 0.1 03/01/2020 1731    Lithium Lvl  Date Value Ref Range Status  03/01/2020 0.41 (L) 0.60 - 1.20 mmol/L Final    Comment:    Performed at [facility], [place] [address] [place], [place] [address]     No results found for: PHENYTOIN, PHENOBARB, VALPROATE, CBMZ   Lithium level received April 27, 2018 was 0.7.  This is probably  adequate to help reduce the risk of relapse of depression post [place].  Therefore no med changes.    Vitamin D level April 27, 2018 on supplement was 60 and adequate. .res Assessment: Plan:    Trust was seen today for follow-up, depression, anxiety, medication problem and sleeping problem.  Diagnoses and all orders for this visit:  Recurrent major depression resistant to treatment ([place])  Generalized anxiety disorder  Insomnia due to mental condition -     ALPRAZolam (XANAX) 0.5 MG tablet; Take 1.5 tablets (0.75 mg total) by mouth at bedtime as needed for anxiety.  Weight gain due to medication -     phentermine 37.5 MG capsule; Take 1 capsule (37.5 mg total) by mouth every morning. -     pioglitazone (ACTOS) 30 MG tablet; Take 1 tablet (30 mg total) by mouth daily.  Low vitamin D level   Greater than 50% of 30 min  face to face time with patient was spent on counseling and coordination of care. 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  Reviewed multiple failed medications noted above.  She failed the attempt to wean Zyprexa because of worsening anxiety.  Anxiety is helped with 2.5 mg olanzapine daily& depression is helped. Lybalvi 5 failed DT weight gain.  Wellbutrin reduced to 300 bc GTC sz of unknown origin.  Was better with 450 mg but OK so far with reduction. SZ WU normal   Overall depression is better controlled that it has been in some time.   She still has some sleep problems despite several meds to help with sleep.  Defer per her request NAC 600 mg or more daily.  She feels her protein intake is adequate and her weight is stable.  Continue dayvigo 10.    Actos 30 mg trial for antipsychotic weight gain likely DT insulin resistance. Disc weight gain.  She is now making some progress with weight loss with a combination of Lybalvi and Actos.  She will recognizes the Actos is off label and has some liver risks. Disc risk of DM with olanzapine.  Energy is better and no depression.   B12 seemed to  help.   Option Aricept DT memory concerns.  She doesn't want more meds.  Her SI is gone after increasing lithium to 450 mg for SI and depression.  Depression resolved.  Discussed potential metabolic side effects associated with atypical antipsychotics, as well as potential risk for movement side effects. Advised pt to contact office if movement side effects occur.   We discussed the short-term risks associated with benzodiazepines including sedation and increased fall risk among others.  Discussed long-term side effect risk including dependence, potential withdrawal symptoms, and the potential eventual dose-related risk of dementia.  But recent studies from 2020 dispute this association between benzodiazepines and dementia risk. Newer studies in 2020 do not support an association with dementia. She wants to try to taper Xanax.  She is aware of potential worsening insomnia.  She was given the following schedule.  My suggestion was to continue current dose bc of insomnia now.  But her option. Take alprazolam 1 and 1/2 of 0.5 mg tablets for 1 week,  Then reduce to 1 tablet nightly for 2-4 weeks, Then reduce to 1/2 tablet nightly for 2-4 weeks then stop   She felt 50% better with last [place] but 100% better with first round [place].  Counseled patient regarding potential benefits, risks, and side effects of lithium to include potential risk of lithium affecting thyroid and renal function.  Discussed need for periodic lab monitoring to determine drug level and to assess for potential adverse effects.  Counseled patient regarding signs and symptoms of lithium toxicity and advised that they notify office immediately or seek urgent medical attention if experiencing these signs and symptoms.  Patient advised to contact office with any questions or concerns.  Disc lithium and toxicity. lithium to morning to eliminate nocturia. Only taking lithium 300 mg and No SI either. 0.3  on 10/03/20 She forgot to  make the switch after the last visit.  Vitamin D 5000 units daily  FU 3 mos  Lynder Parents, MD, DFAPA    Please see After Visit Summary for patient specific instructions.  Future Appointments  Date Time Provider [place]  04/08/2021  1:30 PM Cottle, Billey Co., MD CP-CP None    No orders of the defined types were placed in this encounter.  Start Actos 15 mg once daily with a meal.  If after 2 weeks there is no noticeable benefit and no side effects then increase to 30 mg daily.   Lynder Parents, MD, DFAPA   -------------------------------

## 2021-04-08 ENCOUNTER — Ambulatory Visit: Payer: BC Managed Care – PPO | Admitting: Psychiatry

## 2021-04-21 ENCOUNTER — Telehealth: Payer: Self-pay | Admitting: Psychiatry

## 2021-04-21 NOTE — Telephone Encounter (Signed)
Patient lm requesting to increase her medication. No Medication indicated. # [phone]. Last seen 12/13, No Show 1/4, scheduled for 3/2.

## 2021-04-21 NOTE — Telephone Encounter (Signed)
LVM to rtc 

## 2021-04-22 ENCOUNTER — Other Ambulatory Visit: Payer: Self-pay | Admitting: Psychiatry

## 2021-04-22 NOTE — Telephone Encounter (Signed)
There is no higher dose of phentermine.  If it is not helpful, stop it.  I dont' have any other ideas for weight loss.  I'd suggest she talk to PCP or consider consultation with Dr. Briscoe Deutscher and [facility]

## 2021-04-22 NOTE — Telephone Encounter (Signed)
Pt is requesting to increase phentermine due to it not helping her lose any weight

## 2021-04-22 NOTE — Telephone Encounter (Unsigned)
Pt LVM asking for Traci to call her back after 3:30 pm today.  Next appt 3/2

## 2021-04-22 NOTE — Telephone Encounter (Signed)
Requesting a call from you

## 2021-05-20 ENCOUNTER — Other Ambulatory Visit: Payer: Self-pay | Admitting: Obstetrics and Gynecology

## 2021-05-20 DIAGNOSIS — Z1231 Encounter for screening mammogram for malignant neoplasm of breast: Secondary | ICD-10-CM

## 2021-05-30 ENCOUNTER — Other Ambulatory Visit: Payer: Self-pay | Admitting: Psychiatry

## 2021-05-30 DIAGNOSIS — F339 Major depressive disorder, recurrent, unspecified: Secondary | ICD-10-CM

## 2021-06-02 ENCOUNTER — Ambulatory Visit
Admission: RE | Admit: 2021-06-02 | Discharge: 2021-06-02 | Disposition: A | Discharge status: Home / Self Care | Payer: BC Managed Care – PPO | Source: Ambulatory Visit | Attending: Obstetrics and Gynecology | Admitting: Obstetrics and Gynecology

## 2021-06-02 DIAGNOSIS — Z1231 Encounter for screening mammogram for malignant neoplasm of breast: Secondary | ICD-10-CM

## 2021-06-03 ENCOUNTER — Other Ambulatory Visit: Payer: Self-pay | Admitting: Psychiatry

## 2021-06-03 DIAGNOSIS — F5105 Insomnia due to other mental disorder: Secondary | ICD-10-CM

## 2021-06-04 ENCOUNTER — Encounter: Payer: Self-pay | Admitting: Psychiatry

## 2021-06-04 ENCOUNTER — Ambulatory Visit: Payer: 59 | Admitting: Psychiatry

## 2021-06-04 ENCOUNTER — Other Ambulatory Visit: Payer: Self-pay

## 2021-06-04 DIAGNOSIS — F339 Major depressive disorder, recurrent, unspecified: Secondary | ICD-10-CM | POA: Diagnosis not present

## 2021-06-04 DIAGNOSIS — F5105 Insomnia due to other mental disorder: Secondary | ICD-10-CM

## 2021-06-04 DIAGNOSIS — F411 Generalized anxiety disorder: Secondary | ICD-10-CM | POA: Diagnosis not present

## 2021-06-04 DIAGNOSIS — R635 Abnormal weight gain: Secondary | ICD-10-CM | POA: Diagnosis not present

## 2021-06-04 DIAGNOSIS — T50905A Adverse effect of unspecified drugs, medicaments and biological substances, initial encounter: Secondary | ICD-10-CM

## 2021-06-04 MED ORDER — OLANZAPINE 5 MG PO TABS: 5.0000 mg | ORAL_TABLET | Freq: Every day | ORAL | 0 refills | Status: DC

## 2021-06-04 MED ORDER — DAYVIGO 10 MG PO TABS: 10.0000 mg | ORAL_TABLET | Freq: Every evening | ORAL | 3 refills | Status: DC

## 2021-06-04 MED ORDER — BUPROPION HCL ER (XL) 300 MG PO TB24: 300.0000 mg | ORAL_TABLET | Freq: Every day | ORAL | 1 refills | Status: DC

## 2021-06-04 NOTE — Progress Notes (Signed)
[name] [id number] 01-03-1966 56 y.o.     Subjective:   Patient ID:  [name] is a 56 y.o. (DOB 03-11-1966) female.  Chief Complaint:  Chief Complaint  Patient presents with   Follow-up   Depression   Medication Problem     Depression        Associated symptoms include decreased concentration.  Associated symptoms include no fatigue, no appetite change and no suicidal ideas. Medication Refill Pertinent negatives include no abdominal pain, fatigue or weakness.   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  At visit March 2020.  She had completed [place] early December.  "It worked"  and had resolution of her depression!  She started Wellbutrin XL 300 mg daily for relapse prevention.  She continued olanzapine 2.5 mg every afternoon because her anxiety relapsed when she would try to completely discontinue it.  Stopped lithium DT SE and felt worse and restarted 300 daily.   In may 2020 pt taking lithium 600 mg and CO balance problems.  She stopped lithium and lithium level at 62 hours later was 0.4, suggesting possible prior toxicity so the lithium was reduced to 300 mg.    in June.  Because Wellbutrin had affected her sleep we switched it to Wellbutrin SR 200 mg every morning and encouraged her to reduce caffeine.  She had felt well but Wellbutrin XL 300 mg made her edgy and caused some insomnia.  She was also encouraged to take NAC as a supplement daily.  She called back September 14 stating that she wanted to switch back to Wellbutrin XL 300 mg because she felt that SR 200 mg was not working sufficiently.  She was cautioned about it contributing to insomnia because there was a concern and had done that in the past.  At visit January 15, 2019 the following was noted: Started a new job temporarily which didn't work.  Was so nervous going and needed 1/2 Xanax to go to work.   Couldn't meed the schedule demands Still has balance issues and memory. She reduced the lithium from 300 to 150 mg daily bc she doesn't like taking a lot of meds and husband doesn't either.  She made the change on her own. Restarted Kulm July August 2020 and felt much better.  Seeing [name] at [place].  .  Mostly resolved with this tx and finished end of August.  Was good for a month and then the depression returned.  Kind of avoiding people, not severe.  But had periods of "horrific suicidal thoughts" though comments to safety.   Siister died 01-07-23 COPD and was close to her.   visit Jan 15, 2019 and increased lithium to 450, suggested B12 for energy.  seen March 27, 2019.  The following was noted: Anxiety is helped with 2.5 mg Zyprexa daily.  Option increase nefazodone again.  Some compliance issues. She doesn't like taking so much meds. incr Wellbutrin XL 450 mg AM if tolerated.  Disc SE including more anxiety, insomnia. Trial B12 to see if energy is better in the morning.  Her SI is gone after increasing lithium to 450 mg for SI and depression.  Continue this She continues sleep meds including Belsomra 20 mg nightly Requested lithium level  seen May 08, 2019.  The following was noted Increased Wellbutrin 450 too much jitteriness.  So reduced to 300 mg. Terrible STM. Loses track of thoughts in the middle  of sentences.  Poor penmanship comes and goes. Sometimes when walks right foot sticks to the ground with one fall.   So tired.   SI resolved overnight with increased lithium.  Still having problems with balance.  Some word-finding issues and anxiety.  Energy is better.  Sleep is good at this time. Some benefit with incr Wellbutrin.  Exhausted.  At times has to lay down.  Hard to get up and go work out in the morning.  Lower nefazadone 200 mg did not help her energy and her sleep was worse.     But still tired.  During [place] no depression and no SI but still tired.   Depression  has recurred to a moderate level anxiety is still moderate. Stays busy and active.  Exercising still.  Lost weight with Wellbutrin.  Jobless not good. Anxious at times..  But kids around.  Function is ok.  Doesn't eat much protein.  Eats peanut butter, no eggs, no meat.   Sleeping with Belsomra and Xanax. 9-10 hours CO dry mouth and cavities.  Metal taste in her mouth.  Easily confused.   No meds were changed.  By early 2021 nefazodone was no longer manufactured and then became unavailable to her abruptly.  She called July 11, 2019 and the 2 best options were felt to be either switch to higher dose trazodone though it is probably not as effective as an antidepressant as is nefazodone or the other option Viibryd.  She chose to switch to trazodone because when she discontinued nefazodone her initial chief complaint was insomnia.  07/17/2019 appointment the following is reported:  Hit a wall with abrupt stopping of nefazodone.  Still feels wired. Up at night cleaning.  No wreckless impulsivity.  Head is fuzzy.   Started trazodone 100 mg is helping some. Last 2 nights forgot meds.  Sleep fair with trazodone.  No SE.   Felt wired off nefazodone Down to 1/2 Xanax for 5 days or longer.  Wants to stop it. Don't feel depressed. Lost appetite off nefazodone and losing weight. Recommendation was to increase trazodone gradually to 300 mg nightly if tolerated to help compensate for the absence of nefazodone and hopes of helping depression.  07/30/2019 phone call complaining of muscle weakness. 08/30/2019 phone call asking to increase Wellbutrin from 300 to 450 mg daily.  She had tried this in the past but it made her jittery but it was agreed she could retry it.  09/17/2019 appointment with the following noted: Overall doing well.  Increase trazodone to 300 mg HS and still can't sleep well at times. Feels better with higher dose Wellbutrin and it didn't seem to worsen  Sleep. Tolerating changes except  tremor. No taste for coffee any more.  No smoking in a year.  Wellbutrin helped.  Pilar Plate and kids good.   Better energy and less fatigue.  Less depression. Chronic sleep issues.  No anxiety.  Appetite OK.  Just don't eat like most people but does get protein.  Took a month off nefazodone to adjust.   Plan: Trial of Dayvigo in place of Belsomra at 5-10 mg HS. if it works better call back and let us know and we will change the prescription.  12/20/2019 appt with the following noted: Dayvigo not covered by insurance and made her a little dizzy in AM. Overall satisfied with Belsomra. CO metal taste in her mouth for a long time. CO forgetful and loses train of thought.  Wonders if related to psych treatment  She's reduced trzodone to 100 mg HS.  High doses made her dizzy. No depression.  Boys had Covid. She and H not vaccinated. Plan: No med changes  03/17/2020 appointment with the following noted: Disc recent accidental OD Wellbutrin with doubling dose.  It was horrible on 900 mg Wellbutrin.    Now only taking 300 mg Wellbutrin but H worries.  Better at 450 mg but is relatively OK.  Not sig depressed.  Satisfied for now. 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 Weaned off trazodone and is OK.  Sleep is inconsistent and then tired if that happens.  Asked questions about psychadelics in news for info for mental health problems.  Some anxiety lately. Plan:   No med changes  06/17/2020 appointment with following noted: Went back to Wellbutrin 450 mid January to help depression. No SE Not depressed. Li SE taste pennies comes and goes. Tried again to slowly taper off olanzapine and relapsed again and back on 2.5 mg HS and is better.  Trouble with weight in middle of gut. Lysine helped tongue soreness.   Never got Covid.  Despite family got it.  09/17/20 appt note: No weight loss with metformin. 123.8# today and 5'3" Mental health is good.  Rel with H good. Questions with meds. Trouble with initial and  terminal but mostly awakening.  Not making her anxious. No significant depression or suicidal thoughts since she was here.  The anxiety is manageable with the olanzapine.  Having some nocturia. Plan: Switch lithium to morning to eliminate nocturia. Check lithium level Wean metformin due to the response Anxiety is helped with 2.5 mg Zyprexa daily. Switch to Lybalvi 5 mg bc failure of metformin bc still gaining weight.  The increased dose may also help with sleep.  Anxiety is manageable at present.   12/21/2020 appointment with the following noted,: Trouble with EFA. Dayvigo NR like Belsomra Patient reports stable mood and denies depressed or irritable moods.  Patient denies any recent difficulty with anxiety.   Denies appetite disturbance.  Patient reports that energy and motivation have been good.  Patient denies any difficulty with concentration.  Patient denies any suicidal ideation. Ongoing concerns about weight gain with olanzapine despite exercise and limiting calories. Plan: Trial dayvigo 10 failed.  Trial Quiviviq 50 nightly Metformin wean DT NR.  Actos 15 mg trial for antipsychotic weight gain likely DT insulin resistance. Switch lithium to morning to eliminate nocturia. Only taking lithium 300 mg and No SI either. 0.3  on 10/03/20  01/30/2021 appointment with the following noted: Finally losing some fat with combo Lybalvi & Actos.  Very happy with change. Nocturia 2-5  times but right back to sleep usually.  Not happening now.  Stopped Dwana Curd about 5 days ago.  Doesn't have it and returned to [facility]. Margarita Mail is $45 and Dayvigo $5. Trying to wean off alprazolam and wants to do it. Overall sleep is better.  Anxiety manageable.  Depression under control.  She is making some progress with weight loss Plan: Actos 30 mg trial for antipsychotic weight gain likely DT insulin resistance. She wants to try to taper Xanax.  She is aware of potential worsening insomnia.  She was given the  following schedule. Take alprazolam 1 and 1/2 of 0.5 mg tablets for 1 week,  Then reduce to 1 tablet nightly for 2-4 weeks, Then reduce to 1/2 tablet nightly for 2-4 weeks then stop   03/17/2021 appointment with the following noted: Gained 5 # on Lybalvi 5 so went back to olanzapine  2.5 mg daily. No SE with Actos. Complains sugar craving. Asks about appetite suppressant. Sleep is poor with awakening every 2 hours. Depression managed and anxiety.  One firiend commented about how well she's doing.  Laugh more. Plan: She wants to try to taper Xanax.  She is aware of potential worsening insomnia.  She was given the following schedule.  My suggestion was to continue current dose bc of insomnia now.  But her option. Take alprazolam 1 and 1/2 of 0.5 mg tablets for 1 week,  Then reduce to 1 tablet nightly for 2-4 weeks, Then reduce to 1/2 tablet nightly for 2-4 weeks then stop   06/04/2021 phone call asking to increase phentermine because she is not losing any weight with it.  She was instructed formed there is no higher dose and if is not helpful then discontinue it I do not know of any other options other than seeing a weight loss doctor.  06/04/2021 appointment with the following noted: Still can't lose weight.  Phentermine and Actos did not work. Cut out sugar. Doing ok with mood and anxiety overall.  Occ spells of anxiety. Dub Mikes engaged for marriage. Elberta Fortis 19. 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   No further sz issues and workup negative.   Long history of severe treatment resistant depression having failed multiple medications including imipramine,  paroxeine , nefazodone,   Viibryd, Prozac, venlafaxine, Emsam 9 mg, Wellbutrin 450, Pristiq, , mirtazapine nefazodone 600 sedation, duloxetine 90 mg, Trintellix, lithium 600, lamotrigine ? Response, Deplin, TMS, Latuda, lithium, olanzapine, quetiapine, Lybalvi Rexulti caused NMS, Abilify, Vyvanse,  pramipexole,  ropinirole,  ProSom,  Belsomra lost response,  Dayvigo better,  temazepam, Xanax, doxepin, Lunesta, gabapentin, [id number] Vyvanse,  Provigil, Zofran, and she did respond to ECT.,   NAC  Sister gained weight on olanazapine  Review of Systems:  Review of Systems  Constitutional:  Positive for unexpected weight change. Negative for appetite change and fatigue.  HENT:  Negative for dental problem.   Cardiovascular:  Negative for palpitations.  Gastrointestinal:  Positive for constipation. Negative for abdominal pain.  Neurological:  Negative for tremors and weakness.  Psychiatric/Behavioral:  Positive for decreased concentration and sleep disturbance. Negative for agitation, behavioral problems, confusion, dysphoric mood, hallucinations, self-injury and suicidal ideas. The patient is nervous/anxious. The patient is not hyperactive.    Medications: I have reviewed the patient's current medications.  Current Outpatient Medications  Medication Sig Dispense Refill   ALPRAZolam (XANAX) 0.5 MG tablet Take 1.5 tablets (0.75 mg total) by mouth at bedtime as needed for anxiety. 45 tablet 4   Cholecalciferol (VITAMIN D3) 50 MCG (2000 UT) TABS Take 2,000 Units by mouth daily with breakfast.     Cyanocobalamin ER (B-12 DUAL SPECTRUM) 5000 MCG TBCR Take 1 tablet by mouth daily with breakfast.     lithium carbonate 150 MG capsule TAKE 3 CAPSULES (450 MG TOTAL) BY MOUTH EVERY EVENING. 270 capsule 1   pantoprazole (PROTONIX) 40 MG tablet Take 40 mg by mouth daily.     buPROPion (WELLBUTRIN XL) 300 MG 24 hr tablet Take 1 tablet (300 mg total) by mouth daily. 90 tablet 1   Lemborexant (DAYVIGO) 10 MG TABS Take 10 mg by mouth at bedtime. 30 tablet 3   OLANZapine (ZYPREXA) 5 MG tablet Take 1 tablet (5 mg total) by mouth at bedtime. 90 tablet 0   No current facility-administered medications for this visit.    Medication Side Effects: None  Allergies: No Known Allergies  Past Medical  History:  Diagnosis Date   Abdominal pain     Anal itching    Anxiety    Breast inflammation    Breast nodule    left   Chronic kidney disease    Cystocele    Decreased libido    Depression    Diverticular disease    Eczema    Fibroadenoma    Galactorrhea    IBS (irritable bowel syndrome)    Mastodynia    Pelvic pain in female    Pelvic relaxation    Rectocele    Seizure-like activity (HCC)    Serotonin syndrome     Family History  Problem Relation Age of Onset   Heart disease Mother        angioplasty   Cancer Father        prostate   Hypertension Father    Depression Father    Bipolar disorder Father    Rheum arthritis Sister    Rheum arthritis Brother    Hypertension Maternal Grandmother    Diabetes Maternal Grandmother        borderline   Hypertension Paternal Grandmother    Arthritis Paternal Grandmother    Stroke Paternal Grandmother    Rheum arthritis Paternal Grandfather    Breast cancer Neg Hx    FHx: sister on olanzapine 5mg  and fluoxetine.  Social History   Socioeconomic History   Marital status: Married    Spouse name: Dub Mikes   Number of children: 3   Years of education: College   Highest education level: Not on file  Occupational History   Occupation: Electrical engineer    Comment: Futures trader at [employer] Use   Smoking status: Every Day    Packs/day: 0.50    Types: Cigarettes   Smokeless tobacco: Never  Vaping Use   Vaping Use: Never used  Substance and Sexual Activity   Alcohol use: Yes    Alcohol/week: 1.0 standard drink    Types: 1 Glasses of wine per week    Comment: one drink about once a month   Drug use: No   Sexual activity: Yes    Birth control/protection: Other-see comments    Comment: pt had hyst  Other Topics Concern   Not on file  Social History Narrative   Lives with husband   Social Determinants of Health   Financial Resource Strain: Not on file  Food Insecurity: Not on file  Transportation Needs: Not on file  Physical Activity: Not on file   Stress: Not on file  Social Connections: Not on file  Intimate Partner Violence: Not on file    Past Medical History, Surgical history, Social history, and Family history were reviewed and updated as appropriate.   Please see review of systems for further details on the patient's review from today.   Objective:   Physical Exam:  LMP 05/20/2010   Physical Exam Constitutional:      General: She is not in acute distress.    Appearance: She is well-developed.  Musculoskeletal:        General: No deformity.  Neurological:     Mental Status: She is alert and oriented to person, place, and time.     Cranial Nerves: No dysarthria.     Coordination: Coordination normal.  Psychiatric:        Attention and Perception: Attention and perception normal. She does not perceive auditory or visual hallucinations.        Mood and Affect: Mood is not anxious or  depressed. Affect is not labile, blunt or inappropriate.        Speech: Speech normal.        Behavior: Behavior normal. Behavior is cooperative.        Thought Content: Thought content normal. Thought content is not paranoid or delusional. Thought content does not include homicidal or suicidal ideation. Thought content does not include suicidal plan.        Cognition and Memory: Cognition and memory normal.        Judgment: Judgment normal.     Comments: Insight intact SI resolved. Dep resolved at present    Lab Review:     Component Value Date/Time   NA 140 03/01/2020 1731   K 3.8 03/01/2020 1731   CL 102 03/01/2020 1731   CO2 25 03/01/2020 1731   GLUCOSE 92 03/01/2020 1731   BUN 9 03/01/2020 1731   CREATININE 1.17 (H) 03/01/2020 1731   CALCIUM 10.0 03/01/2020 1731   PROT 6.8 03/01/2020 1731   ALBUMIN 4.5 03/01/2020 1731   AST 28 03/01/2020 1731   ALT 23 01/21/2021 0957   ALKPHOS 68 03/01/2020 1731   BILITOT 0.8 03/01/2020 1731   GFRNONAA 55 (L) 03/01/2020 1731   GFRAA >60 10/10/2015 1115       Component Value  Date/Time   WBC 7.3 03/01/2020 1731   RBC 5.12 (H) 03/01/2020 1731   HGB 16.5 (H) 03/01/2020 1731   HCT 50.9 (H) 03/01/2020 1731   PLT 211 03/01/2020 1731   MCV 99.4 03/01/2020 1731   MCH 32.2 03/01/2020 1731   MCHC 32.4 03/01/2020 1731   RDW 11.9 03/01/2020 1731   LYMPHSABS 1.2 03/01/2020 1731   MONOABS 0.4 03/01/2020 1731   EOSABS 0.1 03/01/2020 1731   BASOSABS 0.1 03/01/2020 1731    Lithium Lvl  Date Value Ref Range Status  03/01/2020 0.41 (L) 0.60 - 1.20 mmol/L Final    Comment:    Performed at [facility], [place] [address], [place], [place] [address]     No results found for: PHENYTOIN, PHENOBARB, VALPROATE, CBMZ   Lithium level received April 27, 2018 was 0.7.  This is probably adequate to help reduce the risk of relapse of depression post [place].  Therefore no med changes.    Vitamin D level April 27, 2018 on supplement was 60 and adequate. .res Assessment: Plan:    Pasty was seen today for follow-up, depression and medication problem.  Diagnoses and all orders for this visit:  Recurrent major depression resistant to treatment ([place]) -     buPROPion (WELLBUTRIN XL) 300 MG 24 hr tablet; Take 1 tablet (300 mg total) by mouth daily. -     OLANZapine (ZYPREXA) 5 MG tablet; Take 1 tablet (5 mg total) by mouth at bedtime.  Generalized anxiety disorder  Insomnia due to mental condition -     Lemborexant (DAYVIGO) 10 MG TABS; Take 10 mg by mouth at bedtime.  Weight gain due to medication    Greater than 50% of 30 min  face to face time with patient was spent on counseling and coordination of care. 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  Reviewed multiple failed medications noted above.  She failed the attempt to wean Zyprexa because of worsening anxiety.  Anxiety is helped with 2.5 mg olanzapine daily& depression is helped. Lybalvi 5 failed DT weight gain.  Wellbutrin reduced to 300 bc GTC sz of unknown origin.  Was better with 450 mg but OK so far with reduction. SZ  WU normal   Overall depression is better controlled that it has been in some time.   She still has some sleep problems despite several meds to help with sleep.  Defer per her request NAC 600 mg or more daily.  She feels her protein intake is adequate and her weight is stable.  Continue dayvigo 10.    DC Actos and phentermine DT NR    Energy is better and no depression.  B12 seemed to help.   Option Aricept DT memory concerns.  She doesn't want more meds.  Her SI is gone after increasing lithium to 450 mg for SI and depression.  Depression resolved.  Discussed potential metabolic side effects associated with atypical antipsychotics, as well as potential risk for movement side effects. Advised pt to contact office if movement side effects occur.   We discussed the short-term risks associated with benzodiazepines including sedation and increased fall risk among others.  Discussed long-term side effect risk including dependence, potential withdrawal symptoms, and the potential eventual dose-related risk of dementia.  But recent studies from 2020 dispute this association between benzodiazepines and dementia risk. Newer studies in 2020 do not support an association with dementia. She wants to try to taper Xanax.  She is aware of potential worsening insomnia.  She was given the following schedule.  My suggestion was to continue current dose bc of insomnia now. \ Option taper Xanax.  She felt 50% better with last [place] but 100% better with first round [place].  Counseled patient regarding potential benefits, risks, and side effects of lithium to include potential risk of lithium affecting thyroid and renal function.  Discussed need for periodic lab monitoring to determine drug level and to assess for potential adverse effects.  Counseled patient regarding signs and symptoms of lithium toxicity and advised that they notify office immediately or seek urgent medical attention if experiencing these signs  and symptoms.  Patient advised to contact office with any questions or concerns.  Disc lithium and toxicity. lithium to morning to eliminate nocturia. Only taking lithium 300 mg and No SI either. 0.3  on 10/03/20 She forgot to make the switch after the last visit.  Vitamin D 5000 units daily  FU 4 mos  Lynder Parents, MD, DFAPA    Please see After Visit Summary for patient specific instructions.  No future appointments.   No orders of the defined types were placed in this encounter.   Start Actos 15 mg once daily with a meal.  If after 2 weeks there is no noticeable benefit and no side effects then increase to 30 mg daily.   Lynder Parents, MD, DFAPA   -------------------------------

## 2021-06-10 ENCOUNTER — Telehealth: Payer: Self-pay | Admitting: Psychiatry

## 2021-06-10 NOTE — Telephone Encounter (Signed)
Can try: ?[facility]  [place] [facility] Address [phone], [place] [address] Get Directions Appointments [phone] ? ?A lot of these weight loss centers are probably pretty booked up.  She will just have to try 1 or the other.  Whenever she calls she needs to ask if the weight loss Center does prescriptions for medications like Saxenda, Wegovy, Ozempic for weight loss.  If they do not she may want to try 1 that does. ?

## 2021-06-10 NOTE — Telephone Encounter (Signed)
Please advise 

## 2021-06-10 NOTE — Telephone Encounter (Signed)
Pt will try calling them but FYI she stated her BMI is 26.5 and [facility] told her they can't take her unless it's over 30

## 2021-06-10 NOTE — Telephone Encounter (Signed)
Pt called reporting referral CC gave for health & weight doctor. It will be 10 mos before she can get appointment. Requesting possible other referral information. Contact # [phone] ?

## 2021-06-15 ENCOUNTER — Telehealth: Payer: Self-pay | Admitting: Psychiatry

## 2021-06-15 NOTE — Telephone Encounter (Signed)
Pt stated she has contacted every weight loss center in [place] and they either don't take insurance or require a large amount of money up front.She did research and wants to know if you can rx Rybelsus

## 2021-06-15 NOTE — Telephone Encounter (Signed)
Please follow up on medication options. Requesting Advice and F/Up on previous message. ?

## 2021-06-15 NOTE — Telephone Encounter (Signed)
No sorry.  I don't have the experience and knowledge to do that ?

## 2021-06-23 NOTE — Telephone Encounter (Signed)
I do not have an alternative.  Of given her what I know to use.  I would suggest she get on the waiting list for the places I recommend.  They may get a cancellation.  The other option is to go to one of the smaller weight loss centers and pay cash.  She can probably get in quicker.  But there is nothing else I can do ?

## 2021-06-23 NOTE — Telephone Encounter (Signed)
Pt informed

## 2021-06-25 ENCOUNTER — Telehealth: Payer: Self-pay | Admitting: Psychiatry

## 2021-06-25 NOTE — Telephone Encounter (Signed)
In December she indicated she was going to try to wean off of the Xanax.  How much Xanax that she taking? ?

## 2021-06-25 NOTE — Telephone Encounter (Signed)
Patient still taking Xanax. She said Dwana Curd didn't work and the [employer] working, but she is still taking it. She is able to get to sleep but only sleeping for about 1-1/2 hours at a time, up and down all night. No new stressors. She feels like she is stuck in a cycle and is worn out.  ?

## 2021-06-25 NOTE — Telephone Encounter (Signed)
Pt called at 10:00 am and said that her sleeping medication is not working. Pleasse call her with other options at [phone]. She said that dr. Clovis Pu is aware of her problem with sleep ?

## 2021-06-26 NOTE — Telephone Encounter (Signed)
Patient is still taking 1.5 tablets of 0.5 mg. Because her sleep has gotten worse she said she couldn't go off of it.  ?

## 2021-06-29 ENCOUNTER — Other Ambulatory Visit: Payer: Self-pay | Admitting: Psychiatry

## 2021-06-29 MED ORDER — CLONAZEPAM 1 MG PO TABS: ORAL_TABLET | ORAL | 0 refills | Status: DC

## 2021-06-29 NOTE — Telephone Encounter (Signed)
I'm sure she has probably taken clonazepam for sleep before but I don't see it in my notes.  I can send in RX for a switch from alprazolam to clonazepam 1.5 mg if she wants to try it again.  Sounds like she's getting tolerant to alprazolam. ?

## 2021-06-29 NOTE — Telephone Encounter (Signed)
Patient notified

## 2021-06-29 NOTE — Telephone Encounter (Signed)
They are both benzodiazepines.  She will not have withdrawal in switching.  Do not combine them. ?

## 2021-06-29 NOTE — Telephone Encounter (Signed)
Rx sent 

## 2021-06-29 NOTE — Telephone Encounter (Signed)
In looking in Epic medication history patient was on clonazepam with 1 RF in 2016/2017, prescribed by Dr. Charlcie Cradle. Patient does not remember how she did with it. Patient is agreeable to trying it. Patient is concerned for any possible reaction/withdrawal from switching the lorazepam to clonazepam.  ? ?[facility] [address].  ?

## 2021-07-07 ENCOUNTER — Telehealth: Payer: Self-pay | Admitting: Psychiatry

## 2021-07-07 NOTE — Telephone Encounter (Signed)
I don't see anything in [facility] for 2023. We don't have samples either.  ?

## 2021-07-07 NOTE — Telephone Encounter (Signed)
Next visit is 09/09/21. [place] called stating that a prior authorization is required on her Dayvigo per [facility] [address]. She does not have anymore of the Dayvigo left. Could she please come by and get some samples until she hears about her prior authorization? Her phone number is [phone]. ?

## 2021-07-08 NOTE — Telephone Encounter (Signed)
Prior Approval received for DAYVIGO 10 MG effective through 07/09/2022 PA# [id number] with [employer] Rx ID# [id number] ? ?Pt is aware and I downloaded a savings card for her.  ?

## 2021-07-08 NOTE — Telephone Encounter (Signed)
Last PA for Dayvigo 10 mg was 2021 with [employer]. Pt has new insurance since then. Will submit PA through updated insurance.  ?

## 2021-07-08 NOTE — Telephone Encounter (Signed)
Contacted pharmacy and they do not keep Dayvigo in stock so it is ordered and should be in tomorrow morning. Her cost will be $10.00, she already had a co-pay card on file. Pt is aware of the delay of receiving the medication ?

## 2021-07-20 ENCOUNTER — Other Ambulatory Visit: Payer: Self-pay | Admitting: Psychiatry

## 2021-07-21 ENCOUNTER — Other Ambulatory Visit: Payer: Self-pay | Admitting: Psychiatry

## 2021-07-30 ENCOUNTER — Other Ambulatory Visit: Payer: Self-pay | Admitting: Psychiatry

## 2021-07-30 DIAGNOSIS — T50905A Adverse effect of unspecified drugs, medicaments and biological substances, initial encounter: Secondary | ICD-10-CM

## 2021-08-04 ENCOUNTER — Telehealth: Payer: Self-pay | Admitting: Psychiatry

## 2021-08-04 NOTE — Telephone Encounter (Signed)
Patient called in for refill on Phentermine '30mg'$ . Ph: [phone] Appt 6/7. Pharmacy [facility] ?

## 2021-08-06 ENCOUNTER — Ambulatory Visit: Payer: 59 | Admitting: Cardiology

## 2021-08-06 ENCOUNTER — Other Ambulatory Visit: Payer: Self-pay | Admitting: Psychiatry

## 2021-08-06 DIAGNOSIS — T50905A Adverse effect of unspecified drugs, medicaments and biological substances, initial encounter: Secondary | ICD-10-CM

## 2021-08-06 MED ORDER — PHENTERMINE HCL 37.5 MG PO CAPS: 37.5000 mg | ORAL_CAPSULE | ORAL | 2 refills | Status: DC

## 2021-08-06 NOTE — Telephone Encounter (Signed)
sent 

## 2021-08-11 ENCOUNTER — Encounter: Payer: Self-pay | Admitting: Cardiology

## 2021-08-11 ENCOUNTER — Ambulatory Visit: Payer: 59 | Admitting: Cardiology

## 2021-08-11 VITALS — BP 136/86 | HR 90 | Temp 98.8°F | Resp 16 | Ht 63.0 in | Wt 139.8 lb

## 2021-08-11 DIAGNOSIS — Z8249 Family history of ischemic heart disease and other diseases of the circulatory system: Secondary | ICD-10-CM

## 2021-08-11 DIAGNOSIS — Z87891 Personal history of nicotine dependence: Secondary | ICD-10-CM

## 2021-08-11 DIAGNOSIS — R0602 Shortness of breath: Secondary | ICD-10-CM

## 2021-08-11 DIAGNOSIS — R9431 Abnormal electrocardiogram [ECG] [EKG]: Secondary | ICD-10-CM

## 2021-08-11 NOTE — Progress Notes (Signed)
? ?ID:  [name], DOB [birth year], MRN [id number] ? ?PCP:  [name]  ?Cardiologist:  Rex Kras, DO, [facility] (established care 08/11/2021) ? ?REASON FOR CONSULT: Shortness of breath ? ?REQUESTING PHYSICIAN:  ?[name], [name] At ?[address] ?[name],  [name] [id number] ? ?Chief Complaint  ?Patient presents with  ? Shortness of Breath  ? New Patient (Initial Visit)  ? ? ?HPI  ?[name] is a 56 y.o. Caucasian female of 56 years whose past medical history and cardiovascular risk factors include: family hx of premature CAD, former smoker (92 yrs, 20 pack year history), anxiety, depression (required hospitalization in past & now well controlled).  ? ?She is referred to the office at the request of [name]* for evaluation of shortness of breath. ? ?Patient states that she has been short of breath for the last 1 year but recently has noticed progression in her overall symptoms.  Now occurring daily.  Usually worse with effort related activities and resolves with rest.  Simple activities such as bending over to tie her shoes or picking up groceries bring on the symptoms.  She denies orthopnea, paroxysmal nocturnal dyspnea or lower extremity swelling.  She does not have angina pectoris with exertional activity. ? ?Mom at age of 69 underwent coronary intervention (per patient).  ? ?FUNCTIONAL STATUS: ?Patient works out at least 4 times a week -includes 30 minutes of treadmill on an incline and some light resistance training. ? ?ALLERGIES: ?No Known Allergies ? ?MEDICATION LIST PRIOR TO VISIT: ?Current Meds  ?Medication Sig  ? buPROPion (WELLBUTRIN XL) 300 MG 24 hr tablet Take 1 tablet (300 mg total) by mouth daily.  ? Cholecalciferol (VITAMIN D3) 50 MCG (2000 UT) TABS Take 2,000 Units by mouth daily with breakfast.  ? clonazePAM (KLONOPIN) 1 MG tablet TAKE 1 TO 1 AND 1/2 TABLETS AT NIGHT FOR SLEEP  ? Cyanocobalamin ER (B-12 DUAL  SPECTRUM) 5000 MCG TBCR Take 1 tablet by mouth daily with breakfast.  ? Lemborexant (DAYVIGO) 10 MG TABS Take 10 mg by mouth at bedtime.  ? lithium carbonate 150 MG capsule TAKE 3 CAPSULES (450 MG TOTAL) BY MOUTH EVERY EVENING.  ? OLANZapine (ZYPREXA) 5 MG tablet Take 1 tablet (5 mg total) by mouth at bedtime.  ? pantoprazole (PROTONIX) 40 MG tablet Take 40 mg by mouth daily.  ? phentermine 37.5 MG capsule Take 1 capsule (37.5 mg total) by mouth every morning.  ?  ? ?PAST MEDICAL HISTORY: ?Past Medical History:  ?Diagnosis Date  ? Abdominal pain   ? Anal itching   ? Anxiety   ? Breast inflammation   ? Breast nodule   ? left  ? Chronic kidney disease   ? Cystocele   ? Decreased libido   ? Depression   ? Diverticular disease   ? Eczema   ? Fibroadenoma   ? Galactorrhea   ? IBS (irritable bowel syndrome)   ? Mastodynia   ? Pelvic pain in female   ? Pelvic relaxation   ? Rectocele   ? Seizure-like activity ([place])   ? Serotonin syndrome   ? ? ?PAST SURGICAL HISTORY: ?Past Surgical History:  ?Procedure Laterality Date  ? ABDOMINAL HYSTERECTOMY    ? partial  ? BREAST BIOPSY    ? HERNIA REPAIR    ? INCONTINENCE SURGERY    ? OVARIAN CYST REMOVAL    ? left  ? ? ?FAMILY HISTORY: ?The patient family history includes Arthritis in her paternal grandmother; Bipolar  disorder in her father; Cancer in her father; Depression in her father; Diabetes in her maternal grandmother; Heart disease in her mother; Hypertension in her father, maternal grandmother, and paternal grandmother; Rheum arthritis in her brother, paternal grandfather, and sister; Stroke in her paternal grandmother. ? ?SOCIAL HISTORY:  ?The patient  reports that she quit smoking about 5 years ago. Her smoking use included cigarettes. She has a 20.00 pack-year smoking history. She has never used smokeless tobacco. She reports current alcohol use of about 1.0 standard drink per week. She reports that she does not use drugs. ? ?REVIEW OF SYSTEMS: ?Review of Systems   ?Cardiovascular:  Positive for dyspnea on exertion. Negative for chest pain, leg swelling, near-syncope, orthopnea, palpitations, paroxysmal nocturnal dyspnea and syncope.  ?Respiratory:  Positive for shortness of breath. Negative for cough.   ? ?PHYSICAL EXAM: ? ?  08/11/2021  ?  3:26 PM 08/11/2021  ?  3:24 PM 12/04/2020  ? 10:59 AM  ?Vitals with BMI  ?Height  '5\' 3"'$  '5\' 3"'$   ?Weight  139 lbs 13 oz 129 lbs  ?BMI  24.77 22.86  ?Systolic 324 401 027  ?Diastolic 86 92 88  ?Pulse 90 88 76  ? ? ?CONSTITUTIONAL: Well-developed and well-nourished. No acute distress.  ?SKIN: Skin is warm and dry. No rash noted. No cyanosis. No pallor. No jaundice ?HEAD: Normocephalic and atraumatic.  ?EYES: No scleral icterus ?MOUTH/THROAT: Moist oral membranes.  ?NECK: No JVD present. No thyromegaly noted. No carotid bruits  ?LYMPHATIC: No visible cervical adenopathy.  ?CHEST Normal respiratory effort. No intercostal retractions  ?LUNGS: Clear to auscultation bilaterally.  No stridor. No wheezes. No rales.  ?CARDIOVASCULAR: Regular rate and rhythm, positive S1-S2, no murmurs rubs or gallops appreciated. ?ABDOMINAL: Soft, nontender, nondistended, positive bowel sounds in all 4 quadrants, no apparent ascites.  ?EXTREMITIES: No peripheral edema, warm to touch, 2+ bilateral DP and PT pulses ?HEMATOLOGIC: No significant bruising ?NEUROLOGIC: Oriented to person, place, and time. Nonfocal. Normal muscle tone.  ?PSYCHIATRIC: Normal mood and affect. Normal behavior. Cooperative ? ?CARDIAC DATABASE: ?EKG: ?08/11/2021: Normal sinus rhythm, 87 bpm, ST depressions in the inferolateral leads suggestive of possible ischemia, without underlying injury pattern. ? ?Echocardiogram: ?No results found for this or any previous visit from the past 1095 days.  ? ?Stress Testing: ?No results found for this or any previous visit from the past 1095 days. ? ? ?Heart Catheterization: ?None ? ?LABORATORY DATA: ? ?External Labs: ?Collected: 07/22/2021. ?Sodium 139, potassium 4.3,  chloride 102, bicarb 32, BUN 17, creatinine 1.08. ?AST 20, ALT 18, alkaline phosphatase 89 ?Total cholesterol 193, triglyceride 94, HDL 58, LDL direct 118, non-HDL 135. ?TSH 1.35 ?Hemoglobin A1c 5.2 ?Hemoglobin 14.8 g/dL, hematocrit 44.4% ? ? ?IMPRESSION: ? ?  ICD-10-CM   ?1. Shortness of breath  R06.02 EKG 12-Lead  ?  PCV ECHOCARDIOGRAM COMPLETE  ?  ?2. Abnormal EKG  R94.31   ?  ?3. Family history of premature CAD  Z82.49 CT CARDIAC SCORING ([facility] LOCATIONS ONLY)  ?  ?4. Former smoker  Z87.891   ?  ?  ? ?RECOMMENDATIONS: ?MARGARETT VITI is a 56 y.o. Caucasian female of 56 years whose past medical history and cardiac risk factors include: family hx of premature CAD, former smoker (54 yrs, 20 pack year history), anxiety, depression (required hospitalization in past & now well controlled).  ? ?She presents today for evaluation of shortness of breath.  I suspect her shortness of breath is multifactorial such as undiagnosed blood pressure, given her significant history of smoking COPD/emphysema cannot  be ruled out, and given her EKG findings and family history of premature CAD ischemic work-up is at least warranted. ? ?Echo will be ordered to evaluate for structural heart disease and left ventricular systolic function. ? ?EKG shows normal sinus rhythm with ST depressions in inferolateral leads for which ischemia cannot be ruled out.  We will start a work-up with a coronary calcium score for further risk stratification.  Depending on the results of her CAC will either proceed with exercise nuclear stress test versus coronary CTA.  Both modalities discussed with the patient at today's office visit. ? ?I have also encouraged her for investing in a blood pressure cuff and checking it regularly and to bring it in in the next office visit for review. ? ?Patient is questioning how and when she should have lung cancer screening given her history of smoking.  I have asked her to discuss this further with PCP. ? ?As part of this  consultation reviewed records including prior office notes available in Care Everywhere, outside labs independently reviewed, ordered and independently reviewed EKG, discussed with the patient differential diagnoses and the

## 2021-08-12 ENCOUNTER — Ambulatory Visit: Payer: 59

## 2021-08-12 DIAGNOSIS — R0602 Shortness of breath: Secondary | ICD-10-CM

## 2021-08-13 ENCOUNTER — Encounter: Payer: Self-pay | Admitting: Psychiatry

## 2021-08-13 ENCOUNTER — Ambulatory Visit: Payer: 59 | Admitting: Psychiatry

## 2021-08-13 VITALS — Wt 140.0 lb

## 2021-08-13 DIAGNOSIS — F5105 Insomnia due to other mental disorder: Secondary | ICD-10-CM

## 2021-08-13 MED ORDER — CHLORPROMAZINE HCL 25 MG PO TABS: 25.0000 mg | ORAL_TABLET | Freq: Every evening | ORAL | 0 refills | Status: DC

## 2021-08-13 NOTE — Progress Notes (Signed)
[name] ?[id number] ?[birth year] ?56 y.o.  ? ? ? ?Subjective:  ? ?Patient ID:  [name] is a 56 y.o. (DOB [birth year]) female. ? ?Chief Complaint:  ?Chief Complaint  ?Patient presents with  ? Follow-up  ? Depression  ? Anxiety  ? Sleeping Problem  ? Medication Problem  ?  Weight gain  ? ? ? ?Depression ?       Associated symptoms include decreased concentration.  Associated symptoms include no fatigue, no appetite change and no suicidal ideas. ?Medication Refill ?Pertinent negatives include no abdominal pain or fatigue.   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 ? ?At visit March 2020.  She had completed [place] early December.  "It worked"  and had resolution of her depression!  She started Wellbutrin XL 300 mg daily for relapse prevention.  She continued olanzapine 2.5 mg every afternoon because her anxiety relapsed when she would try to completely discontinue it.  Stopped lithium DT SE and felt worse and restarted 300 daily.  ? ?In may 2020 pt taking lithium 600 mg and CO balance problems.  She stopped lithium and lithium level at 62 hours later was 0.4, suggesting possible prior toxicity so the lithium was reduced to 300 mg.   ? ?in June.  Because Wellbutrin had affected her sleep we switched it to Wellbutrin SR 200 mg every morning and encouraged her to reduce caffeine.  She had felt well but Wellbutrin XL 300 mg made her edgy and caused some insomnia.  She was also encouraged to take NAC as a supplement daily. ? ?She called back September 14 stating that she wanted to switch back to Wellbutrin XL 300 mg because she felt that SR 200 mg was not working sufficiently.  She was cautioned about it contributing to insomnia because there was a concern and had done that in the past. ? ?At visit January 15, 2019 the following was noted: ?Started a new job temporarily which didn't work.  Was so nervous going and  needed 1/2 Xanax to go to work.  Couldn't meed the schedule demands ?Still has balance issues and memory. ?She reduced the lithium from 300 to 150 mg daily bc she doesn't like taking a lot of meds and husband doesn't either.  She made the change on her own. ?Restarted [place] July August 2020 and felt much better.  Seeing [name] at [facility].  .  Mostly resolved with this tx and finished end of August.  Was good for a month and then the depression returned.  Kind of avoiding people, not severe.  But had periods of "horrific suicidal thoughts" though comments to safety.   Siister died 2023/01/01 COPD and was close to her.   ?visit Jan 15, 2019 and increased lithium to 450, suggested B12 for energy. ? ?seen March 27, 2019.  The following was noted: ?Anxiety is helped with 2.5 mg Zyprexa daily. ? Option increase nefazodone again.  Some compliance issues. She doesn't like taking so much meds. ?incr Wellbutrin XL 450 mg AM if tolerated.  Disc SE including more anxiety, insomnia. ?Trial B12 to see if energy is better in the morning.  ?Her SI is gone after increasing lithium to 450 mg for SI and depression.  Continue this ?She continues sleep meds including Belsomra 20 mg nightly ?Requested lithium level ? ?seen May 08, 2019.  The following was noted ?Increased Wellbutrin 450 too much jitteriness.  So reduced to  300 mg. ?Terrible STM. Loses track of thoughts in the middle of sentences.  Poor penmanship comes and goes. Sometimes when walks right foot sticks to the ground with one fall.   So tired.   ?SI resolved overnight with increased lithium.  Still having problems with balance.  Some word-finding issues and anxiety.  Energy is better.  Sleep is good at this time. Some benefit with incr Wellbutrin.  ?Exhausted.  At times has to lay down.  Hard to get up and go work out in the morning.  ?Lower nefazadone 200 mg did not help her energy and her sleep was worse.   ?  But still tired.  During [place] no depression and no  SI but still tired.   ?Depression has recurred to a moderate level anxiety is still moderate. Stays busy and active.  Exercising still.  Lost weight with Wellbutrin.  Jobless not good. Anxious at times..  But kids around.  Function is ok.  ?Doesn't eat much protein.  Eats peanut butter, no eggs, no meat.   ?Sleeping with Belsomra and Xanax. 9-10 hours ?CO dry mouth and cavities.  Metal taste in her mouth.  Easily confused.   ?No meds were changed. ? ?By early 2021 nefazodone was no longer manufactured and then became unavailable to her abruptly.  She called July 11, 2019 and the 2 best options were felt to be either switch to higher dose trazodone though it is probably not as effective as an antidepressant as is nefazodone or the other option Viibryd.  She chose to switch to trazodone because when she discontinued nefazodone her initial chief complaint was insomnia. ? ?07/17/2019 appointment the following is reported: ? Hit a wall with abrupt stopping of nefazodone.  Still feels wired. Up at night cleaning.  No wreckless impulsivity.  Head is fuzzy.   Started trazodone 100 mg is helping some. ?Last 2 nights forgot meds.  Sleep fair with trazodone.  No SE.   ?Felt wired off nefazodone ?Down to 1/2 Xanax for 5 days or longer.  Wants to stop it. ?Don't feel depressed. ?Lost appetite off nefazodone and losing weight. ?Recommendation was to increase trazodone gradually to 300 mg nightly if tolerated to help compensate for the absence of nefazodone and hopes of helping depression. ? ?07/30/2019 phone call complaining of muscle weakness. ?08/30/2019 phone call asking to increase Wellbutrin from 300 to 450 mg daily.  She had tried this in the past but it made her jittery but it was agreed she could retry it. ? ?09/17/2019 appointment with the following noted: ?Overall doing well.  Increase trazodone to 300 mg HS and still can't sleep well at times. ?Feels better with higher dose Wellbutrin and it didn't seem to worsen   Sleep. ?Tolerating changes except tremor. ?No taste for coffee any more.  No smoking in a year.  Wellbutrin helped.  Pilar Plate and kids good.   ?Better energy and less fatigue.  Less depression. ?Chronic sleep issues.  No anxiety.  Appetite OK.  Just don't eat like most people but does get protein.  ?Took a month off nefazodone to adjust.   ?Plan: Trial of Dayvigo in place of Belsomra at 5-10 mg HS. if it works better call back and let us know and we will change the prescription. ? ?12/20/2019 appt with the following noted: ?Dayvigo not covered by insurance and made her a little dizzy in AM. ?Overall satisfied with Belsomra. ?CO metal taste in her mouth for a long time. ?CO forgetful and  loses train of thought.  Wonders if related to psych treatment ?She's reduced trzodone to 100 mg HS.  High doses made her dizzy. ?No depression.  ?Boys had Covid. ?She and H not vaccinated. ?Plan: No med changes ? ?03/17/2020 appointment with the following noted: ?Disc recent accidental OD Wellbutrin with doubling dose.  It was horrible on 900 mg Wellbutrin.    ?Now only taking 300 mg Wellbutrin but H worries.  Better at 450 mg but is relatively OK.  Not sig depressed.  Satisfied for now. 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 ?Weaned off trazodone and is OK.  Sleep is inconsistent and then tired if that happens.  Asked questions about psychadelics in news for info for mental health problems.  ?Some anxiety lately. ?Plan:   No med changes ? ?06/17/2020 appointment with following noted: ?Went back to Wellbutrin 450 mid January to help depression. No SE ?Not depressed. ?Li SE taste pennies comes and goes. ?Tried again to slowly taper off olanzapine and relapsed again and back on 2.5 mg HS and is better.  Trouble with weight in middle of gut. ?Lysine helped tongue soreness.   ?Never got Covid.  Despite family got it. ? ?09/17/20 appt note: ?No weight loss with metformin. 123.8# today and 5'3" ?Mental health is good.  Rel with H good. ?Questions with  meds. ?Trouble with initial and terminal but mostly awakening.  Not making her anxious. ?No significant depression or suicidal thoughts since she was here.  The anxiety is manageable with the olanzapine.  Hav

## 2021-08-13 NOTE — Patient Instructions (Signed)
Stop phentermine and olanzapine ?Start Vraylar 1 capsule every 3rd day for 2 weeks and if more depressed increase to every other day ?

## 2021-08-14 ENCOUNTER — Telehealth: Payer: Self-pay | Admitting: Psychiatry

## 2021-08-14 NOTE — Telephone Encounter (Signed)
Next visit is 09/29/21. [name] called and was just seen. She was started on Vraylar but wants to make sure she is to continue taking her Wellbutrin with it? Please call her at [phone]. ?

## 2021-08-14 NOTE — Telephone Encounter (Signed)
Patient notified

## 2021-08-14 NOTE — Telephone Encounter (Signed)
Please see message. Your note doesn't mention discontinuing Wellbutrin.  ?

## 2021-08-14 NOTE — Telephone Encounter (Signed)
Yes, she can continue Wellbutrin ? ?

## 2021-08-19 ENCOUNTER — Telehealth: Payer: Self-pay | Admitting: Psychiatry

## 2021-08-19 NOTE — Telephone Encounter (Signed)
Next visit is 09/29/21. [name] wants to know the proper way to take her Vraylar correctly. She just wants to make sure. Her phone number is [phone]. ?

## 2021-08-19 NOTE — Telephone Encounter (Signed)
Went over dosing information as described in last note and she states she understands.  ?

## 2021-08-26 ENCOUNTER — Other Ambulatory Visit: Payer: Self-pay

## 2021-09-09 ENCOUNTER — Ambulatory Visit: Payer: 59 | Admitting: Psychiatry

## 2021-09-10 ENCOUNTER — Telehealth: Payer: Self-pay | Admitting: Psychiatry

## 2021-09-10 ENCOUNTER — Other Ambulatory Visit: Payer: Self-pay

## 2021-09-10 MED ORDER — CLONAZEPAM 1 MG PO TABS: ORAL_TABLET | ORAL | 3 refills | Status: DC

## 2021-09-10 NOTE — Telephone Encounter (Signed)
Pended.

## 2021-09-10 NOTE — Telephone Encounter (Signed)
[name] called this morning at 11:15 to request refill of her Klonopin.  Appt 09/29/21.  [facility] to [facility].

## 2021-09-14 ENCOUNTER — Ambulatory Visit: Payer: 59 | Admitting: Cardiology

## 2021-09-16 ENCOUNTER — Encounter: Payer: Self-pay | Admitting: Psychiatry

## 2021-09-16 ENCOUNTER — Ambulatory Visit: Payer: 59 | Admitting: Psychiatry

## 2021-09-16 DIAGNOSIS — R635 Abnormal weight gain: Secondary | ICD-10-CM

## 2021-09-16 DIAGNOSIS — F5105 Insomnia due to other mental disorder: Secondary | ICD-10-CM

## 2021-09-16 DIAGNOSIS — F411 Generalized anxiety disorder: Secondary | ICD-10-CM | POA: Diagnosis not present

## 2021-09-16 DIAGNOSIS — R7989 Other specified abnormal findings of blood chemistry: Secondary | ICD-10-CM

## 2021-09-16 DIAGNOSIS — F339 Major depressive disorder, recurrent, unspecified: Secondary | ICD-10-CM

## 2021-09-16 DIAGNOSIS — T50905A Adverse effect of unspecified drugs, medicaments and biological substances, initial encounter: Secondary | ICD-10-CM

## 2021-09-16 MED ORDER — TOPIRAMATE 25 MG PO TABS: 25.0000 mg | ORAL_TABLET | Freq: Two times a day (BID) | ORAL | 1 refills | Status: DC

## 2021-09-16 MED ORDER — CARIPRAZINE HCL 1.5 MG PO CAPS: 1.5000 mg | ORAL_CAPSULE | Freq: Every day | ORAL | 1 refills | Status: DC

## 2021-09-16 NOTE — Patient Instructions (Signed)
Dr. Briscoe Deutscher [facility] [address] [place], [place] [address] Call us [phone]

## 2021-09-16 NOTE — Progress Notes (Signed)
[name] [id number] Mar 09, 1966 56 y.o.     Subjective:   Patient ID:  [name] is a 56 y.o. (DOB Aug 15, 1965) female.  Chief Complaint:  Chief Complaint  Patient presents with   Follow-up   Depression   Anxiety   Sleeping Problem     Depression        Associated symptoms include decreased concentration.  Associated symptoms include no fatigue, no appetite change and no suicidal ideas.  Past medical history includes anxiety.   Medication Refill Pertinent negatives include no abdominal pain or fatigue.  Anxiety Symptoms include decreased concentration and nervous/anxious behavior. Patient reports no confusion, palpitations or suicidal ideas.      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  At visit March 2020.  She had completed [place] early December.  "It worked"  and had resolution of her depression!  She started Wellbutrin XL 300 mg daily for relapse prevention.  She continued olanzapine 2.5 mg every afternoon because her anxiety relapsed when she would try to completely discontinue it.  Stopped lithium DT SE and felt worse and restarted 300 daily.   In may 2020 pt taking lithium 600 mg and CO balance problems.  She stopped lithium and lithium level at 62 hours later was 0.4, suggesting possible prior toxicity so the lithium was reduced to 300 mg.    in June.  Because Wellbutrin had affected her sleep we switched it to Wellbutrin SR 200 mg every morning and encouraged her to reduce caffeine.  She had felt well but Wellbutrin XL 300 mg made her edgy and caused some insomnia.  She was also encouraged to take NAC as a supplement daily.  She called back September 14 stating that she wanted to switch back to Wellbutrin XL 300 mg because she felt that SR 200 mg was not working sufficiently.  She was cautioned about it contributing to insomnia because there was a concern and had  done that in the past.  At visit January 15, 2019 the following was noted: Started a new job temporarily which didn't work.  Was so nervous going and needed 1/2 Xanax to go to work.  Couldn't meed the schedule demands Still has balance issues and memory. She reduced the lithium from 300 to 150 mg daily bc she doesn't like taking a lot of meds and husband doesn't either.  She made the change on her own. Restarted [place] July August 2020 and felt much better.  Seeing [name] at [place].  .  Mostly resolved with this tx and finished end of August.  Was good for a month and then the depression returned.  Kind of avoiding people, not severe.  But had periods of "horrific suicidal thoughts" though comments to safety.   Siister died January 17, 2023 COPD and was close to her.   visit Jan 15, 2019 and increased lithium to 450, suggested B12 for energy.  seen March 27, 2019.  The following was noted: Anxiety is helped with 2.5 mg Zyprexa daily.  Option increase nefazodone again.  Some compliance issues. She doesn't like taking so much meds. incr Wellbutrin XL 450 mg AM if tolerated.  Disc SE including more anxiety, insomnia. Trial B12 to see if energy is better in the morning.  Her SI is gone after increasing lithium to 450 mg for SI and depression.  Continue this She continues sleep meds including Belsomra 20 mg nightly Requested lithium level  seen May 08, 2019.  The following was noted Increased Wellbutrin 450 too much jitteriness.  So reduced to 300 mg. Terrible STM. Loses track of thoughts in the middle of sentences.  Poor penmanship comes and goes. Sometimes when walks right foot sticks to the ground with one fall.   So tired.   SI resolved overnight with increased lithium.  Still having problems with balance.  Some word-finding issues and anxiety.  Energy is better.  Sleep is good at this time. Some benefit with incr Wellbutrin.  Exhausted.  At times has to lay down.  Hard to get up and go  work out in the morning.  Lower nefazadone 200 mg did not help her energy and her sleep was worse.     But still tired.  During [place] no depression and no SI but still tired.   Depression has recurred to a moderate level anxiety is still moderate. Stays busy and active.  Exercising still.  Lost weight with Wellbutrin.  Jobless not good. Anxious at times..  But kids around.  Function is ok.  Doesn't eat much protein.  Eats peanut butter, no eggs, no meat.   Sleeping with Belsomra and Xanax. 9-10 hours CO dry mouth and cavities.  Metal taste in her mouth.  Easily confused.   No meds were changed.  By early 2021 nefazodone was no longer manufactured and then became unavailable to her abruptly.  She called July 11, 2019 and the 2 best options were felt to be either switch to higher dose trazodone though it is probably not as effective as an antidepressant as is nefazodone or the other option Viibryd.  She chose to switch to trazodone because when she discontinued nefazodone her initial chief complaint was insomnia.  07/17/2019 appointment the following is reported:  Hit a wall with abrupt stopping of nefazodone.  Still feels wired. Up at night cleaning.  No wreckless impulsivity.  Head is fuzzy.   Started trazodone 100 mg is helping some. Last 2 nights forgot meds.  Sleep fair with trazodone.  No SE.   Felt wired off nefazodone Down to 1/2 Xanax for 5 days or longer.  Wants to stop it. Don't feel depressed. Lost appetite off nefazodone and losing weight. Recommendation was to increase trazodone gradually to 300 mg nightly if tolerated to help compensate for the absence of nefazodone and hopes of helping depression.  07/30/2019 phone call complaining of muscle weakness. 08/30/2019 phone call asking to increase Wellbutrin from 300 to 450 mg daily.  She had tried this in the past but it made her jittery but it was agreed she could retry it.  09/17/2019 appointment with the following noted: Overall doing  well.  Increase trazodone to 300 mg HS and still can't sleep well at times. Feels better with higher dose Wellbutrin and it didn't seem to worsen  Sleep. Tolerating changes except tremor. No taste for coffee any more.  No smoking in a year.  Wellbutrin helped.  Pilar Plate and kids good.   Better energy and less fatigue.  Less depression. Chronic sleep issues.  No anxiety.  Appetite OK.  Just don't eat like most people but does get protein.  Took a month off nefazodone to adjust.   Plan: Trial of Dayvigo in place of Belsomra at 5-10 mg HS. if it works better call back and let us know and we will change the prescription.  12/20/2019 appt with the following noted: Dayvigo not covered by insurance and made her a little dizzy  in AM. Overall satisfied with Belsomra. CO metal taste in her mouth for a long time. CO forgetful and loses train of thought.  Wonders if related to psych treatment She's reduced trzodone to 100 mg HS.  High doses made her dizzy. No depression.  Boys had Covid. She and H not vaccinated. Plan: No med changes  03/17/2020 appointment with the following noted: Disc recent accidental OD Wellbutrin with doubling dose.  It was horrible on 900 mg Wellbutrin.    Now only taking 300 mg Wellbutrin but H worries.  Better at 450 mg but is relatively OK.  Not sig depressed.  Satisfied for now. 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 Weaned off trazodone and is OK.  Sleep is inconsistent and then tired if that happens.  Asked questions about psychadelics in news for info for mental health problems.  Some anxiety lately. Plan:   No med changes  06/17/2020 appointment with following noted: Went back to Wellbutrin 450 mid January to help depression. No SE Not depressed. Li SE taste pennies comes and goes. Tried again to slowly taper off olanzapine and relapsed again and back on 2.5 mg HS and is better.  Trouble with weight in middle of gut. Lysine helped tongue soreness.   Never got Covid.  Despite  family got it.  09/17/20 appt note: No weight loss with metformin. 123.8# today and 5'3" Mental health is good.  Rel with H good. Questions with meds. Trouble with initial and terminal but mostly awakening.  Not making her anxious. No significant depression or suicidal thoughts since she was here.  The anxiety is manageable with the olanzapine.  Having some nocturia. Plan: Switch lithium to morning to eliminate nocturia. Check lithium level Wean metformin due to the response Anxiety is helped with 2.5 mg Zyprexa daily. Switch to Lybalvi 5 mg bc failure of metformin bc still gaining weight.  The increased dose may also help with sleep.  Anxiety is manageable at present.   12/21/2020 appointment with the following noted,: Trouble with EFA. Dayvigo NR like Belsomra Patient reports stable mood and denies depressed or irritable moods.  Patient denies any recent difficulty with anxiety.   Denies appetite disturbance.  Patient reports that energy and motivation have been good.  Patient denies any difficulty with concentration.  Patient denies any suicidal ideation. Ongoing concerns about weight gain with olanzapine despite exercise and limiting calories. Plan: Trial dayvigo 10 failed.  Trial Quiviviq 50 nightly Metformin wean DT NR.  Actos 15 mg trial for antipsychotic weight gain likely DT insulin resistance. Switch lithium to morning to eliminate nocturia. Only taking lithium 300 mg and No SI either. 0.3  on 10/03/20  01/30/2021 appointment with the following noted: Finally losing some fat with combo Lybalvi & Actos.  Very happy with change. Nocturia 2-5  times but right back to sleep usually.  Not happening now.  Stopped Dwana Curd about 5 days ago.  Doesn't have it and returned to [facility]. Margarita Mail is $45 and Dayvigo $5. Trying to wean off alprazolam and wants to do it. Overall sleep is better.  Anxiety manageable.  Depression under control.  She is making some progress with weight loss Plan:  Actos 30 mg trial for antipsychotic weight gain likely DT insulin resistance. She wants to try to taper Xanax.  She is aware of potential worsening insomnia.  She was given the following schedule. Take alprazolam 1 and 1/2 of 0.5 mg tablets for 1 week,  Then reduce to 1 tablet nightly for 2-4 weeks,  Then reduce to 1/2 tablet nightly for 2-4 weeks then stop   03/17/2021 appointment with the following noted: Gained 5 # on Lybalvi 5 so went back to olanzapine 2.5 mg daily. No SE with Actos. Complains sugar craving. Asks about appetite suppressant. Sleep is poor with awakening every 2 hours. Depression managed and anxiety.  One firiend commented about how well she's doing.  Laugh more. Plan: She wants to try to taper Xanax.  She is aware of potential worsening insomnia.  She was given the following schedule.  My suggestion was to continue current dose bc of insomnia now.  But her option. Take alprazolam 1 and 1/2 of 0.5 mg tablets for 1 week,  Then reduce to 1 tablet nightly for 2-4 weeks, Then reduce to 1/2 tablet nightly for 2-4 weeks then stop   06/04/2021 phone call asking to increase phentermine because she is not losing any weight with it.  She was instructed formed there is no higher dose and if is not helpful then discontinue it I do not know of any other options other than seeing a weight loss doctor.  06/04/2021 appointment with the following noted: Still can't lose weight.  Phentermine and Actos did not work. Cut out sugar. Doing ok with mood and anxiety overall.  Occ spells of anxiety. Dub Mikes engaged for marriage. Elberta Fortis 19. 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   No further sz issues and workup negative. Plan: DC phentermine due to no response  06/25/2021 phone call complaining of insomnia.  She wanted to try clonazepam again.  Alprazolam switched to clonazepam 1.5 mg nightly  07/07/2021 wanting to continue Dayvigo. 08/04/2021 phone call wanting refill on  phentermine  08/13/2021 appointment with the following noted: Sleep is awful.  Klonopin is close to Xanax. Upset over weight gain which is now causing hypertension.  Upset getting into weight clinic is hard. Residual depression Plan: DC dayvigo 10 DT lost response For TR insomnia trial off label thorazine 25-50 mg HS.   DC phentermine DT NR DC olanzapine.  Disc risk SI or worsening depression. Vraylar 1.5 mg every 3rd day.  09/16/2021 appointment following noted: No withdrawal off olanzapine and didn't feel depressed.  No problems off it so far. She questions weight gain 20# in a little over a year. BMI at 28 now. No change in weight in last month so far.  No change in appetite and definitely not overeating.  Not sig craving for food or problematic appetite. Asks about Wegovy or Ozempic. Disc Contrave.   No worsening anxiety. Sleep varies.  Thorazine alone does not help but with clonazepam it will sometimes work. Sleeps well at mother's on leather couch.   Long history of severe treatment resistant depression having failed multiple medications including imipramine,  paroxeine , nefazodone,   Viibryd, Prozac, venlafaxine, Emsam 9 mg, Wellbutrin 450, Pristiq, , mirtazapine nefazodone 600 sedation, duloxetine 90 mg, Trintellix, lithium 600, lamotrigine ? Response, Deplin, TMS, Latuda, lithium, olanzapine, quetiapine, Lybalvi Rexulti caused NMS, Abilify,  Vyvanse,  pramipexole,  ropinirole,  ProSom, Belsomra lost response,  Dayvigo better,  temazepam, Xanax, doxepin, Lunesta, gabapentin, CBJSEGBT no response Vyvanse,  Provigil, Zofran, and she did respond to ECT.,   NAC She felt 50% better with last [place] but 100% better with first round [place].  Sister gained weight on olanazapine  Review of Systems:  Review of Systems  Constitutional:  Positive for unexpected weight change. Negative for appetite change and fatigue.  HENT:  Negative for dental problem.  Cardiovascular:  Negative for  palpitations.  Gastrointestinal:  Positive for constipation. Negative for abdominal pain.  Neurological:  Negative for tremors.  Psychiatric/Behavioral:  Positive for decreased concentration and sleep disturbance. Negative for agitation, behavioral problems, confusion, dysphoric mood, hallucinations, self-injury and suicidal ideas. The patient is nervous/anxious. The patient is not hyperactive.     Medications: I have reviewed the patient's current medications.  Current Outpatient Medications  Medication Sig Dispense Refill   buPROPion (WELLBUTRIN XL) 300 MG 24 hr tablet Take 1 tablet (300 mg total) by mouth daily. 90 tablet 1   cariprazine (VRAYLAR) 1.5 MG capsule Take 1 capsule (1.5 mg total) by mouth daily. 30 capsule 1   chlorproMAZINE (THORAZINE) 25 MG tablet Take 1-2 tablets (25-50 mg total) by mouth at bedtime. 45 tablet 0   Cholecalciferol (VITAMIN D3) 50 MCG (2000 UT) TABS Take 2,000 Units by mouth daily with breakfast.     clonazePAM (KLONOPIN) 1 MG tablet TAKE 1 TO 1 AND 1/2 TABLETS AT NIGHT FOR SLEEP 45 tablet 3   Cyanocobalamin ER (B-12 DUAL SPECTRUM) 5000 MCG TBCR Take 1 tablet by mouth daily with breakfast.     lithium carbonate 150 MG capsule TAKE 3 CAPSULES (450 MG TOTAL) BY MOUTH EVERY EVENING. 270 capsule 1   pantoprazole (PROTONIX) 40 MG tablet Take 40 mg by mouth daily.     topiramate (TOPAMAX) 25 MG tablet Take 1 tablet (25 mg total) by mouth 2 (two) times daily. 120 tablet 1   No current facility-administered medications for this visit.    Medication Side Effects: None  Allergies: No Known Allergies  Past Medical History:  Diagnosis Date   Abdominal pain    Anal itching    Anxiety    Breast inflammation    Breast nodule    left   Chronic kidney disease    Cystocele    Decreased libido    Depression    Diverticular disease    Eczema    Fibroadenoma    Galactorrhea    IBS (irritable bowel syndrome)    Mastodynia    Pelvic pain in female    Pelvic  relaxation    Rectocele    Seizure-like activity (HCC)    Serotonin syndrome     Family History  Problem Relation Age of Onset   Heart disease Mother        angioplasty   Cancer Father        prostate   Hypertension Father    Depression Father    Bipolar disorder Father    Rheum arthritis Sister    Rheum arthritis Brother    Hypertension Maternal Grandmother    Diabetes Maternal Grandmother        borderline   Hypertension Paternal Grandmother    Arthritis Paternal Grandmother    Stroke Paternal Grandmother    Rheum arthritis Paternal Grandfather    Breast cancer Neg Hx    FHx: sister on olanzapine '5mg'$  and fluoxetine.  Social History   Socioeconomic History   Marital status: Married    Spouse name: Dub Mikes   Number of children: 3   Years of education: College   Highest education level: Not on file  Occupational History   Occupation: Electrical engineer    Comment: Futures trader at [employer] Use   Smoking status: Former    Packs/day: 0.50    Years: 40.00    Total pack years: 20.00    Types: Cigarettes    Quit date: 2018  Years since quitting: 5.4   Smokeless tobacco: Never  Vaping Use   Vaping Use: Never used  Substance and Sexual Activity   Alcohol use: Yes    Alcohol/week: 1.0 standard drink of alcohol    Types: 1 Glasses of wine per week    Comment: one drink about once a month   Drug use: No   Sexual activity: Yes    Birth control/protection: Other-see comments    Comment: pt had hyst  Other Topics Concern   Not on file  Social History Narrative   Lives with husband   Social Determinants of Health   Financial Resource Strain: Not on file  Food Insecurity: Not on file  Transportation Needs: Not on file  Physical Activity: Not on file  Stress: Not on file  Social Connections: Not on file  Intimate Partner Violence: Not on file    Past Medical History, Surgical history, Social history, and Family history were reviewed and updated as  appropriate.   Please see review of systems for further details on the patient's review from today.   Objective:   Physical Exam:  LMP 05/20/2010   Physical Exam Constitutional:      General: She is not in acute distress.    Appearance: She is well-developed.  Musculoskeletal:        General: No deformity.  Neurological:     Mental Status: She is alert and oriented to person, place, and time.     Cranial Nerves: No dysarthria.     Coordination: Coordination normal.  Psychiatric:        Attention and Perception: Attention and perception normal. She does not perceive auditory or visual hallucinations.        Mood and Affect: Mood is not anxious or depressed. Affect is not labile, blunt or inappropriate.        Speech: Speech normal.        Behavior: Behavior normal. Behavior is cooperative.        Thought Content: Thought content normal. Thought content is not paranoid or delusional. Thought content does not include homicidal or suicidal ideation. Thought content does not include suicidal plan.        Cognition and Memory: Cognition and memory normal.        Judgment: Judgment normal.     Comments: Insight intact SI resolved. Dep resolved at present     Lab Review:     Component Value Date/Time   NA 140 03/01/2020 1731   K 3.8 03/01/2020 1731   CL 102 03/01/2020 1731   CO2 25 03/01/2020 1731   GLUCOSE 92 03/01/2020 1731   BUN 9 03/01/2020 1731   CREATININE 1.17 (H) 03/01/2020 1731   CALCIUM 10.0 03/01/2020 1731   PROT 6.8 03/01/2020 1731   ALBUMIN 4.5 03/01/2020 1731   AST 28 03/01/2020 1731   ALT 23 01/21/2021 0957   ALKPHOS 68 03/01/2020 1731   BILITOT 0.8 03/01/2020 1731   GFRNONAA 55 (L) 03/01/2020 1731   GFRAA >60 10/10/2015 1115       Component Value Date/Time   WBC 7.3 03/01/2020 1731   RBC 5.12 (H) 03/01/2020 1731   HGB 16.5 (H) 03/01/2020 1731   HCT 50.9 (H) 03/01/2020 1731   PLT 211 03/01/2020 1731   MCV 99.4 03/01/2020 1731   MCH 32.2 03/01/2020  1731   MCHC 32.4 03/01/2020 1731   RDW 11.9 03/01/2020 1731   LYMPHSABS 1.2 03/01/2020 1731   MONOABS 0.4 03/01/2020 1731   EOSABS 0.1  03/01/2020 1731   BASOSABS 0.1 03/01/2020 1731    Lithium Lvl  Date Value Ref Range Status  03/01/2020 0.41 (L) 0.60 - 1.20 mmol/L Final    Comment:    Performed at [facility], [place] [address], [place], [place] [address]     No results found for: "PHENYTOIN", "PHENOBARB", "VALPROATE", "CBMZ"   Lithium level received April 27, 2018 was 0.7.  This is probably adequate to help reduce the risk of relapse of depression post [place].  Therefore no med changes.    Vitamin D level April 27, 2018 on supplement was 60 and adequate. .res Assessment: Plan:    Alizaya was seen today for follow-up, depression, anxiety and sleeping problem.  Diagnoses and all orders for this visit:  Recurrent major depression resistant to treatment ([place]) -     cariprazine (VRAYLAR) 1.5 MG capsule; Take 1 capsule (1.5 mg total) by mouth daily.  Weight gain due to medication -     topiramate (TOPAMAX) 25 MG tablet; Take 1 tablet (25 mg total) by mouth 2 (two) times daily.  Insomnia due to mental condition  Generalized anxiety disorder  Low vitamin D level    Greater than 50% of 30 min  face to face time with patient was spent on counseling and coordination of care. 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  Reviewed multiple failed medications noted above.  She failed the attempt to wean Zyprexa because of worsening anxiety.  Until switch to Arman Filter has been ok so far.    Wellbutrin reduced to 300 bc GTC sz of unknown origin.  Was better with 450 mg but OK so far with reduction. SZ WU normal   Overall depression is OK, but upset over her weight.  Defer per her request NAC 600 mg or more daily.  She feels her protein intake is adequate and her weight is stable.  She still has some sleep problems despite several meds to help with sleep. For TR insomnia trial off label  thorazine 25-50 mg HS has been helpful.    DC phentermine DT [facility] [place], [place] [address] Call us [phone] Answered questions about  Madaline Brilliant trial topomax off label for weight but likely won't work but could  Energy is better and no depression.  B12 seemed to help.   Option Aricept DT memory concerns.  She doesn't want more meds.  Her SI is gone after increasing lithium to 450 mg for SI and depression.  Depression resolved.  Discussed potential metabolic side effects associated with atypical antipsychotics, as well as potential risk for movement side effects. Advised pt to contact office if movement side effects occur.  Disc risk NMS with Vraylar, but she has done fine with switch so far.  Vraylar 1.5 mg every 3rd day.  We discussed the short-term risks associated with benzodiazepines including sedation and increased fall risk among others.  Discussed long-term side effect risk including dependence, potential withdrawal symptoms, and the potential eventual dose-related risk of dementia.  But recent studies from 2020 dispute this association between benzodiazepines and dementia risk. Newer studies in 2020 do not support an association with dementia. She wants to try to taper Xanax.  She is aware of potential worsening insomnia.  She was given the following schedule.  My suggestion was to continue current dose bc of insomnia now. \ Option taper Xanax.  Counseled patient regarding potential benefits, risks, and side effects of lithium to include potential risk of lithium affecting  thyroid and renal function.  Discussed need for periodic lab monitoring to determine drug level and to assess for potential adverse effects.  Counseled patient regarding signs and symptoms of lithium toxicity and advised that they notify office immediately or seek urgent medical attention if experiencing these signs and symptoms.  Patient advised to contact office with  any questions or concerns.  Disc lithium and toxicity. lithium to morning to eliminate nocturia. Only taking lithium 300 mg and No SI either. 0.3  on 10/03/20 She forgot to make the switch after the last visit.  Vitamin D 5000 units daily  FU 2 mos  Lynder Parents, MD, DFAPA  Please see After Visit Summary for patient specific instructions.   Future Appointments  Date Time Provider [place]  09/29/2021  3:30 PM Cottle, Billey Co., MD CP-CP None  10/01/2021 12:00 PM [facility] 1 [facility]  10/09/2021 11:45 AM Tolia, Sunit, DO PCV-PCV None     No orders of the defined types were placed in this encounter.      Lynder Parents, MD, DFAPA   -------------------------------

## 2021-09-18 ENCOUNTER — Other Ambulatory Visit: Payer: Self-pay | Admitting: Psychiatry

## 2021-09-18 DIAGNOSIS — F5105 Insomnia due to other mental disorder: Secondary | ICD-10-CM

## 2021-09-29 ENCOUNTER — Telehealth: Payer: Self-pay | Admitting: Psychiatry

## 2021-09-29 ENCOUNTER — Telehealth: Payer: Self-pay | Admitting: Psychology

## 2021-09-29 ENCOUNTER — Ambulatory Visit: Payer: 59 | Admitting: Psychiatry

## 2021-09-29 NOTE — Telephone Encounter (Signed)
Pt called asking if she is a candidate for Retatrutide it's for people with BMI of 27 or greater. Contact pt @ [phone]

## 2021-09-30 NOTE — Telephone Encounter (Signed)
LVM with info

## 2021-10-01 ENCOUNTER — Ambulatory Visit
Admission: RE | Admit: 2021-10-01 | Discharge: 2021-10-01 | Disposition: A | Discharge status: Home / Self Care | Payer: No Typology Code available for payment source | Source: Ambulatory Visit | Attending: Cardiology | Admitting: Cardiology

## 2021-10-01 DIAGNOSIS — Z8249 Family history of ischemic heart disease and other diseases of the circulatory system: Secondary | ICD-10-CM

## 2021-10-09 ENCOUNTER — Encounter: Payer: Self-pay | Admitting: Cardiology

## 2021-10-09 ENCOUNTER — Ambulatory Visit: Payer: 59 | Admitting: Cardiology

## 2021-10-09 VITALS — BP 137/84 | HR 79 | Temp 98.0°F | Resp 16 | Ht 63.0 in | Wt 135.6 lb

## 2021-10-09 DIAGNOSIS — R0602 Shortness of breath: Secondary | ICD-10-CM

## 2021-10-09 DIAGNOSIS — R9431 Abnormal electrocardiogram [ECG] [EKG]: Secondary | ICD-10-CM

## 2021-10-09 DIAGNOSIS — Z87891 Personal history of nicotine dependence: Secondary | ICD-10-CM

## 2021-10-09 DIAGNOSIS — Z8249 Family history of ischemic heart disease and other diseases of the circulatory system: Secondary | ICD-10-CM

## 2021-10-09 NOTE — Progress Notes (Signed)
ID:  [name], DOB Sep 10, 1965, MRN [id number]  PCP:  [facility]  Cardiologist:  Rex Kras, DO, [facility] (established care 08/11/2021)  Date: 10/09/21 Last Office Visit: 08/11/2021  Chief Complaint  Patient presents with   Shortness of Breath   Results   Follow-up    4 weeks    HPI  [name] is a 56 y.o. Caucasian female whose past medical history and cardiovascular risk factors include: family hx of premature CAD, former smoker (41 yrs, 20 pack year history), anxiety, depression (required hospitalization in past & now well controlled).   She was referred to the practice for evaluation of shortness of breath which was felt to be multifactorial given her elevated blood pressures, significant history of smoking (for which COPD/emphysema cannot be ruled out), we also discussed ischemic work-up given her family history of premature CAD and EKG findings of ST depression in the inferolateral leads.  Since last office visit she underwent an echocardiogram which notes preserved LVEF, normal diastolic function and mild/moderate valvular heart disease.  Her total coronary calcium score is 0.  Clinically patient states that her shortness of breath remains relatively stable.  More pronounced with leaning forward while tying her shoes.  It is nonexertional.  She continues to exercise at least 30 minutes 4 days a week on a treadmill with an incline plane no change in overall physical endurance.  She denies any anginal discomfort at rest or with effort related activities.  Mom at age of 80 underwent coronary intervention (per patient).   FUNCTIONAL STATUS: Patient works out at least 4 times a week -includes 30 minutes of treadmill on an incline and some light resistance training.  ALLERGIES: No Known Allergies  MEDICATION LIST PRIOR TO VISIT: Current Meds  Medication Sig   buPROPion (WELLBUTRIN XL) 300 MG 24 hr tablet Take 1 tablet (300 mg  total) by mouth daily.   cariprazine (VRAYLAR) 1.5 MG capsule Take 1 capsule (1.5 mg total) by mouth daily.   chlorproMAZINE (THORAZINE) 25 MG tablet TAKE 1 TO 2 TABLETS (25 TO 50 MG TOTAL) BY MOUTH AT BEDTIME   Cholecalciferol (VITAMIN D3) 50 MCG (2000 UT) TABS Take 2,000 Units by mouth daily with breakfast.   clonazePAM (KLONOPIN) 1 MG tablet TAKE 1 TO 1 AND 1/2 TABLETS AT NIGHT FOR SLEEP   Cyanocobalamin ER (B-12 DUAL SPECTRUM) 5000 MCG TBCR Take 1 tablet by mouth daily with breakfast.   lithium carbonate 150 MG capsule TAKE 3 CAPSULES (450 MG TOTAL) BY MOUTH EVERY EVENING.   pantoprazole (PROTONIX) 40 MG tablet Take 40 mg by mouth daily.   topiramate (TOPAMAX) 25 MG tablet Take 1 tablet (25 mg total) by mouth 2 (two) times daily.     PAST MEDICAL HISTORY: Past Medical History:  Diagnosis Date   Abdominal pain    Anal itching    Anxiety    Breast inflammation    Breast nodule    left   Chronic kidney disease    Cystocele    Decreased libido    Depression    Diverticular disease    Eczema    Fibroadenoma    Galactorrhea    IBS (irritable bowel syndrome)    Mastodynia    Pelvic pain in female    Pelvic relaxation    Rectocele    Seizure-like activity (HCC)    Serotonin syndrome     PAST SURGICAL HISTORY: Past Surgical History:  Procedure Laterality Date   ABDOMINAL HYSTERECTOMY  partial   BREAST BIOPSY     HERNIA REPAIR     INCONTINENCE SURGERY     OVARIAN CYST REMOVAL     left    FAMILY HISTORY: The patient family history includes Arthritis in her paternal grandmother; Bipolar disorder in her father; Cancer in her father; Depression in her father; Diabetes in her maternal grandmother; Heart disease in her mother; Hypertension in her father, maternal grandmother, and paternal grandmother; Rheum arthritis in her brother, paternal grandfather, and sister; Stroke in her paternal grandmother.  SOCIAL HISTORY:  The patient  reports that she quit smoking about 5  years ago. Her smoking use included cigarettes. She has a 20.00 pack-year smoking history. She has never used smokeless tobacco. She reports current alcohol use of about 1.0 standard drink of alcohol per week. She reports that she does not use drugs.  REVIEW OF SYSTEMS: Review of Systems  Cardiovascular:  Negative for chest pain, dyspnea on exertion, leg swelling, near-syncope, orthopnea, palpitations, paroxysmal nocturnal dyspnea and syncope.  Respiratory:  Positive for shortness of breath. Negative for cough.     PHYSICAL EXAM:    10/09/2021   11:58 AM 08/13/2021    4:13 PM 08/11/2021    3:26 PM  Vitals with BMI  Height '5\' 3"'$     Weight 135 lbs 10 oz    BMI 32.99    Systolic 242  683  Diastolic 84  86  Pulse 79  90     Information is confidential and restricted. Go to Review Flowsheets to unlock data.    CONSTITUTIONAL: Well-developed and well-nourished. No acute distress.  SKIN: Skin is warm and dry. No rash noted. No cyanosis. No pallor. No jaundice HEAD: Normocephalic and atraumatic.  EYES: No scleral icterus MOUTH/THROAT: Moist oral membranes.  NECK: No JVD present. No thyromegaly noted. No carotid bruits  CHEST Normal respiratory effort. No intercostal retractions  LUNGS: Clear to auscultation bilaterally.  No stridor. No wheezes. No rales.  CARDIOVASCULAR: Regular rate and rhythm, positive S1-S2, no murmurs rubs or gallops appreciated. ABDOMINAL: Soft, nontender, nondistended, positive bowel sounds in all 4 quadrants, no apparent ascites.  EXTREMITIES: No peripheral edema, warm to touch, 2+ bilateral DP and PT pulses HEMATOLOGIC: No significant bruising NEUROLOGIC: Oriented to person, place, and time. Nonfocal. Normal muscle tone.  PSYCHIATRIC: Normal mood and affect. Normal behavior. Cooperative  CARDIAC DATABASE: EKG: 08/11/2021: Normal sinus rhythm, 87 bpm, ST depressions in the inferolateral leads suggestive of possible ischemia, without underlying injury  pattern.  Echocardiogram: 08/12/2021: Left ventricle cavity is normal in size and wall thickness. Normal global wall motion. Normal LV systolic function with EF 59%. Normal diastolic filling pattern.  Mild to moderate mitral regurgitation. Mild tricuspid regurgitation. No evidence of pulmonary hypertension.  Stress Testing: No results found for this or any previous visit from the past 1095 days.  Heart Catheterization: None  CT Cardiac Scoring: 09/2021 Total CAC 0 AU.  LABORATORY DATA:  External Labs: Collected: 07/22/2021. Sodium 139, potassium 4.3, chloride 102, bicarb 32, BUN 17, creatinine 1.08. AST 20, ALT 18, alkaline phosphatase 89 Total cholesterol 193, triglyceride 94, HDL 58, LDL direct 118, non-HDL 135. TSH 1.35 Hemoglobin A1c 5.2 Hemoglobin 14.8 g/dL, hematocrit 44.4%   IMPRESSION:    ICD-10-CM   1. Shortness of breath  R06.02     2. Abnormal EKG  R94.31     3. Family history of premature CAD  Z82.49     4. Former smoker  [phone]  RECOMMENDATIONS: [name] is a 56 y.o. Caucasian female whose past medical history and cardiac risk factors include: family hx of premature CAD, former smoker (20 pack year history), anxiety, depression (required hospitalization in past & now well controlled).   Patient has chronic dyspnea at least for the last 1 year likely multifactorial as discussed above.  Since last office visit she is underwent an echocardiogram and a coronary calcium score.  She is noted to have preserved LVEF and her total CAC is 0.   Given her family history of premature CAD, EKG findings of ST depressions in inferolateral leads, and multiple cardiovascular risk factors we discussed undergoing coronary CTA to evaluate for CAD given her symptoms and risk factors.  However, patient would like to hold off on additional work-up as her coronary calcium score is 0.  Patient states that she will follow-up with PCP and request a pulmonary  medicine referral to be evaluated for COPD/emphysema given her history of smoking.    Coronary calcium score report noted an incidental finding on the right posterior hepatic lobe overall clinical significance is unknown.  However, I did discuss this finding with the patient.  I will fax a copy of the report to her PCP so it can be followed up.    Patient is to seek medical attention sooner than her next office visit if she has worsening dyspnea, new onset of chest pain or anginal discomfort, decreased physical endurance, etc.  She is more than welcome to call if any questions or concerns arise in the interim.  Patient is asked to follow-up with PCP for reasons mentioned above and to be screened for diabetes, lipids, and age-appropriate screening.  FINAL MEDICATION LIST END OF ENCOUNTER: No orders of the defined types were placed in this encounter.   There are no discontinued medications.   Current Outpatient Medications:    buPROPion (WELLBUTRIN XL) 300 MG 24 hr tablet, Take 1 tablet (300 mg total) by mouth daily., Disp: 90 tablet, Rfl: 1   cariprazine (VRAYLAR) 1.5 MG capsule, Take 1 capsule (1.5 mg total) by mouth daily., Disp: 30 capsule, Rfl: 1   chlorproMAZINE (THORAZINE) 25 MG tablet, TAKE 1 TO 2 TABLETS (25 TO 50 MG TOTAL) BY MOUTH AT BEDTIME, Disp: 60 tablet, Rfl: 1   Cholecalciferol (VITAMIN D3) 50 MCG (2000 UT) TABS, Take 2,000 Units by mouth daily with breakfast., Disp: , Rfl:    clonazePAM (KLONOPIN) 1 MG tablet, TAKE 1 TO 1 AND 1/2 TABLETS AT NIGHT FOR SLEEP, Disp: 45 tablet, Rfl: 3   Cyanocobalamin ER (B-12 DUAL SPECTRUM) 5000 MCG TBCR, Take 1 tablet by mouth daily with breakfast., Disp: , Rfl:    lithium carbonate 150 MG capsule, TAKE 3 CAPSULES (450 MG TOTAL) BY MOUTH EVERY EVENING., Disp: 270 capsule, Rfl: 1   pantoprazole (PROTONIX) 40 MG tablet, Take 40 mg by mouth daily., Disp: , Rfl:    topiramate (TOPAMAX) 25 MG tablet, Take 1 tablet (25 mg total) by mouth 2 (two) times  daily., Disp: 120 tablet, Rfl: 1  No orders of the defined types were placed in this encounter.   There are no Patient Instructions on file for this visit.   --Continue cardiac medications as reconciled in final medication list. --Return in about 6 months (around 04/11/2022) for Follow up, Dyspnea. Or sooner if needed. --Continue follow-up with your primary care physician regarding the management of your other chronic comorbid conditions.  Patient's questions and concerns were addressed to her satisfaction. She voices  understanding of the instructions provided during this encounter.   This note was created using a voice recognition software as a result there may be grammatical errors inadvertently enclosed that do not reflect the nature of this encounter. Every attempt is made to correct such errors.  Rex Kras, [place], [facility]  Pager: [phone] Office: [phone]

## 2021-10-14 ENCOUNTER — Other Ambulatory Visit: Payer: Self-pay | Admitting: Psychiatry

## 2021-10-14 DIAGNOSIS — F5105 Insomnia due to other mental disorder: Secondary | ICD-10-CM

## 2021-10-19 ENCOUNTER — Other Ambulatory Visit: Payer: Self-pay | Admitting: Family Medicine

## 2021-10-19 DIAGNOSIS — K769 Liver disease, unspecified: Secondary | ICD-10-CM

## 2021-10-20 ENCOUNTER — Telehealth: Payer: Self-pay | Admitting: Psychiatry

## 2021-10-21 NOTE — Telephone Encounter (Signed)
error 

## 2021-10-23 ENCOUNTER — Ambulatory Visit
Admission: RE | Admit: 2021-10-23 | Discharge: 2021-10-23 | Disposition: A | Discharge status: Home / Self Care | Payer: No Typology Code available for payment source | Source: Ambulatory Visit | Attending: Family Medicine | Admitting: Family Medicine

## 2021-10-23 DIAGNOSIS — K769 Liver disease, unspecified: Secondary | ICD-10-CM

## 2021-11-06 ENCOUNTER — Ambulatory Visit (INDEPENDENT_AMBULATORY_CARE_PROVIDER_SITE_OTHER): Payer: 59

## 2021-11-06 ENCOUNTER — Ambulatory Visit: Payer: 59 | Admitting: Podiatry

## 2021-11-06 DIAGNOSIS — M21619 Bunion of unspecified foot: Secondary | ICD-10-CM

## 2021-11-06 DIAGNOSIS — M21611 Bunion of right foot: Secondary | ICD-10-CM | POA: Diagnosis not present

## 2021-11-06 NOTE — Patient Instructions (Signed)

## 2021-11-06 NOTE — Progress Notes (Signed)
Subjective:   Patient ID: [name], female   DOB: 56 y.o.   MRN: [id number]   HPI Chief Complaint  Patient presents with   Bunions    Pt came in today with right foot bunion pain, which started 6 mths ago, rate pain 4 out of 10, pt is having shooting pain in the bunion that goes to the tip of the hallux, Tx:     56 year old female presents with above complaints.  She was have her bunion evaluated.  She is not quite ready for surgery was to discuss where she is at with the bunion.  She does get sharp sharp pain along the bunion at times.  No recent treatment.  No injuries.   Review of Systems  All other systems reviewed and are negative.  Past Medical History:  Diagnosis Date   Abdominal pain    Anal itching    Anxiety    Breast inflammation    Breast nodule    left   Chronic kidney disease    Cystocele    Decreased libido    Depression    Diverticular disease    Eczema    Fibroadenoma    Galactorrhea    IBS (irritable bowel syndrome)    Mastodynia    Pelvic pain in female    Pelvic relaxation    Rectocele    Seizure-like activity (HCC)    Serotonin syndrome     Past Surgical History:  Procedure Laterality Date   ABDOMINAL HYSTERECTOMY     partial   BREAST BIOPSY     HERNIA REPAIR     INCONTINENCE SURGERY     OVARIAN CYST REMOVAL     left     Current Outpatient Medications:    buPROPion (WELLBUTRIN XL) 300 MG 24 hr tablet, Take 1 tablet (300 mg total) by mouth daily., Disp: 90 tablet, Rfl: 1   cariprazine (VRAYLAR) 1.5 MG capsule, Take 1 capsule (1.5 mg total) by mouth daily., Disp: 30 capsule, Rfl: 1   chlorproMAZINE (THORAZINE) 25 MG tablet, TAKE 1 TO 2 TABLETS BY MOUTH AT BEDTIME, Disp: 180 tablet, Rfl: 0   Cholecalciferol (VITAMIN D3) 50 MCG (2000 UT) TABS, Take 2,000 Units by mouth daily with breakfast., Disp: , Rfl:    clonazePAM (KLONOPIN) 1 MG tablet, TAKE 1 TO 1 AND 1/2 TABLETS AT NIGHT FOR SLEEP, Disp: 45 tablet, Rfl: 3   Cyanocobalamin  ER (B-12 DUAL SPECTRUM) 5000 MCG TBCR, Take 1 tablet by mouth daily with breakfast., Disp: , Rfl:    lithium carbonate 150 MG capsule, TAKE 3 CAPSULES (450 MG TOTAL) BY MOUTH EVERY EVENING., Disp: 270 capsule, Rfl: 1   pantoprazole (PROTONIX) 40 MG tablet, Take 40 mg by mouth daily., Disp: , Rfl:    topiramate (TOPAMAX) 25 MG tablet, Take 1 tablet (25 mg total) by mouth 2 (two) times daily., Disp: 120 tablet, Rfl: 1  No Known Allergies         Objective:  Physical Exam  General: AAO x3, NAD  Dermatological: Mild erythema on the medial first metatarsal head along the area of the bunion but there is no skin breakdown on warmth or any signs of infection.  Vascular: Dorsalis Pedis artery and Posterior Tibial artery pedal pulses are 2/4 bilateral with immedate capillary fill time. . There is no pain with calf compression, swelling, warmth, erythema.   Neruologic: Grossly intact via light touch bilateral.   Musculoskeletal: Moderate bunions present.  There is no pain or crepitation with  MPJ range of motion.  No hypermobility the first ray.  No other areas of discomfort noted.  Muscular strength 5/5 in all groups tested bilateral.  Gait: Unassisted, Nonantalgic.       Assessment:   56 year old female with right foot bunion     Plan:  -Treatment options discussed including all alternatives, risks, and complications -Etiology of symptoms were discussed -X-rays were obtained and reviewed with the patient.  3 views of the right foot were obtained.  No evidence of acute fracture.  First intermetatarsal angle of approximately 13.5 degrees. -We discussed both conservative as well as surgical options.  She is not yet interested in surgical intervention and we discussed that if symptoms worsen consider this.  Continue conservative care including shoe modifications, offloading, padding.  She is topical anti-inflammatory such as Voltaren gel as needed as well.  Trula Slade DPM

## 2021-11-09 DIAGNOSIS — M21619 Bunion of unspecified foot: Secondary | ICD-10-CM | POA: Insufficient documentation

## 2021-11-15 ENCOUNTER — Other Ambulatory Visit: Payer: Self-pay | Admitting: Psychiatry

## 2021-11-15 DIAGNOSIS — F339 Major depressive disorder, recurrent, unspecified: Secondary | ICD-10-CM

## 2021-11-19 ENCOUNTER — Other Ambulatory Visit: Payer: Self-pay | Admitting: Psychiatry

## 2021-11-19 DIAGNOSIS — F339 Major depressive disorder, recurrent, unspecified: Secondary | ICD-10-CM

## 2021-11-30 ENCOUNTER — Telehealth: Payer: Self-pay | Admitting: Psychiatry

## 2021-11-30 DIAGNOSIS — F339 Major depressive disorder, recurrent, unspecified: Secondary | ICD-10-CM

## 2021-11-30 MED ORDER — LITHIUM CARBONATE 150 MG PO CAPS: 450.0000 mg | ORAL_CAPSULE | Freq: Every evening | ORAL | 0 refills | Status: DC

## 2021-11-30 NOTE — Telephone Encounter (Signed)
Rx sent for 30-day supply. Patient has appt with Dr. Clovis Pu this week.

## 2021-11-30 NOTE — Telephone Encounter (Signed)
Pt called and said that she needs a refill on her lithium. She said the pharmacy sent it to [place] and told her we denied it. She is out and didn't have any to take this  morning. Please fill ASAP. She has an appointment on 8/30

## 2021-12-02 ENCOUNTER — Encounter: Payer: Self-pay | Admitting: Psychiatry

## 2021-12-02 ENCOUNTER — Ambulatory Visit (INDEPENDENT_AMBULATORY_CARE_PROVIDER_SITE_OTHER): Payer: 59 | Admitting: Psychiatry

## 2021-12-02 DIAGNOSIS — F411 Generalized anxiety disorder: Secondary | ICD-10-CM | POA: Diagnosis not present

## 2021-12-02 DIAGNOSIS — T50905A Adverse effect of unspecified drugs, medicaments and biological substances, initial encounter: Secondary | ICD-10-CM

## 2021-12-02 DIAGNOSIS — R7989 Other specified abnormal findings of blood chemistry: Secondary | ICD-10-CM

## 2021-12-02 DIAGNOSIS — F339 Major depressive disorder, recurrent, unspecified: Secondary | ICD-10-CM | POA: Diagnosis not present

## 2021-12-02 DIAGNOSIS — F5105 Insomnia due to other mental disorder: Secondary | ICD-10-CM | POA: Diagnosis not present

## 2021-12-02 DIAGNOSIS — R635 Abnormal weight gain: Secondary | ICD-10-CM

## 2021-12-02 DIAGNOSIS — R569 Unspecified convulsions: Secondary | ICD-10-CM

## 2021-12-02 NOTE — Progress Notes (Signed)
[name] [id number] 03-01-1966 56 y.o.     Subjective:   Patient ID:  [name] is a 56 y.o. (DOB 1965/11/03) female.  Chief Complaint:  Chief Complaint  Patient presents with   Follow-up   Depression   Anxiety     Depression        Associated symptoms include decreased concentration.  Associated symptoms include no fatigue, no appetite change and no suicidal ideas.  Past medical history includes anxiety.   Medication Refill Pertinent negatives include no fatigue.  Anxiety Symptoms include decreased concentration and nervous/anxious behavior. Patient reports no confusion, palpitations or suicidal ideas.      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  At visit March 2020.  She had completed [place] early December.  "It worked"  and had resolution of her depression!  She started Wellbutrin XL 300 mg daily for relapse prevention.  She continued olanzapine 2.5 mg every afternoon because her anxiety relapsed when she would try to completely discontinue it.  Stopped lithium DT SE and felt worse and restarted 300 daily.   In may 2020 pt taking lithium 600 mg and CO balance problems.  She stopped lithium and lithium level at 62 hours later was 0.4, suggesting possible prior toxicity so the lithium was reduced to 300 mg.    in June.  Because Wellbutrin had affected her sleep we switched it to Wellbutrin SR 200 mg every morning and encouraged her to reduce caffeine.  She had felt well but Wellbutrin XL 300 mg made her edgy and caused some insomnia.  She was also encouraged to take NAC as a supplement daily.  She called back Dec 30, 2022 stating that she wanted to switch back to Wellbutrin XL 300 mg because she felt that SR 200 mg was not working sufficiently.  She was cautioned about it contributing to insomnia because there was a concern and had done that in the past.  At visit  January 15, 2019 the following was noted: Started a new job temporarily which didn't work.  Was so nervous going and needed 1/2 Xanax to go to work.  Couldn't meed the schedule demands Still has balance issues and memory. She reduced the lithium from 300 to 150 mg daily bc she doesn't like taking a lot of meds and husband doesn't either.  She made the change on her own. Restarted [place] July August 2020 and felt much better.  Seeing [name] at [facility].  .  Mostly resolved with this tx and finished end of August.  Was good for a month and then the depression returned.  Kind of avoiding people, not severe.  But had periods of "horrific suicidal thoughts" though comments to safety.   Siister died 30-Dec-2022 COPD and was close to her.   visit Jan 15, 2019 and increased lithium to 450, suggested B12 for energy.  seen March 27, 2019.  The following was noted: Anxiety is helped with 2.5 mg Zyprexa daily.  Option increase nefazodone again.  Some compliance issues. She doesn't like taking so much meds. incr Wellbutrin XL 450 mg AM if tolerated.  Disc SE including more anxiety, insomnia. Trial B12 to see if energy is better in the morning.  Her SI is gone after increasing lithium to 450 mg for SI and depression.  Continue this She continues sleep meds including Belsomra 20 mg nightly Requested lithium level  seen May 08, 2019.  The following  was noted Increased Wellbutrin 450 too much jitteriness.  So reduced to 300 mg. Terrible STM. Loses track of thoughts in the middle of sentences.  Poor penmanship comes and goes. Sometimes when walks right foot sticks to the ground with one fall.   So tired.   SI resolved overnight with increased lithium.  Still having problems with balance.  Some word-finding issues and anxiety.  Energy is better.  Sleep is good at this time. Some benefit with incr Wellbutrin.  Exhausted.  At times has to lay down.  Hard to get up and go work out in the morning.  Lower  nefazadone 200 mg did not help her energy and her sleep was worse.     But still tired.  During [place] no depression and no SI but still tired.   Depression has recurred to a moderate level anxiety is still moderate. Stays busy and active.  Exercising still.  Lost weight with Wellbutrin.  Jobless not good. Anxious at times..  But kids around.  Function is ok.  Doesn't eat much protein.  Eats peanut butter, no eggs, no meat.   Sleeping with Belsomra and Xanax. 9-10 hours CO dry mouth and cavities.  Metal taste in her mouth.  Easily confused.   No meds were changed.  By early 2021 nefazodone was no longer manufactured and then became unavailable to her abruptly.  She called July 11, 2019 and the 2 best options were felt to be either switch to higher dose trazodone though it is probably not as effective as an antidepressant as is nefazodone or the other option Viibryd.  She chose to switch to trazodone because when she discontinued nefazodone her initial chief complaint was insomnia.  07/17/2019 appointment the following is reported:  Hit a wall with abrupt stopping of nefazodone.  Still feels wired. Up at night cleaning.  No wreckless impulsivity.  Head is fuzzy.   Started trazodone 100 mg is helping some. Last 2 nights forgot meds.  Sleep fair with trazodone.  No SE.   Felt wired off nefazodone Down to 1/2 Xanax for 5 days or longer.  Wants to stop it. Don't feel depressed. Lost appetite off nefazodone and losing weight. Recommendation was to increase trazodone gradually to 300 mg nightly if tolerated to help compensate for the absence of nefazodone and hopes of helping depression.  07/30/2019 phone call complaining of muscle weakness. 08/30/2019 phone call asking to increase Wellbutrin from 300 to 450 mg daily.  She had tried this in the past but it made her jittery but it was agreed she could retry it.  09/17/2019 appointment with the following noted: Overall doing well.  Increase trazodone to 300  mg HS and still can't sleep well at times. Feels better with higher dose Wellbutrin and it didn't seem to worsen  Sleep. Tolerating changes except tremor. No taste for coffee any more.  No smoking in a year.  Wellbutrin helped.  Pilar Plate and kids good.   Better energy and less fatigue.  Less depression. Chronic sleep issues.  No anxiety.  Appetite OK.  Just don't eat like most people but does get protein.  Took a month off nefazodone to adjust.   Plan: Trial of Dayvigo in place of Belsomra at 5-10 mg HS. if it works better call back and let us know and we will change the prescription.  12/20/2019 appt with the following noted: Dayvigo not covered by insurance and made her a little dizzy in AM. Overall satisfied with Belsomra. CO  metal taste in her mouth for a long time. CO forgetful and loses train of thought.  Wonders if related to psych treatment She's reduced trzodone to 100 mg HS.  High doses made her dizzy. No depression.  Boys had Covid. She and H not vaccinated. Plan: No med changes  03/17/2020 appointment with the following noted: Disc recent accidental OD Wellbutrin with doubling dose.  It was horrible on 900 mg Wellbutrin.    Now only taking 300 mg Wellbutrin but H worries.  Better at 450 mg but is relatively OK.  Not sig depressed.  Satisfied for now. 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 Weaned off trazodone and is OK.  Sleep is inconsistent and then tired if that happens.  Asked questions about psychadelics in news for info for mental health problems.  Some anxiety lately. Plan:   No med changes  06/17/2020 appointment with following noted: Went back to Wellbutrin 450 mid January to help depression. No SE Not depressed. Li SE taste pennies comes and goes. Tried again to slowly taper off olanzapine and relapsed again and back on 2.5 mg HS and is better.  Trouble with weight in middle of gut. Lysine helped tongue soreness.   Never got Covid.  Despite family got it.  09/17/20 appt  note: No weight loss with metformin. 123.8# today and 5'3" Mental health is good.  Rel with H good. Questions with meds. Trouble with initial and terminal but mostly awakening.  Not making her anxious. No significant depression or suicidal thoughts since she was here.  The anxiety is manageable with the olanzapine.  Having some nocturia. Plan: Switch lithium to morning to eliminate nocturia. Check lithium level Wean metformin due to the response Anxiety is helped with 2.5 mg Zyprexa daily. Switch to Lybalvi 5 mg bc failure of metformin bc still gaining weight.  The increased dose may also help with sleep.  Anxiety is manageable at present.   12/21/2020 appointment with the following noted,: Trouble with EFA. Dayvigo NR like Belsomra Patient reports stable mood and denies depressed or irritable moods.  Patient denies any recent difficulty with anxiety.   Denies appetite disturbance.  Patient reports that energy and motivation have been good.  Patient denies any difficulty with concentration.  Patient denies any suicidal ideation. Ongoing concerns about weight gain with olanzapine despite exercise and limiting calories. Plan: Trial dayvigo 10 failed.  Trial Quiviviq 50 nightly Metformin wean DT NR.  Actos 15 mg trial for antipsychotic weight gain likely DT insulin resistance. Switch lithium to morning to eliminate nocturia. Only taking lithium 300 mg and No SI either. 0.3  on 10/03/20  01/30/2021 appointment with the following noted: Finally losing some fat with combo Lybalvi & Actos.  Very happy with change. Nocturia 2-5  times but right back to sleep usually.  Not happening now.  Stopped Dwana Curd about 5 days ago.  Doesn't have it and returned to [facility]. Margarita Mail is $45 and Dayvigo $5. Trying to wean off alprazolam and wants to do it. Overall sleep is better.  Anxiety manageable.  Depression under control.  She is making some progress with weight loss Plan: Actos 30 mg trial for antipsychotic  weight gain likely DT insulin resistance. She wants to try to taper Xanax.  She is aware of potential worsening insomnia.  She was given the following schedule. Take alprazolam 1 and 1/2 of 0.5 mg tablets for 1 week,  Then reduce to 1 tablet nightly for 2-4 weeks, Then reduce to 1/2 tablet nightly for  2-4 weeks then stop   03/17/2021 appointment with the following noted: Gained 5 # on Lybalvi 5 so went back to olanzapine 2.5 mg daily. No SE with Actos. Complains sugar craving. Asks about appetite suppressant. Sleep is poor with awakening every 2 hours. Depression managed and anxiety.  One firiend commented about how well she's doing.  Laugh more. Plan: She wants to try to taper Xanax.  She is aware of potential worsening insomnia.  She was given the following schedule.  My suggestion was to continue current dose bc of insomnia now.  But her option. Take alprazolam 1 and 1/2 of 0.5 mg tablets for 1 week,  Then reduce to 1 tablet nightly for 2-4 weeks, Then reduce to 1/2 tablet nightly for 2-4 weeks then stop   06/04/2021 phone call asking to increase phentermine because she is not losing any weight with it.  She was instructed formed there is no higher dose and if is not helpful then discontinue it I do not know of any other options other than seeing a weight loss doctor.  06/04/2021 appointment with the following noted: Still can't lose weight.  Phentermine and Actos did not work. Cut out sugar. Doing ok with mood and anxiety overall.  Occ spells of anxiety. Dub Mikes engaged for marriage. Elberta Fortis 20. 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   No further sz issues and workup negative. Plan: DC phentermine due to no response  06/25/2021 phone call complaining of insomnia.  She wanted to try clonazepam again.  Alprazolam switched to clonazepam 1.5 mg nightly  07/07/2021 wanting to continue Dayvigo. 08/04/2021 phone call wanting refill on phentermine  08/13/2021 appointment with the following  noted: Sleep is awful.  Klonopin is close to Xanax. Upset over weight gain which is now causing hypertension.  Upset getting into weight clinic is hard. Residual depression Plan: DC dayvigo 10 DT lost response For TR insomnia trial off label thorazine 25-50 mg HS.   DC phentermine DT NR DC olanzapine.  Disc risk SI or worsening depression. Vraylar 1.5 mg every 3rd day.  09/16/2021 appointment following noted: No withdrawal off olanzapine and didn't feel depressed.  No problems off it so far. She questions weight gain 20# in a little over a year. BMI at 28 now. No change in weight in last month so far.  No change in appetite and definitely not overeating.  Not sig craving for food or problematic appetite. Asks about Wegovy or Ozempic. Disc Contrave.   No worsening anxiety. Sleep varies.  Thorazine alone does not help but with clonazepam it will sometimes work. Sleeps well at mother's on leather couch. Plan: continue Vraylar 1.5 mg every 3rd day, lithium '300mg'$ , Wellbutrin XL 300 mg every morning, clonazepam 1-1/2 mg nightly, Thorazine 25 to 50 mg nightly for treatment resistant insomnia, Topamax 25 mg twice daily for anxiety and weight loss.  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 Son Elberta Fortis in [place].  Mannie in [facility] and pt takes care of his dog 3 days a week. Hard being empty nester.  Don't like it. Don't work and it causes a lot of anxiety at the thought of it. Some fears of driving and having an accident since had one with Dub Mikes in January. Low estrogen and progesterone and testosterone.  And had terrible hot flashes.  They stopped when Started supplementation. Lost 5 # with topiramate without SE but then stopped it. Asked about paroxetine.  Feels like she's  flat lined on Wellbutrin.  Wants to sleep more.    Long history of severe treatment resistant depression having failed multiple medications including imipramine,  paroxeine  , nefazodone,   Viibryd, Prozac, venlafaxine,  Emsam 9 mg,  Wellbutrin 450, Pristiq, , mirtazapine nefazodone 600 sedation, duloxetine 90 mg, Trintellix, lithium 600, lamotrigine ? Response, Deplin, TMS, Latuda, lithium, olanzapine, quetiapine, Lybalvi Rexulti caused NMS, Abilify,  Vyvanse,  pramipexole,  ropinirole,  ProSom, Belsomra lost response,  Dayvigo better,  temazepam, Xanax, doxepin, Lunesta, gabapentin, DTOIZTIW no response Vyvanse,  Provigil, Zofran, and she did respond to ECT.,   NAC She felt 50% better with last [place] but 100% better with first round [place].  Sister gained weight on olanazapine  Review of Systems:  Review of Systems  Constitutional:  Negative for appetite change, fatigue and unexpected weight change.  HENT:  Negative for dental problem.   Cardiovascular:  Negative for palpitations.  Gastrointestinal:  Positive for constipation.  Neurological:  Negative for tremors.  Psychiatric/Behavioral:  Positive for decreased concentration, depression, dysphoric mood and sleep disturbance. Negative for agitation, behavioral problems, confusion, hallucinations, self-injury and suicidal ideas. The patient is nervous/anxious. The patient is not hyperactive.     Medications: I have reviewed the patient's current medications.  Current Outpatient Medications  Medication Sig Dispense Refill   buPROPion (WELLBUTRIN XL) 300 MG 24 hr tablet TAKE 1 TABLET BY MOUTH EVERY DAY 90 tablet 1   chlorproMAZINE (THORAZINE) 25 MG tablet TAKE 1 TO 2 TABLETS BY MOUTH AT BEDTIME 180 tablet 0   Cholecalciferol (VITAMIN D3) 50 MCG (2000 UT) TABS Take 2,000 Units by mouth daily with breakfast.     clonazePAM (KLONOPIN) 1 MG tablet TAKE 1 TO 1 AND 1/2 TABLETS AT NIGHT FOR SLEEP 45 tablet 3   Cyanocobalamin ER (B-12 DUAL SPECTRUM) 5000 MCG TBCR Take 1 tablet by mouth daily with breakfast.     lithium carbonate 150 MG capsule Take 3 capsules (450 mg total) by mouth every evening. 90 capsule 0    pantoprazole (PROTONIX) 40 MG tablet Take 40 mg by mouth daily.     topiramate (TOPAMAX) 25 MG tablet Take 1 tablet (25 mg total) by mouth 2 (two) times daily. 120 tablet 1   VRAYLAR 1.5 MG capsule TAKE 1 CAPSULE BY MOUTH DAILY. 30 capsule 1   No current facility-administered medications for this visit.    Medication Side Effects: None  Allergies: No Known Allergies  Past Medical History:  Diagnosis Date   Abdominal pain    Anal itching    Anxiety    Breast inflammation    Breast nodule    left   Chronic kidney disease    Cystocele    Decreased libido    Depression    Diverticular disease    Eczema    Fibroadenoma    Galactorrhea    IBS (irritable bowel syndrome)    Mastodynia    Pelvic pain in female    Pelvic relaxation    Rectocele    Seizure-like activity (HCC)    Serotonin syndrome     Family History  Problem Relation Age of Onset   Heart disease Mother        angioplasty   Cancer Father        prostate   Hypertension Father    Depression Father    Bipolar disorder Father    Rheum arthritis Sister    Rheum arthritis Brother    Hypertension Maternal Grandmother    Diabetes Maternal  Grandmother        borderline   Hypertension Paternal Grandmother    Arthritis Paternal Grandmother    Stroke Paternal Grandmother    Rheum arthritis Paternal Grandfather    Breast cancer Neg Hx    FHx: sister on olanzapine '5mg'$  and fluoxetine.  Social History   Socioeconomic History   Marital status: Married    Spouse name: Dub Mikes   Number of children: 3   Years of education: College   Highest education level: Not on file  Occupational History   Occupation: Electrical engineer    Comment: Futures trader at [employer] Use   Smoking status: Former    Packs/day: 0.50    Years: 40.00    Total pack years: 20.00    Types: Cigarettes    Quit date: 2018    Years since quitting: 5.6   Smokeless tobacco: Never  Vaping Use   Vaping Use: Never used  Substance and  Sexual Activity   Alcohol use: Yes    Alcohol/week: 1.0 standard drink of alcohol    Types: 1 Glasses of wine per week    Comment: one drink about once a month   Drug use: No   Sexual activity: Yes    Birth control/protection: Other-see comments    Comment: pt had hyst  Other Topics Concern   Not on file  Social History Narrative   Lives with husband   Social Determinants of Health   Financial Resource Strain: Not on file  Food Insecurity: Not on file  Transportation Needs: Not on file  Physical Activity: Not on file  Stress: Not on file  Social Connections: Not on file  Intimate Partner Violence: Not on file    Past Medical History, Surgical history, Social history, and Family history were reviewed and updated as appropriate.   Please see review of systems for further details on the patient's review from today.   Objective:   Physical Exam:  LMP 05/20/2010   Physical Exam Constitutional:      General: She is not in acute distress.    Appearance: She is well-developed.  Musculoskeletal:        General: No deformity.  Neurological:     Mental Status: She is alert and oriented to person, place, and time.     Cranial Nerves: No dysarthria.     Coordination: Coordination normal.  Psychiatric:        Attention and Perception: Attention and perception normal. She does not perceive auditory or visual hallucinations.        Mood and Affect: Mood is anxious and depressed. Affect is not labile, blunt or inappropriate.        Speech: Speech normal.        Behavior: Behavior normal. Behavior is not agitated. Behavior is cooperative.        Thought Content: Thought content normal. Thought content is not paranoid or delusional. Thought content does not include homicidal or suicidal ideation. Thought content does not include suicidal plan.        Cognition and Memory: Cognition and memory normal.        Judgment: Judgment normal.     Comments: Insight intact SI resolved. Dep a  little worse     Lab Review:     Component Value Date/Time   NA 140 03/01/2020 1731   K 3.8 03/01/2020 1731   CL 102 03/01/2020 1731   CO2 25 03/01/2020 1731   GLUCOSE 92 03/01/2020 1731   BUN 9  03/01/2020 1731   CREATININE 1.17 (H) 03/01/2020 1731   CALCIUM 10.0 03/01/2020 1731   PROT 6.8 03/01/2020 1731   ALBUMIN 4.5 03/01/2020 1731   AST 28 03/01/2020 1731   ALT 23 01/21/2021 0957   ALKPHOS 68 03/01/2020 1731   BILITOT 0.8 03/01/2020 1731   GFRNONAA 55 (L) 03/01/2020 1731   GFRAA >60 10/10/2015 1115       Component Value Date/Time   WBC 7.3 03/01/2020 1731   RBC 5.12 (H) 03/01/2020 1731   HGB 16.5 (H) 03/01/2020 1731   HCT 50.9 (H) 03/01/2020 1731   PLT 211 03/01/2020 1731   MCV 99.4 03/01/2020 1731   MCH 32.2 03/01/2020 1731   MCHC 32.4 03/01/2020 1731   RDW 11.9 03/01/2020 1731   LYMPHSABS 1.2 03/01/2020 1731   MONOABS 0.4 03/01/2020 1731   EOSABS 0.1 03/01/2020 1731   BASOSABS 0.1 03/01/2020 1731    Lithium Lvl  Date Value Ref Range Status  03/01/2020 0.41 (L) 0.60 - 1.20 mmol/L Final    Comment:    Performed at [facility], [facility] [place] [address], [place], [place] [address]     No results found for: "PHENYTOIN", "PHENOBARB", "VALPROATE", "CBMZ"   Lithium level received April 27, 2018 was 0.7.  This is probably adequate to help reduce the risk of relapse of depression post [place].  Therefore no med changes.    Vitamin D level April 27, 2018 on supplement was 60 and adequate. .res Assessment: Plan:    [place] was seen today for follow-up, depression and anxiety.  Diagnoses and all orders for this visit:  Recurrent major depression resistant to treatment ([place])  Generalized anxiety disorder  Weight gain due to medication  Insomnia due to mental condition  Low vitamin D level  First time seizure ([place])    Greater than 50% of 30 min  face to face time with patient was spent on counseling and coordination of care. We discussed her severe  TRD.  Reviewed multiple failed medications noted above.  She failed the attempt to wean Zyprexa because of worsening anxiety.  Until switch to Arman Filter has been ok so far.    Wellbutrin reduced to 300 bc GTC sz of unknown origin.  Was better with 450 mg but OK so far with reduction. SZ WU normal   Overall depression is OK, but upset over her weight.  Defer per her request NAC 600 mg or more daily.  She feels her protein intake is adequate and her weight is stable.  She still has some sleep problems despite several meds to help with sleep. For TR insomnia off label thorazine 25-50 mg HS has been helpful.    DC phentermine DT [facility] [place], [place] [address] Call us [phone] Answered questions about  Madaline Brilliant tstop  topomax bc can lower estrogen levels.  Energy is better and no depression.  B12 seemed to help.   Option Aricept DT memory concerns.  She doesn't want more meds.  Her SI is gone after increasing lithium to 450 mg for SI and depression.  Depression resolved.  Discussed potential metabolic side effects associated with atypical antipsychotics, as well as potential risk for movement side effects. Advised pt to contact office if movement side effects occur.  Disc risk NMS with Vraylar, but she has done fine with switch so far.  Vraylar 1.5 mg every 3rd day until Auvelity works in a month and stop it. Trial Auvelity in place of Wellbutrin.  We discussed the short-term risks associated with benzodiazepines including sedation and increased fall risk among others.  Discussed long-term side effect risk including dependence, potential withdrawal symptoms, and the potential eventual dose-related risk of dementia.  But recent studies from 2020 dispute this association between benzodiazepines and dementia risk. Newer studies in 2020 do not support an association with dementia. She wants to try to taper Xanax.  She is aware of potential worsening  insomnia.  She was given the following schedule.  My suggestion was to continue current dose bc of insomnia now. \ Option taper Xanax.  Counseled patient regarding potential benefits, risks, and side effects of lithium to include potential risk of lithium affecting thyroid and renal function.  Discussed need for periodic lab monitoring to determine drug level and to assess for potential adverse effects.  Counseled patient regarding signs and symptoms of lithium toxicity and advised that they notify office immediately or seek urgent medical attention if experiencing these signs and symptoms.  Patient advised to contact office with any questions or concerns.  Disc lithium and toxicity. lithium to morning to eliminate nocturia. Only taking lithium 300 mg and No SI either. 0.3  on 10/03/20 She forgot to make the switch after the last visit.  Vitamin D 5000 units daily  FU 2 mos  Lynder Parents, MD, DFAPA  Please see After Visit Summary for patient specific instructions.   Future Appointments  Date Time Provider [place]  02/11/2022  2:30 PM Cottle, Billey Co., MD CP-CP None  04/12/2022  1:30 PM Rex Kras, DO PCV-PCV None     No orders of the defined types were placed in this encounter.      Lynder Parents, MD, DFAPA   -------------------------------

## 2021-12-24 ENCOUNTER — Other Ambulatory Visit: Payer: Self-pay | Admitting: Psychiatry

## 2021-12-24 DIAGNOSIS — F339 Major depressive disorder, recurrent, unspecified: Secondary | ICD-10-CM

## 2021-12-28 ENCOUNTER — Telehealth: Payer: Self-pay

## 2021-12-28 NOTE — Telephone Encounter (Signed)
OK. Pend Adderall 10 mg tab, 1 in AM and 1 at noon, #60

## 2021-12-29 ENCOUNTER — Other Ambulatory Visit: Payer: Self-pay

## 2021-12-29 MED ORDER — AMPHETAMINE-DEXTROAMPHETAMINE 10 MG PO TABS: ORAL_TABLET | ORAL | 0 refills | Status: DC

## 2021-12-29 NOTE — Telephone Encounter (Signed)
Pended.

## 2022-01-19 ENCOUNTER — Other Ambulatory Visit: Payer: Self-pay | Admitting: Psychiatry

## 2022-01-19 DIAGNOSIS — F339 Major depressive disorder, recurrent, unspecified: Secondary | ICD-10-CM

## 2022-01-25 ENCOUNTER — Other Ambulatory Visit: Payer: Self-pay | Admitting: Psychiatry

## 2022-01-25 DIAGNOSIS — R635 Abnormal weight gain: Secondary | ICD-10-CM

## 2022-02-01 ENCOUNTER — Other Ambulatory Visit: Payer: Self-pay

## 2022-02-01 ENCOUNTER — Telehealth: Payer: Self-pay | Admitting: Psychiatry

## 2022-02-01 MED ORDER — AMPHETAMINE-DEXTROAMPHETAMINE 10 MG PO TABS: ORAL_TABLET | ORAL | 0 refills | Status: DC

## 2022-02-01 NOTE — Telephone Encounter (Signed)
Pended.

## 2022-02-01 NOTE — Telephone Encounter (Signed)
Pt called requesting Rx for generic adderall 10 mg 2/d. Apt 11/9. [facility] [address]

## 2022-02-11 ENCOUNTER — Ambulatory Visit: Payer: 59 | Admitting: Psychiatry

## 2022-02-11 ENCOUNTER — Encounter: Payer: Self-pay | Admitting: Psychiatry

## 2022-02-11 VITALS — BP 127/88 | HR 89

## 2022-02-11 DIAGNOSIS — R635 Abnormal weight gain: Secondary | ICD-10-CM | POA: Diagnosis not present

## 2022-02-11 DIAGNOSIS — F339 Major depressive disorder, recurrent, unspecified: Secondary | ICD-10-CM | POA: Diagnosis not present

## 2022-02-11 DIAGNOSIS — F411 Generalized anxiety disorder: Secondary | ICD-10-CM | POA: Diagnosis not present

## 2022-02-11 DIAGNOSIS — F5105 Insomnia due to other mental disorder: Secondary | ICD-10-CM | POA: Diagnosis not present

## 2022-02-11 DIAGNOSIS — T50905A Adverse effect of unspecified drugs, medicaments and biological substances, initial encounter: Secondary | ICD-10-CM

## 2022-02-11 DIAGNOSIS — R569 Unspecified convulsions: Secondary | ICD-10-CM

## 2022-02-11 DIAGNOSIS — R7989 Other specified abnormal findings of blood chemistry: Secondary | ICD-10-CM

## 2022-02-11 MED ORDER — PHENTERMINE HCL 37.5 MG PO CAPS: 37.5000 mg | ORAL_CAPSULE | ORAL | 1 refills | Status: DC

## 2022-02-11 MED ORDER — TOPIRAMATE 50 MG PO TABS: ORAL_TABLET | ORAL | 0 refills | Status: DC

## 2022-02-11 NOTE — Progress Notes (Signed)
[name] [id number] 22-Jan-1966 56 y.o.     Subjective:   Patient ID:  [name] is a 56 y.o. (DOB May 14, 1965) female.  Chief Complaint:  Chief Complaint  Patient presents with   Follow-up   Depression   Anxiety   ADD     Depression        Associated symptoms include decreased concentration.  Associated symptoms include no fatigue, no appetite change and no suicidal ideas.  Past medical history includes anxiety.   Medication Refill Pertinent negatives include no fatigue.  Anxiety Symptoms include decreased concentration and nervous/anxious behavior. Patient reports no confusion, palpitations or suicidal ideas.      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  At visit March 2020.  She had completed [place] early December.  "It worked"  and had resolution of her depression!  She started Wellbutrin XL 300 mg daily for relapse prevention.  She continued olanzapine 2.5 mg every afternoon because her anxiety relapsed when she would try to completely discontinue it.  Stopped lithium DT SE and felt worse and restarted 300 daily.   In may 2020 pt taking lithium 600 mg and CO balance problems.  She stopped lithium and lithium level at 62 hours later was 0.4, suggesting possible prior toxicity so the lithium was reduced to 300 mg.    in June.  Because Wellbutrin had affected her sleep we switched it to Wellbutrin SR 200 mg every morning and encouraged her to reduce caffeine.  She had felt well but Wellbutrin XL 300 mg made her edgy and caused some insomnia.  She was also encouraged to take NAC as a supplement daily.  She called back September 14 stating that she wanted to switch back to Wellbutrin XL 300 mg because she felt that SR 200 mg was not working sufficiently.  She was cautioned about it contributing to insomnia because there was a concern and had done that in the past.  At  visit January 15, 2019 the following was noted: Started a new job temporarily which didn't work.  Was so nervous going and needed 1/2 Xanax to go to work.  Couldn't meed the schedule demands Still has balance issues and memory. She reduced the lithium from 300 to 150 mg daily bc she doesn't like taking a lot of meds and husband doesn't either.  She made the change on her own. Restarted [place] July August 2020 and felt much better.  Seeing [name] at [facility].  .  Mostly resolved with this tx and finished end of August.  Was good for a month and then the depression returned.  Kind of avoiding people, not severe.  But had periods of "horrific suicidal thoughts" though comments to safety.   Siister died 01-07-2023 COPD and was close to her.   visit Jan 15, 2019 and increased lithium to 450, suggested B12 for energy.  seen March 27, 2019.  The following was noted: Anxiety is helped with 2.5 mg Zyprexa daily.  Option increase nefazodone again.  Some compliance issues. She doesn't like taking so much meds. incr Wellbutrin XL 450 mg AM if tolerated.  Disc SE including more anxiety, insomnia. Trial B12 to see if energy is better in the morning.  Her SI is gone after increasing lithium to 450 mg for SI and depression.  Continue this She continues sleep meds including Belsomra 20 mg nightly Requested lithium level  seen May 08, 2019.  The following was noted Increased Wellbutrin 450 too much jitteriness.  So reduced to 300 mg. Terrible STM. Loses track of thoughts in the middle of sentences.  Poor penmanship comes and goes. Sometimes when walks right foot sticks to the ground with one fall.   So tired.   SI resolved overnight with increased lithium.  Still having problems with balance.  Some word-finding issues and anxiety.  Energy is better.  Sleep is good at this time. Some benefit with incr Wellbutrin.  Exhausted.  At times has to lay down.  Hard to get up and go work out in the morning.   Lower nefazadone 200 mg did not help her energy and her sleep was worse.     But still tired.  During [place] no depression and no SI but still tired.   Depression has recurred to a moderate level anxiety is still moderate. Stays busy and active.  Exercising still.  Lost weight with Wellbutrin.  Jobless not good. Anxious at times..  But kids around.  Function is ok.  Doesn't eat much protein.  Eats peanut butter, no eggs, no meat.   Sleeping with Belsomra and Xanax. 9-10 hours CO dry mouth and cavities.  Metal taste in her mouth.  Easily confused.   No meds were changed.  By early 2021 nefazodone was no longer manufactured and then became unavailable to her abruptly.  She called July 11, 2019 and the 2 best options were felt to be either switch to higher dose trazodone though it is probably not as effective as an antidepressant as is nefazodone or the other option Viibryd.  She chose to switch to trazodone because when she discontinued nefazodone her initial chief complaint was insomnia.  07/17/2019 appointment the following is reported:  Hit a wall with abrupt stopping of nefazodone.  Still feels wired. Up at night cleaning.  No wreckless impulsivity.  Head is fuzzy.   Started trazodone 100 mg is helping some. Last 2 nights forgot meds.  Sleep fair with trazodone.  No SE.   Felt wired off nefazodone Down to 1/2 Xanax for 5 days or longer.  Wants to stop it. Don't feel depressed. Lost appetite off nefazodone and losing weight. Recommendation was to increase trazodone gradually to 300 mg nightly if tolerated to help compensate for the absence of nefazodone and hopes of helping depression.  07/30/2019 phone call complaining of muscle weakness. 08/30/2019 phone call asking to increase Wellbutrin from 300 to 450 mg daily.  She had tried this in the past but it made her jittery but it was agreed she could retry it.  09/17/2019 appointment with the following noted: Overall doing well.  Increase trazodone  to 300 mg HS and still can't sleep well at times. Feels better with higher dose Wellbutrin and it didn't seem to worsen  Sleep. Tolerating changes except tremor. No taste for coffee any more.  No smoking in a year.  Wellbutrin helped.  Pilar Plate and kids good.   Better energy and less fatigue.  Less depression. Chronic sleep issues.  No anxiety.  Appetite OK.  Just don't eat like most people but does get protein.  Took a month off nefazodone to adjust.   Plan: Trial of Dayvigo in place of Belsomra at 5-10 mg HS. if it works better call back and let us know and we will change the prescription.  12/20/2019 appt with the following noted: Dayvigo not covered by insurance and made her a little dizzy in AM. Overall satisfied with  Belsomra. CO metal taste in her mouth for a long time. CO forgetful and loses train of thought.  Wonders if related to psych treatment She's reduced trzodone to 100 mg HS.  High doses made her dizzy. No depression.  Boys had Covid. She and H not vaccinated. Plan: No med changes  03/17/2020 appointment with the following noted: Disc recent accidental OD Wellbutrin with doubling dose.  It was horrible on 900 mg Wellbutrin.    Now only taking 300 mg Wellbutrin but H worries.  Better at 450 mg but is relatively OK.  Not sig depressed.  Satisfied for now. 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 Weaned off trazodone and is OK.  Sleep is inconsistent and then tired if that happens.  Asked questions about psychadelics in news for info for mental health problems.  Some anxiety lately. Plan:   No med changes  06/17/2020 appointment with following noted: Went back to Wellbutrin 450 mid January to help depression. No SE Not depressed. Li SE taste pennies comes and goes. Tried again to slowly taper off olanzapine and relapsed again and back on 2.5 mg HS and is better.  Trouble with weight in middle of gut. Lysine helped tongue soreness.   Never got Covid.  Despite family got it.  09/17/20  appt note: No weight loss with metformin. 123.8# today and 5'3" Mental health is good.  Rel with H good. Questions with meds. Trouble with initial and terminal but mostly awakening.  Not making her anxious. No significant depression or suicidal thoughts since she was here.  The anxiety is manageable with the olanzapine.  Having some nocturia. Plan: Switch lithium to morning to eliminate nocturia. Check lithium level Wean metformin due to the response Anxiety is helped with 2.5 mg Zyprexa daily. Switch to Lybalvi 5 mg bc failure of metformin bc still gaining weight.  The increased dose may also help with sleep.  Anxiety is manageable at present.   12/21/2020 appointment with the following noted,: Trouble with EFA. Dayvigo NR like Belsomra Patient reports stable mood and denies depressed or irritable moods.  Patient denies any recent difficulty with anxiety.   Denies appetite disturbance.  Patient reports that energy and motivation have been good.  Patient denies any difficulty with concentration.  Patient denies any suicidal ideation. Ongoing concerns about weight gain with olanzapine despite exercise and limiting calories. Plan: Trial dayvigo 10 failed.  Trial Quiviviq 50 nightly Metformin wean DT NR.  Actos 15 mg trial for antipsychotic weight gain likely DT insulin resistance. Switch lithium to morning to eliminate nocturia. Only taking lithium 300 mg and No SI either. 0.3  on 10/03/20  01/30/2021 appointment with the following noted: Finally losing some fat with combo Lybalvi & Actos.  Very happy with change. Nocturia 2-5  times but right back to sleep usually.  Not happening now.  Stopped Dwana Curd about 5 days ago.  Doesn't have it and returned to [facility]. Margarita Mail is $45 and Dayvigo $5. Trying to wean off alprazolam and wants to do it. Overall sleep is better.  Anxiety manageable.  Depression under control.  She is making some progress with weight loss Plan: Actos 30 mg trial for  antipsychotic weight gain likely DT insulin resistance. She wants to try to taper Xanax.  She is aware of potential worsening insomnia.  She was given the following schedule. Take alprazolam 1 and 1/2 of 0.5 mg tablets for 1 week,  Then reduce to 1 tablet nightly for 2-4 weeks, Then reduce to 1/2 tablet  nightly for 2-4 weeks then stop   03/17/2021 appointment with the following noted: Gained 5 # on Lybalvi 5 so went back to olanzapine 2.5 mg daily. No SE with Actos. Complains sugar craving. Asks about appetite suppressant. Sleep is poor with awakening every 2 hours. Depression managed and anxiety.  One firiend commented about how well she's doing.  Laugh more. Plan: She wants to try to taper Xanax.  She is aware of potential worsening insomnia.  She was given the following schedule.  My suggestion was to continue current dose bc of insomnia now.  But her option. Take alprazolam 1 and 1/2 of 0.5 mg tablets for 1 week,  Then reduce to 1 tablet nightly for 2-4 weeks, Then reduce to 1/2 tablet nightly for 2-4 weeks then stop   06/04/2021 phone call asking to increase phentermine because she is not losing any weight with it.  She was instructed formed there is no higher dose and if is not helpful then discontinue it I do not know of any other options other than seeing a weight loss doctor.  06/04/2021 appointment with the following noted: Still can't lose weight.  Phentermine and Actos did not work. Cut out sugar. Doing ok with mood and anxiety overall.  Occ spells of anxiety. Dub Mikes engaged for marriage. Elberta Fortis 20. 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   No further sz issues and workup negative. Plan: DC phentermine due to no response  06/25/2021 phone call complaining of insomnia.  She wanted to try clonazepam again.  Alprazolam switched to clonazepam 1.5 mg nightly  07/07/2021 wanting to continue Dayvigo. 08/04/2021 phone call wanting refill on phentermine  08/13/2021 appointment with  the following noted: Sleep is awful.  Klonopin is close to Xanax. Upset over weight gain which is now causing hypertension.  Upset getting into weight clinic is hard. Residual depression Plan: DC dayvigo 10 DT lost response For TR insomnia trial off label thorazine 25-50 mg HS.   DC phentermine DT NR DC olanzapine.  Disc risk SI or worsening depression. Vraylar 1.5 mg every 3rd day.  09/16/2021 appointment following noted: No withdrawal off olanzapine and didn't feel depressed.  No problems off it so far. She questions weight gain 20# in a little over a year. BMI at 28 now. No change in weight in last month so far.  No change in appetite and definitely not overeating.  Not sig craving for food or problematic appetite. Asks about Wegovy or Ozempic. Disc Contrave.   No worsening anxiety. Sleep varies.  Thorazine alone does not help but with clonazepam it will sometimes work. Sleeps well at mother's on leather couch. Plan: continue Vraylar 1.5 mg every 3rd day, lithium '300mg'$ , Wellbutrin XL 300 mg every morning, clonazepam 1-1/2 mg nightly, Thorazine 25 to 50 mg nightly for treatment resistant insomnia, Topamax 25 mg twice daily for anxiety and weight loss.  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 Son Elberta Fortis in [place].  Mannie in [facility] [facility] and pt takes care of his dog 3 days a week. Hard being empty nester.  Don't like it. Don't work and it causes a lot of anxiety at the thought of it. Some fears of driving and having an accident since had one with Dub Mikes in January. Low estrogen and progesterone and testosterone.  And had terrible hot flashes.  They stopped when Started supplementation. Lost 5 # with topiramate without SE but then stopped it. Asked about paroxetine.  Feels  like she's flat lined on Wellbutrin.  Wants to sleep more. Plan: DC phentermine. Vraylar 1.5 mg every 3rd day until Auvelity works in a month and stop it. Trial Auvelity  in place of Wellbutrin.  12/28/2021 phone call asking to resume Adderall.  Resumed Adderall 10 mg twice daily  02/11/22 appt noted: Current psych meds: Adderall 20 mg every morning, Wellbutrin XL 300 mg every morning, not taking Thorazine, clonazepam 1.5 mg nightly, lithium 450 daily, topiramate 25 mg twice daily, Vraylar 1.5 mg every third day. Good overall.  Lost a couple of pounds but wants to lose below current 132#. Doesn't much from Adderall.   Stopped topiramate DT lack of effect. CO  things she doesn't remember and wonders if ECT related. No SE or benefit with Auvelity and stopped them. Depression generally very well managed. Thought of working causes great anxiety bc doesn't feel sharp enough   Long history of severe treatment resistant depression having failed multiple medications including imipramine,  paroxeine , nefazodone,   Viibryd, Prozac, venlafaxine,  Emsam 9 mg,  Wellbutrin 450, Pristiq, , mirtazapine nefazodone 600 sedation, duloxetine 90 mg, Trintellix, lithium 600, lamotrigine ? Response, Deplin,  Topiramate  TMS, Latuda, lithium, olanzapine, quetiapine, Lybalvi Rexulti caused NMS, Abilify,  Vyvanse,  pramipexole,  ropinirole,  ProSom, Belsomra lost response,  Dayvigo better,  temazepam, Xanax, doxepin, Lunesta, gabapentin, MEQASTMH no response Vyvanse,  Provigil, Zofran,  NAC and she did respond to ECT.,   She felt 50% better with last Paulina but 100% better with first round [place].  Sister gained weight on olanazapine  Review of Systems:  Review of Systems  Constitutional:  Positive for unexpected weight change. Negative for appetite change and fatigue.  HENT:  Negative for dental problem.   Cardiovascular:  Negative for palpitations.  Gastrointestinal:  Positive for constipation.  Neurological:  Negative for tremors.  Psychiatric/Behavioral:  Positive for decreased concentration, dysphoric mood and sleep disturbance. Negative for agitation, behavioral  problems, confusion, hallucinations, self-injury and suicidal ideas. The patient is nervous/anxious. The patient is not hyperactive.     Medications: I have reviewed the patient's current medications.  Current Outpatient Medications  Medication Sig Dispense Refill   amphetamine-dextroamphetamine (ADDERALL) 10 MG tablet Take 1 tab in the morning and 1 at noon. (Patient taking differently: 2 in the morning) 60 tablet 0   buPROPion (WELLBUTRIN XL) 300 MG 24 hr tablet TAKE 1 TABLET BY MOUTH EVERY DAY 90 tablet 1   Cholecalciferol (VITAMIN D3) 50 MCG (2000 UT) TABS Take 2,000 Units by mouth daily with breakfast.     clonazePAM (KLONOPIN) 1 MG tablet TAKE 1 TO 1 AND 1/2 TABLETS AT NIGHT FOR SLEEP 45 tablet 3   Cyanocobalamin ER (B-12 DUAL SPECTRUM) 5000 MCG TBCR Take 1 tablet by mouth daily with breakfast.     lithium carbonate 150 MG capsule TAKE 3 CAPSULES (450 MG TOTAL) BY MOUTH EVERY EVENING. 270 capsule 0   pantoprazole (PROTONIX) 40 MG tablet Take 40 mg by mouth daily.     VRAYLAR 1.5 MG capsule TAKE 1 CAPSULE BY MOUTH DAILY. (Patient taking differently: Take 1.5 mg by mouth daily. Every 3rd day) 30 capsule 1   chlorproMAZINE (THORAZINE) 25 MG tablet TAKE 1 TO 2 TABLETS BY MOUTH AT BEDTIME (Patient not taking: Reported on 02/11/2022) 180 tablet 0   topiramate (TOPAMAX) 25 MG tablet TAKE 1 TABLET BY MOUTH TWICE A DAY (Patient not taking: Reported on 02/11/2022) 180 tablet 0   No current facility-administered medications for this visit.  Medication Side Effects: None  Allergies: No Known Allergies  Past Medical History:  Diagnosis Date   Abdominal pain    Anal itching    Anxiety    Breast inflammation    Breast nodule    left   Chronic kidney disease    Cystocele    Decreased libido    Depression    Diverticular disease    Eczema    Fibroadenoma    Galactorrhea    IBS (irritable bowel syndrome)    Mastodynia    Pelvic pain in female    Pelvic relaxation    Rectocele     Seizure-like activity (HCC)    Serotonin syndrome     Family History  Problem Relation Age of Onset   Heart disease Mother        angioplasty   Cancer Father        prostate   Hypertension Father    Depression Father    Bipolar disorder Father    Rheum arthritis Sister    Rheum arthritis Brother    Hypertension Maternal Grandmother    Diabetes Maternal Grandmother        borderline   Hypertension Paternal Grandmother    Arthritis Paternal Grandmother    Stroke Paternal Grandmother    Rheum arthritis Paternal Grandfather    Breast cancer Neg Hx    FHx: sister on olanzapine '5mg'$  and fluoxetine.  Social History   Socioeconomic History   Marital status: Married    Spouse name: Dub Mikes   Number of children: 3   Years of education: College   Highest education level: Not on file  Occupational History   Occupation: Electrical engineer    Comment: Futures trader at [employer] Use   Smoking status: Former    Packs/day: 0.50    Years: 40.00    Total pack years: 20.00    Types: Cigarettes    Quit date: 2018    Years since quitting: 5.8   Smokeless tobacco: Never  Vaping Use   Vaping Use: Never used  Substance and Sexual Activity   Alcohol use: Yes    Alcohol/week: 1.0 standard drink of alcohol    Types: 1 Glasses of wine per week    Comment: one drink about once a month   Drug use: No   Sexual activity: Yes    Birth control/protection: Other-see comments    Comment: pt had hyst  Other Topics Concern   Not on file  Social History Narrative   Lives with husband   Social Determinants of Health   Financial Resource Strain: Not on file  Food Insecurity: Not on file  Transportation Needs: Not on file  Physical Activity: Not on file  Stress: Not on file  Social Connections: Not on file  Intimate Partner Violence: Not on file    Past Medical History, Surgical history, Social history, and Family history were reviewed and updated as appropriate.   Please see  review of systems for further details on the patient's review from today.   Objective:   Physical Exam:  BP 127/88   Pulse 89   LMP 05/20/2010   Physical Exam Constitutional:      General: She is not in acute distress.    Appearance: She is well-developed.  Musculoskeletal:        General: No deformity.  Neurological:     Mental Status: She is alert and oriented to person, place, and time.     Cranial Nerves: No dysarthria.  Coordination: Coordination normal.  Psychiatric:        Attention and Perception: Attention and perception normal. She does not perceive auditory or visual hallucinations.        Mood and Affect: Mood is anxious and depressed. Affect is not labile, blunt or inappropriate.        Speech: Speech normal.        Behavior: Behavior normal. Behavior is not agitated. Behavior is cooperative.        Thought Content: Thought content normal. Thought content is not paranoid or delusional. Thought content does not include homicidal or suicidal ideation. Thought content does not include suicidal plan.        Cognition and Memory: Cognition and memory normal.        Judgment: Judgment normal.     Comments: Insight intact SI resolved. Dep better     Lab Review:     Component Value Date/Time   NA 140 03/01/2020 1731   K 3.8 03/01/2020 1731   CL 102 03/01/2020 1731   CO2 25 03/01/2020 1731   GLUCOSE 92 03/01/2020 1731   BUN 9 03/01/2020 1731   CREATININE 1.17 (H) 03/01/2020 1731   CALCIUM 10.0 03/01/2020 1731   PROT 6.8 03/01/2020 1731   ALBUMIN 4.5 03/01/2020 1731   AST 28 03/01/2020 1731   ALT 23 01/21/2021 0957   ALKPHOS 68 03/01/2020 1731   BILITOT 0.8 03/01/2020 1731   GFRNONAA 55 (L) 03/01/2020 1731   GFRAA >60 10/10/2015 1115       Component Value Date/Time   WBC 7.3 03/01/2020 1731   RBC 5.12 (H) 03/01/2020 1731   HGB 16.5 (H) 03/01/2020 1731   HCT 50.9 (H) 03/01/2020 1731   PLT 211 03/01/2020 1731   MCV 99.4 03/01/2020 1731   MCH 32.2  03/01/2020 1731   MCHC 32.4 03/01/2020 1731   RDW 11.9 03/01/2020 1731   LYMPHSABS 1.2 03/01/2020 1731   MONOABS 0.4 03/01/2020 1731   EOSABS 0.1 03/01/2020 1731   BASOSABS 0.1 03/01/2020 1731    Lithium Lvl  Date Value Ref Range Status  03/01/2020 0.41 (L) 0.60 - 1.20 mmol/L Final    Comment:    Performed at [facility], [place] [address] [place], [place] [address]     No results found for: "PHENYTOIN", "PHENOBARB", "VALPROATE", "CBMZ"   Lithium level received April 27, 2018 was 0.7.  This is probably adequate to help reduce the risk of relapse of depression post [place].  Therefore no med changes.    Vitamin D level April 27, 2018 on supplement was 60 and adequate. .res Assessment: Plan:    Trinitee was seen today for follow-up, depression, anxiety and add.  Diagnoses and all orders for this visit:  Recurrent major depression resistant to treatment ([place])  Generalized anxiety disorder  Weight gain due to medication  Insomnia due to mental condition  Low vitamin D level  First time seizure ([place])    Greater than 50% of 30 min  face to face time with patient was spent on counseling and coordination of care. 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  Reviewed multiple failed medications noted above.  She failed the attempt to wean Zyprexa because of worsening anxiety.  Until switch to Arman Filter has been ok so far.    Wellbutrin reduced to 300 bc GTC sz of unknown origin.  Was better with 450 mg but OK so far with reduction. SZ WU normal   Overall depression is OK, but upset over her  weight.  Defer per her request NAC 600 mg or more daily.  She feels her protein intake is adequate and her weight is stable.  She still has some sleep problems despite several meds to help with sleep. For TR insomnia off label thorazine 25-50 mg HS has been helpful.    Stop Adderall and restart phentermine 37.5 mg AM and combo with topirmate 50 mg BID  Energy is better and no depression.  B12  seemed to help.   Option Aricept DT memory concerns.  She doesn't want more meds.  Her SI is gone after increasing lithium to 450 mg for SI and depression.  Depression resolved.  Discussed potential metabolic side effects associated with atypical antipsychotics, as well as potential risk for movement side effects. Advised pt to contact office if movement side effects occur.  Disc risk NMS with Vraylar, but she has done fine with switch so far.  Vraylar 1.5 mg every 3rd day   We discussed the short-term risks associated with benzodiazepines including sedation and increased fall risk among others.  Discussed long-term side effect risk including dependence, potential withdrawal symptoms, and the potential eventual dose-related risk of dementia.  But recent studies from 2020 dispute this association between benzodiazepines and dementia risk. Newer studies in 2020 do not support an association with dementia. She wants to try to taper Xanax.  She is aware of potential worsening insomnia.  She was given the following schedule.  My suggestion was to continue current dose bc of insomnia now. \ Option taper Xanax.  Counseled patient regarding potential benefits, risks, and side effects of lithium to include potential risk of lithium affecting thyroid and renal function.  Discussed need for periodic lab monitoring to determine drug level and to assess for potential adverse effects.  Counseled patient regarding signs and symptoms of lithium toxicity and advised that they notify office immediately or seek urgent medical attention if experiencing these signs and symptoms.  Patient advised to contact office with any questions or concerns.  Disc lithium and toxicity. lithium to morning to eliminate nocturia. Only taking lithium 300 mg and No SI either. 0.3  on 10/03/20 She forgot to make the switch after the last visit.  Vitamin D 5000 units daily  FU 3 mos  Lynder Parents, MD, DFAPA  Please see After Visit  Summary for patient specific instructions.   Future Appointments  Date Time Provider Rew  04/12/2022  1:30 PM Rex Kras, DO [facility] None     No orders of the defined types were placed in this encounter.      Lynder Parents, MD, DFAPA   -------------------------------

## 2022-02-14 ENCOUNTER — Other Ambulatory Visit: Payer: Self-pay | Admitting: Psychiatry

## 2022-02-14 DIAGNOSIS — F5105 Insomnia due to other mental disorder: Secondary | ICD-10-CM

## 2022-02-23 ENCOUNTER — Other Ambulatory Visit: Payer: Self-pay | Admitting: Psychiatry

## 2022-02-23 DIAGNOSIS — F5105 Insomnia due to other mental disorder: Secondary | ICD-10-CM

## 2022-03-09 ENCOUNTER — Other Ambulatory Visit: Payer: Self-pay | Admitting: Psychiatry

## 2022-03-24 ENCOUNTER — Telehealth: Payer: Self-pay | Admitting: Psychiatry

## 2022-03-24 NOTE — Telephone Encounter (Signed)
Pt called reporting she has been constipated since stating Phentermine. She has tried everything she can try to help. Not working. Contact at [phone]

## 2022-03-25 NOTE — Telephone Encounter (Signed)
If the phentermine is not helping , stop it.  Otherwise: Constipation management can do all of the following or any combination if needed. 1.  Lots of water 2.  Powdered fiber supplement such as MiraLAX, Citrucel, etc. preferably with a meal 3.  2 stool softeners a day 4.  Milk of magnesia or magnesium tablets if needed

## 2022-03-25 NOTE — Telephone Encounter (Signed)
Is constipation common for phentermine?

## 2022-03-26 NOTE — Telephone Encounter (Signed)
LVM with info and to rtc with any questions

## 2022-04-07 ENCOUNTER — Other Ambulatory Visit: Payer: Self-pay | Admitting: Psychiatry

## 2022-04-07 DIAGNOSIS — F339 Major depressive disorder, recurrent, unspecified: Secondary | ICD-10-CM

## 2022-04-12 ENCOUNTER — Ambulatory Visit: Payer: 59 | Admitting: Cardiology

## 2022-04-19 ENCOUNTER — Other Ambulatory Visit: Payer: Self-pay | Admitting: Psychiatry

## 2022-04-19 DIAGNOSIS — F339 Major depressive disorder, recurrent, unspecified: Secondary | ICD-10-CM

## 2022-05-03 ENCOUNTER — Telehealth: Payer: Self-pay | Admitting: Psychiatry

## 2022-05-03 NOTE — Telephone Encounter (Signed)
That is okay as long as no med changes are necessary.

## 2022-05-03 NOTE — Telephone Encounter (Signed)
Pt informed

## 2022-05-03 NOTE — Telephone Encounter (Signed)
Pt called and said that her insurance has changed. She has a high deductible  and would like to push out her appointment for 3 months and come in April. This way she has time to get hber deductible down. Is that ok? If it is someone could call her and let her know. [phone]

## 2022-05-03 NOTE — Telephone Encounter (Signed)
Please advise 

## 2022-05-04 ENCOUNTER — Other Ambulatory Visit: Payer: Self-pay | Admitting: Obstetrics and Gynecology

## 2022-05-04 DIAGNOSIS — Z1231 Encounter for screening mammogram for malignant neoplasm of breast: Secondary | ICD-10-CM

## 2022-05-09 ENCOUNTER — Other Ambulatory Visit: Payer: Self-pay | Admitting: Psychiatry

## 2022-05-09 DIAGNOSIS — T50905A Adverse effect of unspecified drugs, medicaments and biological substances, initial encounter: Secondary | ICD-10-CM

## 2022-05-10 ENCOUNTER — Ambulatory Visit: Payer: 59 | Admitting: Psychiatry

## 2022-05-11 ENCOUNTER — Other Ambulatory Visit: Payer: Self-pay | Admitting: Psychiatry

## 2022-05-11 DIAGNOSIS — F339 Major depressive disorder, recurrent, unspecified: Secondary | ICD-10-CM

## 2022-05-19 ENCOUNTER — Other Ambulatory Visit: Payer: Self-pay | Admitting: Psychiatry

## 2022-05-19 ENCOUNTER — Other Ambulatory Visit: Payer: Self-pay | Admitting: Family Medicine

## 2022-05-19 DIAGNOSIS — K769 Liver disease, unspecified: Secondary | ICD-10-CM

## 2022-05-19 DIAGNOSIS — F339 Major depressive disorder, recurrent, unspecified: Secondary | ICD-10-CM

## 2022-06-08 ENCOUNTER — Other Ambulatory Visit: Payer: Self-pay | Admitting: Psychiatry

## 2022-06-08 ENCOUNTER — Ambulatory Visit
Admission: RE | Admit: 2022-06-08 | Discharge: 2022-06-08 | Disposition: A | Discharge status: Home / Self Care | Payer: 59 | Source: Ambulatory Visit | Attending: Family Medicine | Admitting: Family Medicine

## 2022-06-08 DIAGNOSIS — K769 Liver disease, unspecified: Secondary | ICD-10-CM

## 2022-06-08 DIAGNOSIS — F339 Major depressive disorder, recurrent, unspecified: Secondary | ICD-10-CM

## 2022-06-08 MED ORDER — IOPAMIDOL (ISOVUE-370) INJECTION 76%
80.0000 mL | Freq: Once | INTRAVENOUS | Status: AC | PRN
  Administered 2022-06-08: 80 mL via INTRAVENOUS

## 2022-06-16 IMAGING — MG MM DIGITAL SCREENING BILAT W/ TOMO AND CAD
8 series · 9 of 24 positions shown · non-contrast
Comparison: Previous exam(s).

CLINICAL DATA: Screening.

EXAM:
DIGITAL SCREENING BILATERAL MAMMOGRAM WITH TOMOSYNTHESIS AND CAD
TECHNIQUE: Bilateral screening digital craniocaudal and mediolateral oblique
mammograms were obtained. Bilateral screening digital breast
tomosynthesis was performed. The images were evaluated with
computer-aided detection.

[L CC synth-2D]
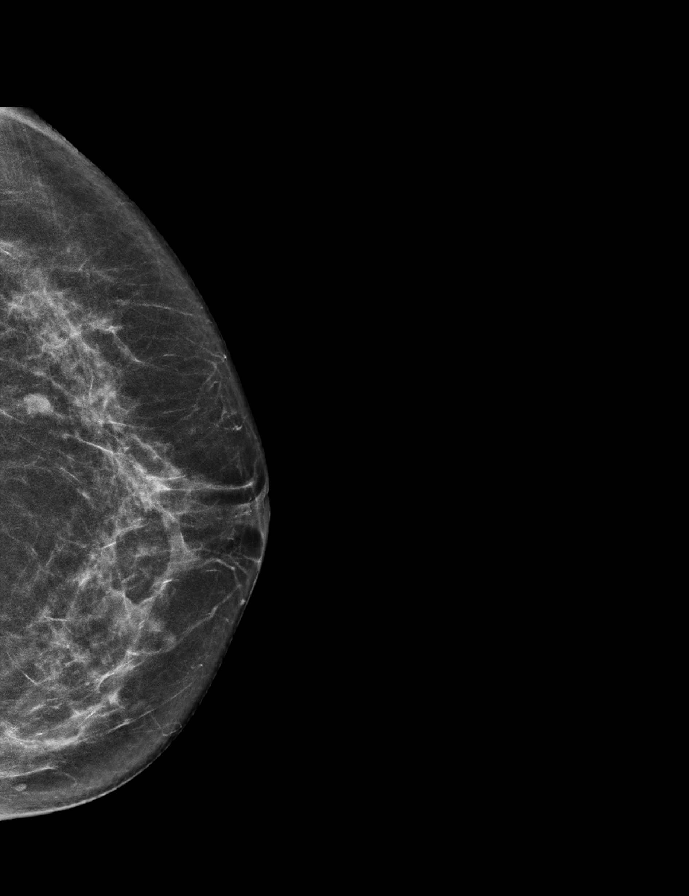

[L MLO synth-2D]
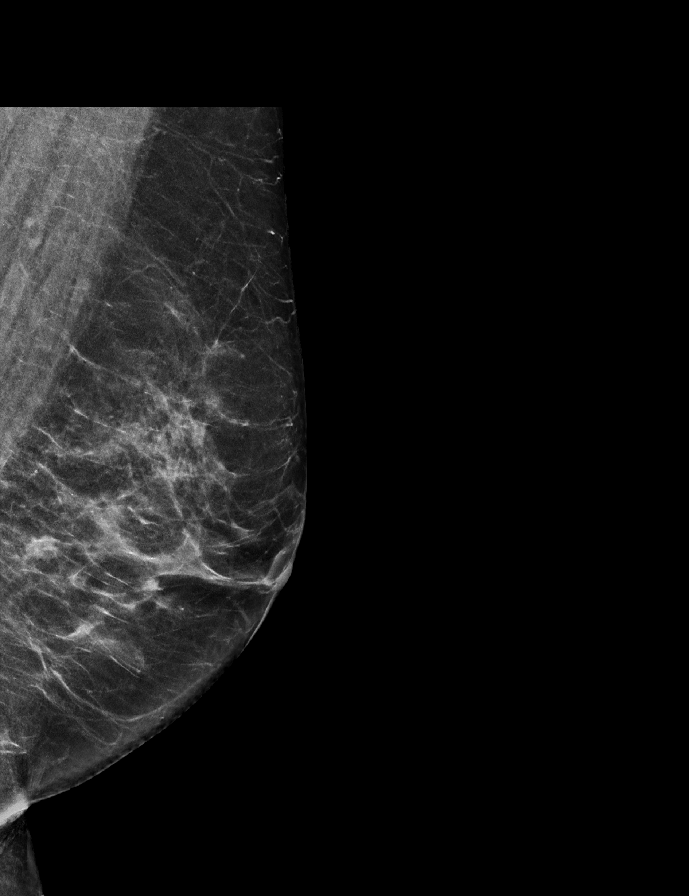

[R CC synth-2D]
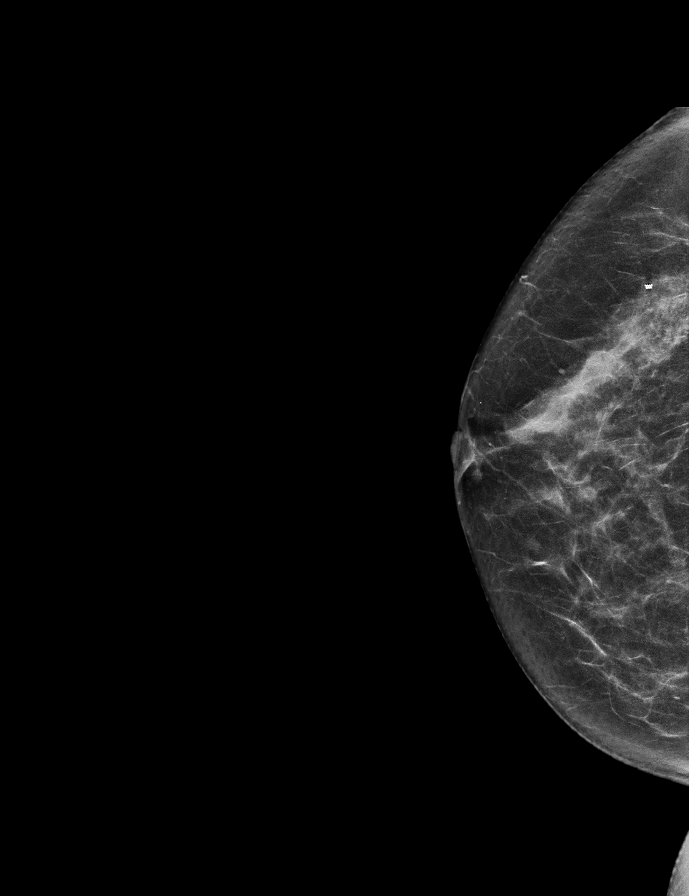

[R MLO synth-2D]
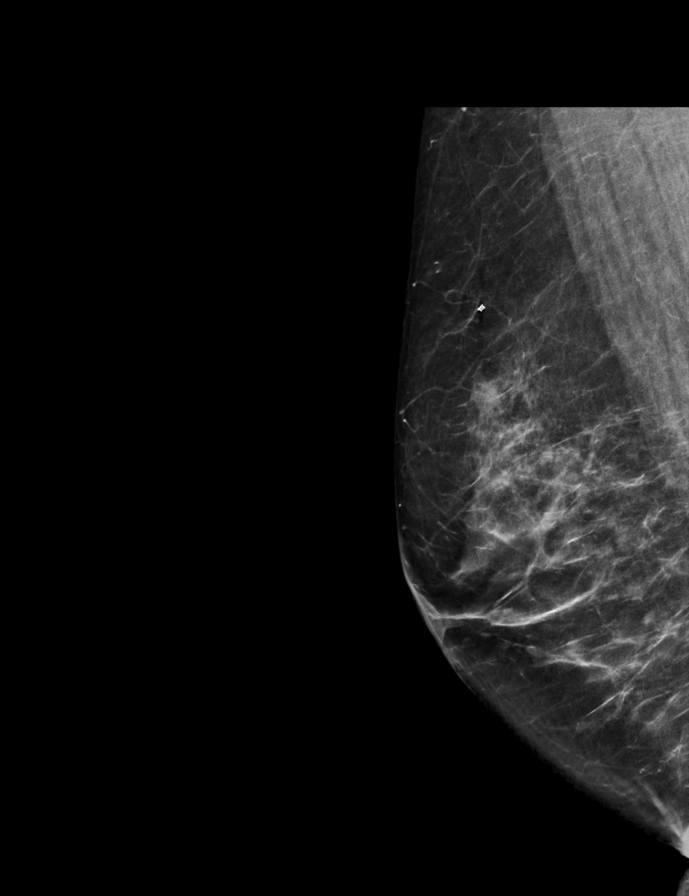

[R MLO tomo · 2 of 59 frames shown]
[frame 20/59]
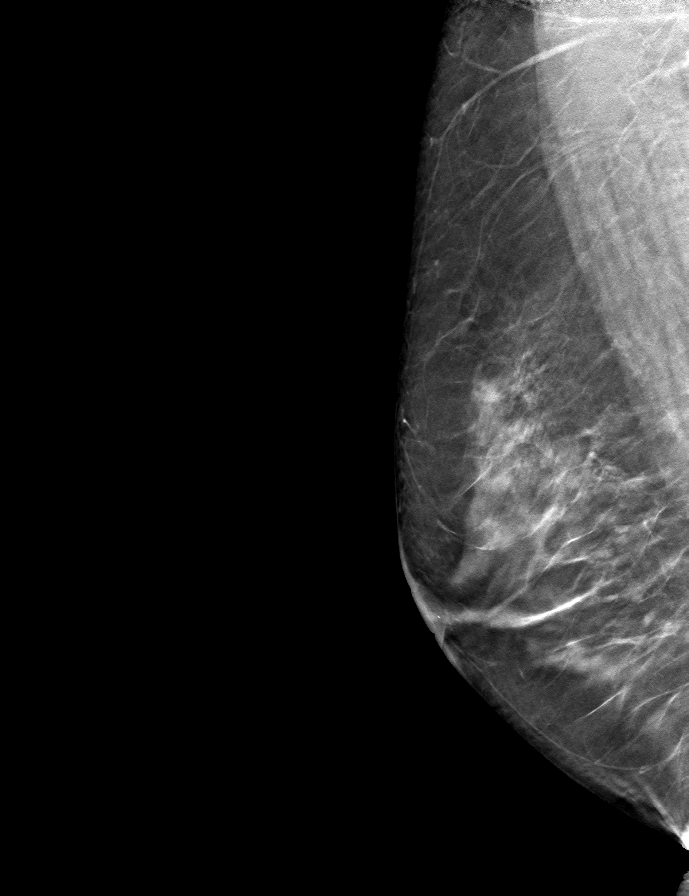
[frame 30/59]
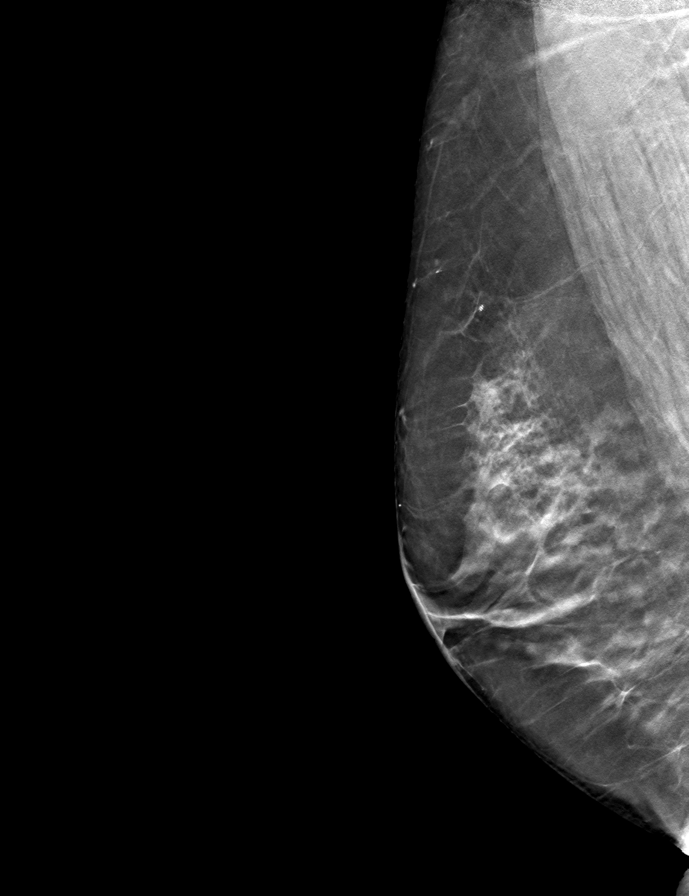

[L CC tomo · tomo slice 31/61.0]
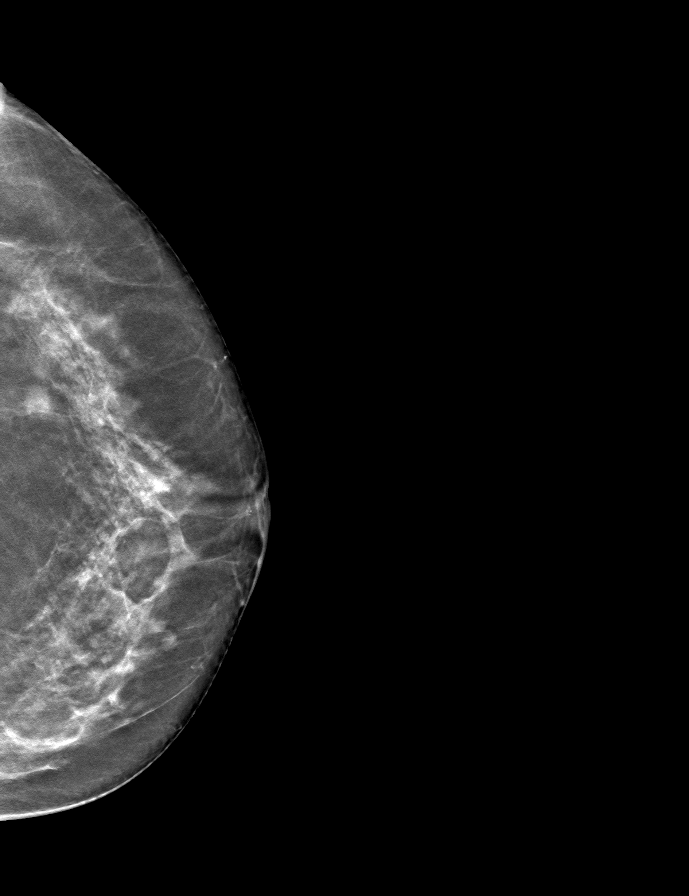

[L MLO tomo · tomo slice 31/60.0]
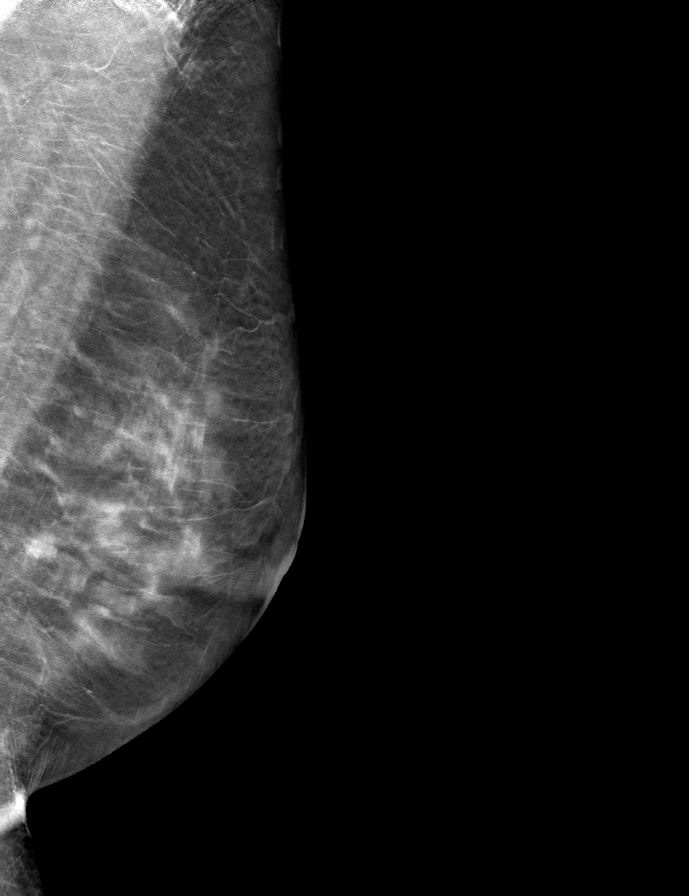

[R CC tomo · tomo slice 31/61.0]
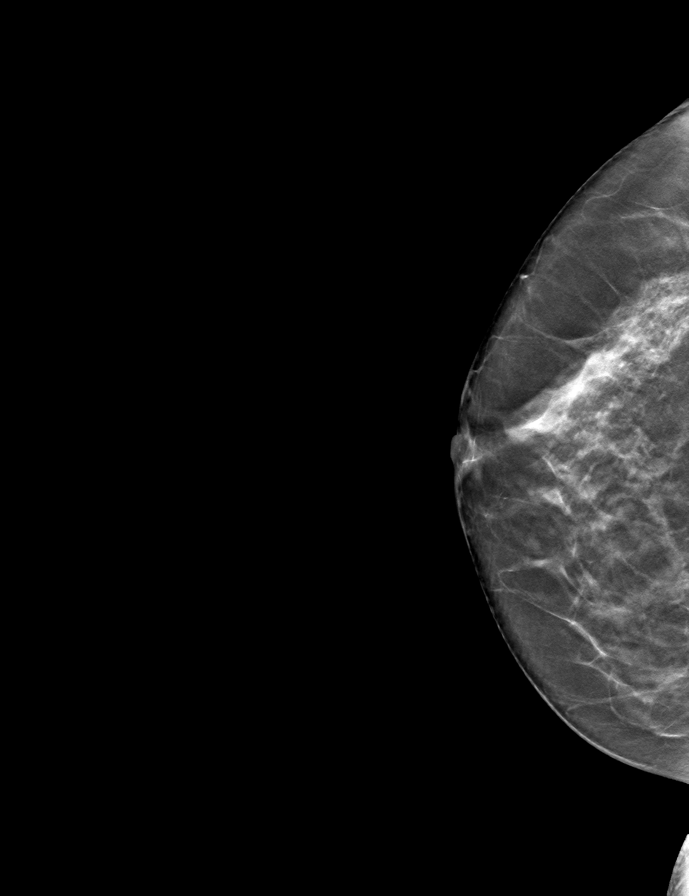

[9 of 24 positions shown; findings below may reference images not displayed]

ACR Breast Density Category c: The breast tissue is heterogeneously
dense, which may obscure small masses.
FINDINGS: In the left breast, a possible mass warrants further evaluation. In
the right breast, no findings suspicious for malignancy.
IMPRESSION: Further evaluation is suggested for a possible mass in the left
breast.

RECOMMENDATION:
Diagnostic mammogram and possibly ultrasound of the left breast.
(Code:C3-P-XXB)

The patient will be contacted regarding the findings, and additional
imaging will be scheduled.

BI-RADS CATEGORY  0: Incomplete. Need additional imaging evaluation
and/or prior mammograms for comparison.

## 2022-06-23 ENCOUNTER — Ambulatory Visit
Admission: RE | Admit: 2022-06-23 | Discharge: 2022-06-23 | Disposition: A | Discharge status: Home / Self Care | Payer: 59 | Source: Ambulatory Visit | Attending: Obstetrics and Gynecology | Admitting: Obstetrics and Gynecology

## 2022-06-23 DIAGNOSIS — Z1231 Encounter for screening mammogram for malignant neoplasm of breast: Secondary | ICD-10-CM

## 2022-06-25 ENCOUNTER — Other Ambulatory Visit: Payer: Self-pay | Admitting: Obstetrics and Gynecology

## 2022-06-25 DIAGNOSIS — R928 Other abnormal and inconclusive findings on diagnostic imaging of breast: Secondary | ICD-10-CM

## 2022-07-05 ENCOUNTER — Other Ambulatory Visit (HOSPITAL_BASED_OUTPATIENT_CLINIC_OR_DEPARTMENT_OTHER): Payer: Self-pay

## 2022-07-05 ENCOUNTER — Ambulatory Visit (INDEPENDENT_AMBULATORY_CARE_PROVIDER_SITE_OTHER): Payer: 59 | Admitting: Psychiatry

## 2022-07-05 ENCOUNTER — Encounter: Payer: Self-pay | Admitting: Psychiatry

## 2022-07-05 DIAGNOSIS — F411 Generalized anxiety disorder: Secondary | ICD-10-CM

## 2022-07-05 DIAGNOSIS — F900 Attention-deficit hyperactivity disorder, predominantly inattentive type: Secondary | ICD-10-CM

## 2022-07-05 DIAGNOSIS — T50905A Adverse effect of unspecified drugs, medicaments and biological substances, initial encounter: Secondary | ICD-10-CM

## 2022-07-05 DIAGNOSIS — R635 Abnormal weight gain: Secondary | ICD-10-CM

## 2022-07-05 DIAGNOSIS — F5105 Insomnia due to other mental disorder: Secondary | ICD-10-CM

## 2022-07-05 DIAGNOSIS — F339 Major depressive disorder, recurrent, unspecified: Secondary | ICD-10-CM

## 2022-07-05 MED ORDER — LISDEXAMFETAMINE DIMESYLATE 50 MG PO CAPS
50.0000 mg | ORAL_CAPSULE | Freq: Every day | ORAL | 0 refills | Status: DC
  Filled 2022-07-05: qty 30, 30d supply, fill #0

## 2022-07-05 NOTE — Progress Notes (Signed)
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 in 2020 do not support an association with dementia. Ok on clonazepam 1.5 mg HS with sleep.  Counseled patient regarding potential benefits, risks, and side effects of lithium to include potential risk of lithium affecting thyroid and renal function.  Discussed need for periodic lab monitoring to determine drug level and to assess for potential adverse effects.  Counseled patient regarding signs and symptoms of lithium toxicity and advised that they notify office immediately or seek urgent medical attention if experiencing these signs and symptoms.  Patient advised to contact office with any questions or concerns.  Disc lithium and toxicity. lithium to morning to eliminate nocturia. Only taking lithium 300 mg and No SI either. 0.3  on 10/03/20 She forgot to make the switch after the last visit.  Vitamin D 5000 units daily  FU 3 mos  Lynder Parents, MD, DFAPA  Please see After Visit Summary  for patient specific instructions.   Future Appointments  Date Time Provider [place]  07/13/2022 10:10 AM [facility] DIAG [place] 2 [facility]  07/13/2022 10:20 AM [facility] [place] 2 [facility]  07/13/2022 10:25 AM [facility] [place] 2 [facility]  01/04/2023  1:00 PM Cottle, [employer] MD CP-CP None     No orders of the defined types were placed in this encounter.      Lynder Parents, MD, DFAPA   -------------------------------

## 2022-07-13 ENCOUNTER — Other Ambulatory Visit: Payer: Self-pay | Admitting: Psychiatry

## 2022-07-13 ENCOUNTER — Ambulatory Visit
Admission: RE | Admit: 2022-07-13 | Discharge: 2022-07-13 | Disposition: A | Discharge status: Home / Self Care | Payer: 59 | Source: Ambulatory Visit | Attending: Obstetrics and Gynecology | Admitting: Obstetrics and Gynecology

## 2022-07-13 ENCOUNTER — Other Ambulatory Visit (HOSPITAL_BASED_OUTPATIENT_CLINIC_OR_DEPARTMENT_OTHER): Payer: Self-pay

## 2022-07-13 DIAGNOSIS — R928 Other abnormal and inconclusive findings on diagnostic imaging of breast: Secondary | ICD-10-CM

## 2022-07-13 DIAGNOSIS — F339 Major depressive disorder, recurrent, unspecified: Secondary | ICD-10-CM

## 2022-07-16 ENCOUNTER — Other Ambulatory Visit: Payer: Self-pay | Admitting: Obstetrics and Gynecology

## 2022-07-16 DIAGNOSIS — N6320 Unspecified lump in the left breast, unspecified quadrant: Secondary | ICD-10-CM

## 2022-07-22 ENCOUNTER — Encounter (HOSPITAL_BASED_OUTPATIENT_CLINIC_OR_DEPARTMENT_OTHER): Payer: Self-pay

## 2022-07-22 ENCOUNTER — Other Ambulatory Visit (HOSPITAL_BASED_OUTPATIENT_CLINIC_OR_DEPARTMENT_OTHER): Payer: Self-pay

## 2022-07-23 ENCOUNTER — Other Ambulatory Visit: Payer: Self-pay | Admitting: Psychiatry

## 2022-07-27 ENCOUNTER — Telehealth: Payer: Self-pay | Admitting: Psychiatry

## 2022-07-27 ENCOUNTER — Other Ambulatory Visit: Payer: Self-pay

## 2022-07-27 ENCOUNTER — Other Ambulatory Visit (HOSPITAL_BASED_OUTPATIENT_CLINIC_OR_DEPARTMENT_OTHER): Payer: Self-pay

## 2022-07-27 DIAGNOSIS — F900 Attention-deficit hyperactivity disorder, predominantly inattentive type: Secondary | ICD-10-CM

## 2022-07-27 MED ORDER — LISDEXAMFETAMINE DIMESYLATE 50 MG PO CAPS: 50.0000 mg | ORAL_CAPSULE | Freq: Every day | ORAL | 0 refills | Status: DC

## 2022-07-27 NOTE — Telephone Encounter (Signed)
Pended. Canceled at [employer].

## 2022-07-27 NOTE — Telephone Encounter (Signed)
Next appt is 01/04/23. Vyvanse 50 mg called to:  [id number] - [place], [place] - [address] AT [facility] [address] [facility] [address]   Phone: [phone]  Fax: [phone]    Its in stock

## 2022-07-27 NOTE — Telephone Encounter (Signed)
Pt call back. Never picked up Vyvance sent 4/1 to med center. [facility] [address] In stock

## 2022-08-03 ENCOUNTER — Other Ambulatory Visit (HOSPITAL_BASED_OUTPATIENT_CLINIC_OR_DEPARTMENT_OTHER): Payer: Self-pay

## 2022-08-23 ENCOUNTER — Other Ambulatory Visit: Payer: Self-pay | Admitting: Psychiatry

## 2022-08-23 ENCOUNTER — Telehealth: Payer: Self-pay | Admitting: Psychiatry

## 2022-08-23 MED ORDER — AMPHETAMINE-DEXTROAMPHET ER 30 MG PO CP24: 30.0000 mg | ORAL_CAPSULE | Freq: Every day | ORAL | 0 refills | Status: DC

## 2022-08-23 NOTE — Telephone Encounter (Signed)
Sent Adderall XR 30 AM as the closest alternative to Vyvanse but it won't last as long.  [facility]

## 2022-08-23 NOTE — Telephone Encounter (Signed)
Patient notified

## 2022-08-23 NOTE — Telephone Encounter (Signed)
Pt called and said that no one has the vyvanse generic in stock. The med center at [place] doesn't have it. She is wondering if you can prescribe something else that is similar to vyvanse. Please call her at [phone]

## 2022-09-01 ENCOUNTER — Encounter: Payer: Self-pay | Admitting: Gastroenterology

## 2022-09-06 ENCOUNTER — Telehealth: Payer: Self-pay | Admitting: Psychiatry

## 2022-09-06 NOTE — Telephone Encounter (Signed)
LF 5/20

## 2022-09-06 NOTE — Telephone Encounter (Signed)
Patient lvm at 11:19 asking if she could up dosage of Adderall. " Current dosage doesn't seem to be working." Ph: [phone]

## 2022-09-08 NOTE — Telephone Encounter (Signed)
LVM to [facility] 

## 2022-09-08 NOTE — Telephone Encounter (Signed)
Pt reported she doesn't feel like she is taking anything on Adderall dose of 30 mg XR qd.  York Spaniel you warned her that this might be an issue. She last filled 5/20, due 6/17. F/U 10/1.

## 2022-09-16 ENCOUNTER — Telehealth: Payer: Self-pay | Admitting: Gastroenterology

## 2022-09-16 ENCOUNTER — Other Ambulatory Visit: Payer: Self-pay | Admitting: Psychiatry

## 2022-09-16 DIAGNOSIS — F339 Major depressive disorder, recurrent, unspecified: Secondary | ICD-10-CM

## 2022-09-16 NOTE — Telephone Encounter (Signed)
Patient reports she takes every third day.

## 2022-09-16 NOTE — Telephone Encounter (Signed)
Left message for patient to call back  

## 2022-09-16 NOTE — Telephone Encounter (Signed)
Patient is calling wishing to speak with a nurse regarding Dr Myrtie Neither' recall recommendations she says she is confused as to why she is not due until 2026 now. Please advise

## 2022-09-17 ENCOUNTER — Other Ambulatory Visit: Payer: Self-pay | Admitting: Psychiatry

## 2022-09-17 MED ORDER — DYANAVEL XR 20 MG PO CHER: 20.0000 mg | CHEWABLE_EXTENDED_RELEASE_TABLET | Freq: Every morning | ORAL | 0 refills | Status: DC

## 2022-09-17 NOTE — Telephone Encounter (Signed)
I sent a RX for Dyanavel.  I got message it was not on formulary.  But maybe that just means it needs a PA?  If she picks it up she will need to download coupon from the Internet to make it affordable. I cannot go up and Adderall XR because the insurance company will not cover 2 capsules daily.

## 2022-09-20 ENCOUNTER — Telehealth: Payer: Self-pay

## 2022-09-20 NOTE — Telephone Encounter (Signed)
Called and spoke with patient. I reviewed original recall recommendation of 5 years with the patient. I explained to her that since she was due for a recall this year Dr. Myrtie Neither did review her previous colonoscopy report and pathology results and has recommended that her recall be extended until 2026, which will be a 7 year recall from her previous colonoscopy. I explained to patient that recall is based on type and size of polyps, family history, and etc. Pt is aware that Dr. Myrtie Neither has reviewed updated GI guidelines as well and this is the new recommendation. Pt knows to expect a new recall letter in 2026. Pt currently denies any GI concerns or symptoms. Otherwise, pt has been advised to contact the office in the interim to schedule an office visit if she has any symptoms. Pt verbalized understanding and had no concerns at the end of the call.

## 2022-09-20 NOTE — Telephone Encounter (Signed)
Initiated a Prior Authorization with [facility] for DYANAVEL XR 20 MG TABLETS, requirements are pt must have tried and failed ALL formularies: generic Adderall XR, generic Focalin XR, generic Vyvanse, methylphenidate XR (generic Concerta, Metadate CD, ER, Ritalin LA)  Just wanted to confirm before proceeding, if not tried all formularies listed is there a medical reason she can not try those?

## 2022-09-20 NOTE — Telephone Encounter (Signed)
Dont bother with PA.

## 2022-09-20 NOTE — Telephone Encounter (Signed)
I sent you a message on their requirements, I have not submitted PA yet

## 2022-09-20 NOTE — Telephone Encounter (Signed)
Patient notified of Rx and that PA is required.

## 2022-09-20 NOTE — Telephone Encounter (Signed)
Biomedical scientist.

## 2022-09-20 NOTE — Telephone Encounter (Signed)
Noted thanks °

## 2022-09-21 ENCOUNTER — Telehealth: Payer: Self-pay | Admitting: Psychiatry

## 2022-09-21 ENCOUNTER — Other Ambulatory Visit: Payer: Self-pay | Admitting: Psychiatry

## 2022-09-21 DIAGNOSIS — F339 Major depressive disorder, recurrent, unspecified: Secondary | ICD-10-CM

## 2022-09-21 NOTE — Telephone Encounter (Signed)
Patient was called last week to report Rx for Dyanavel had been sent, needed a PA and were waiting to see how that would go. Per notes from yesterday PA would require patient to try different formularies and Dr. Jennelle Human said not to bother. Will ask him where to go from here.

## 2022-09-21 NOTE — Telephone Encounter (Signed)
Linsi called and LM at 2:19 still waiting on a follow up call about changing her Adderall to something else.  Please call.

## 2022-09-21 NOTE — Telephone Encounter (Signed)
Patient asking where to go next with Adderall or other stimulant. She last filled Adderall 5/20 so is due for a RF.

## 2022-09-24 ENCOUNTER — Other Ambulatory Visit: Payer: Self-pay | Admitting: Psychiatry

## 2022-09-24 MED ORDER — METHYLPHENIDATE HCL ER (OSM) 54 MG PO TBCR: 54.0000 mg | EXTENDED_RELEASE_TABLET | ORAL | 0 refills | Status: DC

## 2022-09-24 NOTE — Telephone Encounter (Signed)
I sent in RX Concerta.  If she doesn't find that helpful she will need to schedule appt to discuss further changes.  I will not keep switching meds over the phone.

## 2022-09-24 NOTE — Telephone Encounter (Signed)
LVM to [facility] 

## 2022-09-27 NOTE — Telephone Encounter (Signed)
Patient picked up 6/21.

## 2022-10-21 ENCOUNTER — Other Ambulatory Visit: Payer: Self-pay | Admitting: Psychiatry

## 2022-10-21 NOTE — Telephone Encounter (Signed)
Pt called requesting a refill on her concerta 54 mg.[facility] [address]

## 2022-10-21 NOTE — Telephone Encounter (Signed)
LF 6/21, due 7/19.

## 2022-10-21 NOTE — Telephone Encounter (Signed)
Pended.

## 2022-10-22 MED ORDER — METHYLPHENIDATE HCL ER (OSM) 54 MG PO TBCR: 54.0000 mg | EXTENDED_RELEASE_TABLET | ORAL | 0 refills | Status: DC

## 2022-10-26 ENCOUNTER — Telehealth: Payer: Self-pay | Admitting: Psychiatry

## 2022-10-26 NOTE — Telephone Encounter (Signed)
Please call patient to schedule an earlier appt with Dr. Jennelle Human. He said appt is needed to change medication.

## 2022-10-26 NOTE — Telephone Encounter (Signed)
Next visit is 01/04/23. [name] states that her Concerta 54 mg is not helping her at all. She would like to have it increased? Her phone number is [phone]. Pharmacy is:  [id number] - [place],  - [address] AT [facility] [address] [facility] [address]   Phone: [phone]  Fax: [phone]

## 2022-10-27 NOTE — Telephone Encounter (Signed)
Apt 9/4 and on canc list

## 2022-11-19 ENCOUNTER — Other Ambulatory Visit: Payer: Self-pay | Admitting: Psychiatry

## 2022-11-19 DIAGNOSIS — F339 Major depressive disorder, recurrent, unspecified: Secondary | ICD-10-CM

## 2022-11-25 ENCOUNTER — Other Ambulatory Visit: Payer: Self-pay | Admitting: Psychiatry

## 2022-11-25 DIAGNOSIS — F339 Major depressive disorder, recurrent, unspecified: Secondary | ICD-10-CM

## 2022-12-02 ENCOUNTER — Telehealth: Payer: Self-pay | Admitting: Psychiatry

## 2022-12-02 NOTE — Telephone Encounter (Signed)
Pt called at 9:05a to request a different refill for Klonopin.  She said she got a text from them they don't have the medication and don't know when they will get it. Pls send to:  [id number] - [place], [place] - [address] AT [facility] [address], [place] [address] Phone: [phone]  Fax: [phone]    Next appt 9/4

## 2022-12-02 NOTE — Telephone Encounter (Signed)
Called pharmacy and they do have 1 mg in stock, it is just a different manufacturer. Notified patient.

## 2022-12-03 ENCOUNTER — Other Ambulatory Visit: Payer: Self-pay | Admitting: *Deleted

## 2022-12-03 DIAGNOSIS — R233 Spontaneous ecchymoses: Secondary | ICD-10-CM

## 2022-12-08 ENCOUNTER — Ambulatory Visit (INDEPENDENT_AMBULATORY_CARE_PROVIDER_SITE_OTHER): Payer: 59 | Admitting: Psychiatry

## 2022-12-08 ENCOUNTER — Encounter: Payer: Self-pay | Admitting: Psychiatry

## 2022-12-08 DIAGNOSIS — F339 Major depressive disorder, recurrent, unspecified: Secondary | ICD-10-CM

## 2022-12-08 DIAGNOSIS — F900 Attention-deficit hyperactivity disorder, predominantly inattentive type: Secondary | ICD-10-CM

## 2022-12-08 DIAGNOSIS — F5105 Insomnia due to other mental disorder: Secondary | ICD-10-CM

## 2022-12-08 DIAGNOSIS — F411 Generalized anxiety disorder: Secondary | ICD-10-CM

## 2022-12-08 DIAGNOSIS — R7989 Other specified abnormal findings of blood chemistry: Secondary | ICD-10-CM

## 2022-12-08 DIAGNOSIS — T50905A Adverse effect of unspecified drugs, medicaments and biological substances, initial encounter: Secondary | ICD-10-CM

## 2022-12-08 DIAGNOSIS — R635 Abnormal weight gain: Secondary | ICD-10-CM

## 2022-12-08 MED ORDER — LISDEXAMFETAMINE DIMESYLATE 50 MG PO CAPS
50.0000 mg | ORAL_CAPSULE | Freq: Every day | ORAL | 0 refills | Status: DC
Start: 2022-12-08 — End: 2022-12-13

## 2022-12-08 MED ORDER — LITHIUM CARBONATE 150 MG PO CAPS
450.0000 mg | ORAL_CAPSULE | Freq: Every evening | ORAL | 1 refills | Status: DC
Start: 2022-12-08 — End: 2023-04-12

## 2022-12-08 NOTE — Progress Notes (Addendum)
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 but she has done fine with switch so far.  Vraylar 1.5 mg every 3rd day helping dep but she wants to try to stop after feeling better with incr lithium to 450 mg daily.   Disc risk in stopping.  She's not dep now.  We discussed the short-term risks associated with benzodiazepines including sedation and increased fall risk among others.  Discussed long-term side effect risk including dependence, potential withdrawal symptoms, and the potential eventual dose-related risk of dementia.  But recent studies from 2020 dispute this association between benzodiazepines and dementia risk. Newer studies in 2020 do not support an association with dementia. Ok on clonazepam 1.5 mg HS with sleep.  Counseled patient regarding potential benefits, risks, and side effects of lithium to include potential risk of lithium affecting thyroid and renal function.  Discussed need for periodic lab monitoring to determine drug level  and to assess for potential adverse effects.  Counseled patient regarding signs and symptoms of lithium toxicity and advised that they notify office immediately or seek urgent medical attention if experiencing these signs and symptoms.  Patient advised to contact office with any questions or concerns.  Disc lithium and toxicity. lithium to morning to eliminate nocturia. Continue lithium 450 mg HS resolved recurrent SI  She forgot to make the switch after the last visit.  Vitamin D 5000 units daily  FU 4-6 mos  Meredith Staggers, MD, DFAPA  Please see After Visit Summary for patient specific instructions.   Future Appointments  Date Time Provider Department Center  01/14/2023 11:00 AM [facility] DIAG [place] 1 [facility]  01/14/2023 11:10 AM [facility] [place] 1 [facility]  02/17/2023  2:00 PM [facility] PHLEBOTOMIST [facility] None  02/17/2023  2:15 PM Rana Snare, NP [facility] None  04/12/2023  1:00 PM Cottle, Steva Ready., MD [facility] None     No orders of the defined types were placed in this encounter.      Meredith Staggers, MD, DFAPA   -------------------------------

## 2022-12-13 ENCOUNTER — Telehealth: Payer: Self-pay | Admitting: Psychiatry

## 2022-12-13 ENCOUNTER — Other Ambulatory Visit (HOSPITAL_BASED_OUTPATIENT_CLINIC_OR_DEPARTMENT_OTHER): Payer: Self-pay

## 2022-12-13 ENCOUNTER — Other Ambulatory Visit: Payer: Self-pay

## 2022-12-13 DIAGNOSIS — F900 Attention-deficit hyperactivity disorder, predominantly inattentive type: Secondary | ICD-10-CM

## 2022-12-13 MED ORDER — LISDEXAMFETAMINE DIMESYLATE 50 MG PO CAPS
50.0000 mg | ORAL_CAPSULE | Freq: Every day | ORAL | 0 refills | Status: DC
Start: 2022-12-13 — End: 2023-01-11
  Filled 2022-12-13: qty 30, 30d supply, fill #0

## 2022-12-13 NOTE — Telephone Encounter (Signed)
Patient lvm requesting the Vyvanse switch to the [facility] pharmacy on [address]. She stated it is not available at her usual pharmacy.Patient is aware this is an early refill, but would like to be sure it will still be available.

## 2022-12-13 NOTE — Telephone Encounter (Signed)
Pended to [address], canceled at [facility]

## 2022-12-19 ENCOUNTER — Other Ambulatory Visit: Payer: Self-pay | Admitting: Psychiatry

## 2022-12-19 DIAGNOSIS — F339 Major depressive disorder, recurrent, unspecified: Secondary | ICD-10-CM

## 2023-01-04 ENCOUNTER — Ambulatory Visit: Payer: 59 | Admitting: Psychiatry

## 2023-01-11 ENCOUNTER — Other Ambulatory Visit: Payer: Self-pay | Admitting: Psychiatry

## 2023-01-11 ENCOUNTER — Other Ambulatory Visit (HOSPITAL_BASED_OUTPATIENT_CLINIC_OR_DEPARTMENT_OTHER): Payer: Self-pay

## 2023-01-11 DIAGNOSIS — F900 Attention-deficit hyperactivity disorder, predominantly inattentive type: Secondary | ICD-10-CM

## 2023-01-11 MED ORDER — LISDEXAMFETAMINE DIMESYLATE 50 MG PO CAPS
50.0000 mg | ORAL_CAPSULE | Freq: Every day | ORAL | 0 refills | Status: DC
Start: 2023-01-11 — End: 2023-01-17
  Filled 2023-01-11: qty 30, 30d supply, fill #0

## 2023-01-12 ENCOUNTER — Other Ambulatory Visit (HOSPITAL_COMMUNITY): Payer: Self-pay

## 2023-01-12 ENCOUNTER — Telehealth: Payer: Self-pay | Admitting: Psychiatry

## 2023-01-12 NOTE — Telephone Encounter (Signed)
[name] called at 11:00 to report that she feels she needs an increase of her Vyvanse  She would like to increase to 70mg . Will you send in a new prescription for this dose.  She has not picked the 50mg .  Please call her to discuss.

## 2023-01-12 NOTE — Telephone Encounter (Signed)
Spoke with patient she is asking for an increase of the vyvanse dt trying the  50 mg dose  for a month and feeling no different.  Would like to increase to 70 mg

## 2023-01-12 NOTE — Telephone Encounter (Signed)
Called pt to further discuss increase request. Lvm

## 2023-01-14 ENCOUNTER — Ambulatory Visit
Admission: RE | Admit: 2023-01-14 | Discharge: 2023-01-14 | Disposition: A | Discharge status: Home / Self Care | Payer: 59 | Source: Ambulatory Visit | Attending: Obstetrics and Gynecology | Admitting: Obstetrics and Gynecology

## 2023-01-14 ENCOUNTER — Other Ambulatory Visit: Payer: Self-pay | Admitting: Obstetrics and Gynecology

## 2023-01-14 DIAGNOSIS — N6320 Unspecified lump in the left breast, unspecified quadrant: Secondary | ICD-10-CM

## 2023-01-14 NOTE — Telephone Encounter (Signed)
Told patient that Dr. Jennelle Human was out of the office until Monday and I could not address the dose change with him. She said it had been discussed. She said she had not picked up the 50 mg. I told her I could address it with Dr. Jennelle Human on Monday and would get back to her then. She was okay with that.

## 2023-01-14 NOTE — Telephone Encounter (Signed)
Pt called back today at 9:07 asking about the status of the refill of Vyvanse 70mg  to [facility] [address].   Next appt 1/7

## 2023-01-17 ENCOUNTER — Other Ambulatory Visit (HOSPITAL_BASED_OUTPATIENT_CLINIC_OR_DEPARTMENT_OTHER): Payer: Self-pay

## 2023-01-17 ENCOUNTER — Other Ambulatory Visit: Payer: Self-pay

## 2023-01-17 MED ORDER — LISDEXAMFETAMINE DIMESYLATE 70 MG PO CAPS
70.0000 mg | ORAL_CAPSULE | Freq: Every day | ORAL | 0 refills | Status: DC
  Filled 2023-01-17: qty 30, 30d supply, fill #0

## 2023-01-17 NOTE — Telephone Encounter (Signed)
Pended.

## 2023-01-18 ENCOUNTER — Other Ambulatory Visit (HOSPITAL_BASED_OUTPATIENT_CLINIC_OR_DEPARTMENT_OTHER): Payer: Self-pay

## 2023-01-18 ENCOUNTER — Other Ambulatory Visit: Payer: Self-pay

## 2023-02-16 ENCOUNTER — Other Ambulatory Visit (HOSPITAL_BASED_OUTPATIENT_CLINIC_OR_DEPARTMENT_OTHER): Payer: Self-pay

## 2023-02-16 ENCOUNTER — Telehealth: Payer: Self-pay

## 2023-02-16 ENCOUNTER — Other Ambulatory Visit: Payer: Self-pay | Admitting: Psychiatry

## 2023-02-16 MED ORDER — LISDEXAMFETAMINE DIMESYLATE 70 MG PO CAPS
70.0000 mg | ORAL_CAPSULE | Freq: Every day | ORAL | 0 refills | Status: DC
  Filled 2023-02-16: qty 30, 30d supply, fill #0

## 2023-02-16 NOTE — Telephone Encounter (Signed)
Patient called and left a voicemail to cancel upcoming appointment on 02/18/23. Called patient back to reschedule appointment on 02/16/23 @ 10:15 AM, patient did not want to reschedule at this time.

## 2023-02-16 NOTE — Telephone Encounter (Signed)
Lf 10/15; lv 09/4; nv 01/7

## 2023-02-17 ENCOUNTER — Inpatient Hospital Stay: Payer: 59 | Admitting: Nurse Practitioner

## 2023-02-17 ENCOUNTER — Inpatient Hospital Stay: Payer: 59

## 2023-02-18 ENCOUNTER — Other Ambulatory Visit: Payer: Self-pay | Admitting: Psychiatry

## 2023-02-18 NOTE — Telephone Encounter (Signed)
LF 10/7; LV 09/4; NV 1/7

## 2023-02-27 ENCOUNTER — Other Ambulatory Visit: Payer: Self-pay

## 2023-02-27 ENCOUNTER — Encounter (HOSPITAL_BASED_OUTPATIENT_CLINIC_OR_DEPARTMENT_OTHER): Payer: Self-pay

## 2023-02-27 ENCOUNTER — Emergency Department (HOSPITAL_BASED_OUTPATIENT_CLINIC_OR_DEPARTMENT_OTHER)
Admission: EM | Admit: 2023-02-27 | Discharge: 2023-02-27 | Disposition: A | Discharge status: Home / Self Care | Payer: 59 | Attending: Emergency Medicine | Admitting: Emergency Medicine

## 2023-02-27 DIAGNOSIS — R300 Dysuria: Secondary | ICD-10-CM | POA: Insufficient documentation

## 2023-02-27 DIAGNOSIS — Z20822 Contact with and (suspected) exposure to covid-19: Secondary | ICD-10-CM | POA: Insufficient documentation

## 2023-02-27 LAB — RESP PANEL BY RT-PCR (RSV, FLU A&B, COVID)  RVPGX2
Influenza A by PCR: NEGATIVE
Influenza B by PCR: NEGATIVE
Resp Syncytial Virus by PCR: NEGATIVE
SARS Coronavirus 2 by RT PCR: NEGATIVE

## 2023-02-27 LAB — URINALYSIS, ROUTINE W REFLEX MICROSCOPIC
Bilirubin Urine: NEGATIVE
Glucose, UA: NEGATIVE mg/dL
Hgb urine dipstick: NEGATIVE
Ketones, ur: NEGATIVE mg/dL
Leukocytes,Ua: NEGATIVE
Nitrite: NEGATIVE
Protein, ur: NEGATIVE mg/dL
Specific Gravity, Urine: 1.007 (ref 1.005–1.030)
pH: 7.5 (ref 5.0–8.0)

## 2023-02-27 NOTE — ED Triage Notes (Signed)
She reports a recalcitrant uti, for which she has been prescribed Macrodantin; thence SMZ-TMP; and is currently on Cipro. She c/o persistent dysuria. She is ambulatory and in no  distress.

## 2023-02-27 NOTE — ED Notes (Signed)
Pt alert and oriented X 4 at the time of discharge. RR even and unlabored. No acute distress noted. Pt verbalized understanding of discharge instructions as discussed. Pt ambulatory to lobby at time of discharge.

## 2023-02-27 NOTE — ED Provider Notes (Signed)
[place] EMERGENCY DEPARTMENT AT [facility] Provider Note   CSN: [id number] Arrival date & time: 02/27/23  0940     History  Chief Complaint  Patient presents with   Urinary Tract Infection    [name] is a 57 y.o. female.  Patient with noncontributory past medical history presents today with complaints of dysuria.  She states that same began initially on 11/8 and she was seen for same and diagnosed with a UTI.  She was started on Macrobid which she completed without resolution.  She then was switched to Bactrim and then ciprofloxacin when her symptoms did not improve.  However, her urine culture did grow pansensitive E. coli.  States that she is continue to have dysuria.  She has a few days left of ciprofloxacin.  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  [facility] and [facility] for evaluation.  States that she is also feeling generally unwell and had a fever last night.  Denies cough, congestion, chest pain, shortness of breath, headaches, nausea, vomiting, diarrhea.  No vaginal discharge.  The history is provided by the patient. No language interpreter was used.  Urinary Tract Infection      Home Medications Prior to Admission medications   Medication Sig Start Date End Date Taking? Authorizing Provider  buPROPion (WELLBUTRIN XL) 300 MG 24 hr tablet TAKE 1 TABLET BY MOUTH EVERY DAY 12/19/22   Cottle, Steva Ready., MD  Cholecalciferol (VITAMIN D3) 50 MCG (2000 UT) TABS Take 2,000 Units by mouth daily with breakfast.    [provider]  clonazePAM (KLONOPIN) 1 MG tablet TAKE 1 TO 1.5 TABLETS AT BEDTIME FOR SLEEP 02/18/23   Cottle, Steva Ready., MD  Cyanocobalamin ER (B-12 DUAL SPECTRUM) 5000 MCG TBCR Take 1 tablet by mouth daily with breakfast.    [provider]  lisdexamfetamine (VYVANSE) 70 MG capsule Take 1 capsule (70 mg total) by mouth daily. 02/16/23   Cottle, Steva Ready., MD  lithium carbonate 150 MG capsule Take 3  capsules (450 mg total) by mouth every evening. 12/08/22   Cottle, Steva Ready., MD  pantoprazole (PROTONIX) 40 MG tablet Take 40 mg by mouth daily. 07/10/20   [provider]  VRAYLAR 1.5 MG capsule TAKE 1 CAPSULE BY MOUTH EVERY DAY Patient taking differently: Take 1.5 mg by mouth once a week. 09/16/22   Cottle, Steva Ready., MD      Allergies    Patient has no known allergies.    Review of Systems   Review of Systems  Genitourinary:  Positive for dysuria.  All other systems reviewed and are negative.   Physical Exam Updated Vital Signs BP 136/83   Pulse 67   Temp 98.2 F (36.8 C)   Resp 16   LMP 05/20/2010   SpO2 100%  Physical Exam Vitals and nursing note reviewed.  Constitutional:      General: She is not in acute distress.    Appearance: Normal appearance. She is normal weight. She is not ill-appearing, toxic-appearing or diaphoretic.  HENT:     Head: Normocephalic and atraumatic.  Cardiovascular:     Rate and Rhythm: Normal rate and regular rhythm.     Heart sounds: Normal heart sounds.  Pulmonary:     Effort: Pulmonary effort is normal. No respiratory distress.     Breath sounds: Normal breath sounds.  Abdominal:     General: Abdomen is flat.     Palpations: Abdomen  is soft.     Tenderness: There is no abdominal tenderness.  Musculoskeletal:        General: Normal range of motion.     Cervical back: Normal range of motion.  Skin:    General: Skin is warm and dry.  Neurological:     General: No focal deficit present.     Mental Status: She is alert.  Psychiatric:        Mood and Affect: Mood normal.        Behavior: Behavior normal.     ED Results / Procedures / Treatments   Labs (all labs ordered are listed, but only abnormal results are displayed) Labs Reviewed  URINE CULTURE  RESP PANEL BY RT-PCR (RSV, FLU A&B, COVID)  [id number]  URINALYSIS, ROUTINE W REFLEX MICROSCOPIC    EKG None  Radiology No results found.  Procedures Procedures     Medications Ordered in ED Medications - No data to display  ED Course/ Medical Decision Making/ A&P                                 Medical Decision Making Amount and/or Complexity of Data Reviewed Labs: ordered.   Patient presents today with concern for resistant UTI.  She is afebrile, nontoxic-appearing, and in no acute distress with reassuring vital signs.  Physical exam reveals abdomen soft and nontender.  Her UA is noninfectious today.  Culture sent given her history of resistance.  She is not febrile today.  When informed of her negative urine sample, she did request a COVID and flu swab.  She is having no respiratory symptoms.  Same has been obtained, no indication for her to stay for these results.  Did consider further evaluation with labs and imaging, however patient states her symptoms are mild and she would prefer to go home and wait for her culture report to come back and return if any new or worsening symptoms which is reasonable. Evaluation and diagnostic testing in the emergency department does not suggest an emergent condition requiring admission or immediate intervention beyond what has been performed at this time.  Plan for discharge with close PCP follow-up.  Patient is understanding and amenable with plan, educated on red flag symptoms that would prompt immediate return.  Patient discharged in stable condition.  Final Clinical Impression(s) / ED Diagnoses Final diagnoses:  Dysuria    Rx / DC Orders ED Discharge Orders     None     An After Visit Summary was printed and given to the patient.     Vear Clock 02/27/23 1112    Alvira Monday, MD 02/27/23 [id number]

## 2023-02-27 NOTE — Discharge Instructions (Signed)
As we discussed, your workup in the ER today was reassuring for acute findings.  Your urine specimen did not show any signs of an infection.  We have sent a culture and it will result in a few days.  If anything grows, you may receive a phone call from a pharmacist discussing additional measures to manage her symptoms.  Additionally, I you requested to be swabbed for COVID, flu, and RSV.  These are pending at your discharge.  Please monitor your MyChart for these results.  Follow-up with your primary care doctor in the next few days.  Return if development of any new or worsening symptoms.

## 2023-02-28 LAB — URINE CULTURE: Culture: NO GROWTH

## 2023-03-23 ENCOUNTER — Other Ambulatory Visit: Payer: Self-pay | Admitting: Psychiatry

## 2023-03-23 ENCOUNTER — Other Ambulatory Visit (HOSPITAL_BASED_OUTPATIENT_CLINIC_OR_DEPARTMENT_OTHER): Payer: Self-pay

## 2023-03-23 MED ORDER — LISDEXAMFETAMINE DIMESYLATE 70 MG PO CAPS
70.0000 mg | ORAL_CAPSULE | Freq: Every day | ORAL | 0 refills | Status: DC
  Filled 2023-03-23: qty 30, 30d supply, fill #0

## 2023-03-23 NOTE — Telephone Encounter (Signed)
LF 11/13; LV 09/4 NV 1/7

## 2023-03-24 ENCOUNTER — Other Ambulatory Visit: Payer: Self-pay | Admitting: Psychiatry

## 2023-03-24 DIAGNOSIS — F339 Major depressive disorder, recurrent, unspecified: Secondary | ICD-10-CM

## 2023-04-12 ENCOUNTER — Encounter: Payer: Self-pay | Admitting: Psychiatry

## 2023-04-12 ENCOUNTER — Ambulatory Visit: Payer: 59 | Admitting: Psychiatry

## 2023-04-12 DIAGNOSIS — F900 Attention-deficit hyperactivity disorder, predominantly inattentive type: Secondary | ICD-10-CM

## 2023-04-12 DIAGNOSIS — F339 Major depressive disorder, recurrent, unspecified: Secondary | ICD-10-CM

## 2023-04-12 DIAGNOSIS — F5105 Insomnia due to other mental disorder: Secondary | ICD-10-CM

## 2023-04-12 DIAGNOSIS — F411 Generalized anxiety disorder: Secondary | ICD-10-CM

## 2023-04-12 DIAGNOSIS — R7989 Other specified abnormal findings of blood chemistry: Secondary | ICD-10-CM

## 2023-04-12 MED ORDER — BUPROPION HCL ER (XL) 300 MG PO TB24: 300.0000 mg | ORAL_TABLET | Freq: Every day | ORAL | 1 refills | Status: DC

## 2023-04-12 MED ORDER — CLONAZEPAM 1 MG PO TABS
ORAL_TABLET | ORAL | 2 refills | Status: DC
Start: 2023-04-12 — End: 2023-08-26

## 2023-04-12 MED ORDER — LITHIUM CARBONATE 150 MG PO CAPS
450.0000 mg | ORAL_CAPSULE | Freq: Every evening | ORAL | 1 refills | Status: DC
Start: 2023-04-12 — End: 2023-11-29

## 2023-04-12 NOTE — Progress Notes (Signed)
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 her weight is stable.  Sleep bettter at present.  Vyvanse  70 mg AM but she might hold for awhile DT high deductible.  Energy is better and no depression.  B12 seemed to help.   Option Aricept DT memory concerns.  She doesn't want more meds.  Her SI is gone after increasing lithium  to 450 mg for SI and depression.  Depression resolved.  Ok after stopping Vraylar . Disc option Auvelity  if dep recurs.  We discussed the short-term risks associated with benzodiazepines including sedation and increased fall risk among others.  Discussed long-term side effect risk including dependence, potential withdrawal symptoms, and the potential eventual dose-related risk of dementia.  But recent studies from 2020 dispute this association between benzodiazepines and dementia risk. Newer studies in 2020 do not support an association with dementia. Ok on clonazepam  1.5 mg HS with sleep.  Counseled patient regarding potential benefits, risks, and side effects of lithium  to include potential risk of lithium  affecting thyroid  and renal function.  Discussed need for periodic lab monitoring to determine drug  level and to assess for potential adverse effects.  Counseled patient regarding signs and symptoms of lithium  toxicity and advised that they notify office immediately or seek urgent medical attention if experiencing these signs and symptoms.  Patient advised to contact office with any questions or concerns.  Disc lithium  and toxicity. lithium  to morning to eliminate nocturia. Continue lithium  450 mg HS resolved recurrent SI  She forgot to make the switch after the last visit.  Vitamin D  5000 units [name]  FU 4-6 mos  Lorene Macintosh, MD, DFAPA  Please see After Visit Summary for patient specific instructions.   Future Appointments  Date Time Provider Department Center  07/11/2023 10:40 AM [facility] 1 [facility]  07/11/2023 10:50 AM [facility] US  1 [facility]     No orders of the defined types were placed in this encounter.      Lorene Macintosh, MD, DFAPA   -------------------------------

## 2023-05-23 ENCOUNTER — Ambulatory Visit (INDEPENDENT_AMBULATORY_CARE_PROVIDER_SITE_OTHER): Payer: Managed Care, Other (non HMO) | Admitting: Podiatry

## 2023-05-23 ENCOUNTER — Ambulatory Visit (INDEPENDENT_AMBULATORY_CARE_PROVIDER_SITE_OTHER): Payer: Managed Care, Other (non HMO)

## 2023-05-23 ENCOUNTER — Other Ambulatory Visit: Payer: Self-pay | Admitting: Podiatry

## 2023-05-23 DIAGNOSIS — L84 Corns and callosities: Secondary | ICD-10-CM | POA: Diagnosis not present

## 2023-05-23 DIAGNOSIS — M21619 Bunion of unspecified foot: Secondary | ICD-10-CM | POA: Diagnosis not present

## 2023-05-23 DIAGNOSIS — M2011 Hallux valgus (acquired), right foot: Secondary | ICD-10-CM

## 2023-05-23 DIAGNOSIS — M21611 Bunion of right foot: Secondary | ICD-10-CM

## 2023-05-23 NOTE — Progress Notes (Unsigned)
Chief Complaint  Patient presents with   Bunions    Right foot bunion  Pt stated that its not the bunion that is causing her pain its the place in between her 2nd toe that is causing the discomfort    HPI: 58 y.o. female presents today for bunion evaluation.  Notes her bunion has been slowly, but progressively, worsening and pain on the right foot.  She has a painful corn on the right 2nd toe from the hallux pushing/rubbing against it.  Patient also wanted to hear about surgical options today.  She is wondering if she is a candidate for the Lapiplasty procedure. She does not have any joint pain in the midfoot.  Past Medical History:  Diagnosis Date   Abdominal pain    Anal itching    Anxiety    Breast inflammation    Breast nodule    left   Chronic kidney disease    Cystocele    Decreased libido    Depression    Diverticular disease    Eczema    Fibroadenoma    Galactorrhea    IBS (irritable bowel syndrome)    Mastodynia    Pelvic pain in female    Pelvic relaxation    Rectocele    Seizure-like activity (HCC)    Serotonin syndrome     Past Surgical History:  Procedure Laterality Date   ABDOMINAL HYSTERECTOMY     partial   BREAST BIOPSY     HERNIA REPAIR     INCONTINENCE SURGERY     OVARIAN CYST REMOVAL     left   No Known Allergies   PHYSICAL EXAM:  General: The patient is alert and oriented x3 in no acute distress.  Dermatology: Skin is warm, dry and supple bilateral lower extremities. Interspaces are clear of maceration and debris.  No rashes noted.  There is a focal hyperkeratotic lesion on the medial aspect of the second toe PIPJ and minimal hyperkeratotic buildup on the lateral aspect of the hallux IPJ.  Vascular: Palpable pedal pulses bilaterally. Capillary refill within normal limits.  No appreciable edema.  No erythema or calor.  Neurological: Light touch sensation grossly intact bilateral feet.   Musculoskeletal Exam:  There is a bony medial  prominence on the dorsomedial aspect of the 1st metatarsal head of the right foot.  There is pain on palpation of the "bump" in this area.  Lateral deviation of the hallux at the MPJ level.  1st MPJ ROM is decreased.  No crepitus.  Hallux is tracking, not trackbound.  No pain on palpation at the first metatarsal-medial cuneiform joint.  No significant hypermobility of the first ray  RADIOGRAPHIC EXAM:  Increased first intermetatarsal angle 13.9 degrees.  Increased hallux abductus angle.  Enlargement of bone at dorsomedial 1st metatarsal head.  Joint space is preserved.  Tibial sesamoid position is 4  ASSESSMENT/PLAN OF CARE: 1. Bunion   2. Hallux abductovalgus, acquired, right   3. Corn of toe      DG FOOT COMPLETE RIGHT  Discussed patient's condition and possible etiologies today.  Discussed conservative treatment options with patient today, including shoe modification / arch supports, off-loading, cortisone injectione, NSAID topical / oral therapy, and toe splints and shields.  Briefly discussed surgical intervention if conservative options are not successful.  Discussed risks involved with proceeding with outpatient surgical intervention for the bunion as well.  Informed patient I do not feel that the Lapa plasty procedure is the best procedure  for her at this time due to the fusion that is part of the procedure at the first metatarsal-medial cuneiform joint.  I feel she would be best served with either an Austin/Akin procedure or a minimal incision bunion correction from one of our other surgeons who perform MIS surgery.  These also would have less downtime/nonweightbearing requirements then the Lapiplasty procedure.  As she notes that she has a busy year ahead and would not be able to have anything performed until the latter half of 2025.  The corn in the right first interspace was shaved with a sterile #313 blade uneventfully.  She was fitted for a double loop gel toe spacer and a single loop  gel toe spacer.  She can wear this during waking hours when in shoe gear to decrease irritation to the toes.  Follow-up as needed   Clerance Lav, DPM, FACFAS [facility]     [address] [place], [place] [address]                Office [phone]  Fax [phone]

## 2023-05-24 DIAGNOSIS — M2011 Hallux valgus (acquired), right foot: Secondary | ICD-10-CM | POA: Insufficient documentation

## 2023-05-25 ENCOUNTER — Encounter: Payer: Self-pay | Admitting: Podiatry

## 2023-05-26 ENCOUNTER — Encounter: Payer: Self-pay | Admitting: Psychiatry

## 2023-05-26 ENCOUNTER — Ambulatory Visit: Payer: 59 | Admitting: Psychiatry

## 2023-05-26 DIAGNOSIS — F411 Generalized anxiety disorder: Secondary | ICD-10-CM | POA: Diagnosis not present

## 2023-05-26 DIAGNOSIS — R7989 Other specified abnormal findings of blood chemistry: Secondary | ICD-10-CM

## 2023-05-26 DIAGNOSIS — F5105 Insomnia due to other mental disorder: Secondary | ICD-10-CM

## 2023-05-26 DIAGNOSIS — F339 Major depressive disorder, recurrent, unspecified: Secondary | ICD-10-CM

## 2023-05-26 DIAGNOSIS — F900 Attention-deficit hyperactivity disorder, predominantly inattentive type: Secondary | ICD-10-CM

## 2023-05-26 MED ORDER — AUVELITY 45-105 MG PO TBCR: 1.0000 | EXTENDED_RELEASE_TABLET | Freq: Two times a day (BID) | ORAL | Status: DC

## 2023-05-26 NOTE — Progress Notes (Signed)
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 multiple failed medications noted above.  She failed the attempt to wean Zyprexa because of worsening anxiety.  Until switch to Leafy Kindle has been ok.    Wellbutrin reduced to 300 bc GTC sz of unknown origin.  Was better with 450 mg but OK so far with reduction. SZ WU normal   Dep relapse.  She doesn't want to resume Vraylar.  Defer per her request NAC 600 mg or more daily.  She feels her protein intake is adequate and her weight is stable.  Sleep bettter at present.  Hold DT cost Vyvanse 70 mg AM but she might hold for awhile DT high deductible.  Energy is better and no depression.  B12 seemed to help.   Option Aricept DT memory concerns.  She doesn't want more meds.  Her SI is gone after increasing lithium to 450 mg for SI and depression.  Depression resolved.  Ok after stopping Wellsite geologist, until now Disc option Auvelity if dep recurs. Disc SE Start Auvelity 1 in the AM 4 days then 1 twice daily DC Wellbutrin.    We discussed the short-term  risks associated with benzodiazepines including sedation and increased fall risk among others.  Discussed long-term side effect risk including dependence, potential withdrawal symptoms, and the potential eventual dose-related risk of dementia.  But recent studies from 2020 dispute this association between benzodiazepines and dementia risk. Newer studies in 2020 do not support an association with dementia. Ok on clonazepam 1.5 mg HS with sleep.  Counseled patient regarding potential benefits, risks, and side effects of lithium to include potential risk of lithium affecting thyroid and renal function.  Discussed need for periodic lab monitoring to determine drug level and to assess for potential adverse effects.  Counseled patient regarding signs and symptoms of lithium toxicity and advised that they notify office immediately or seek urgent medical attention if experiencing these signs and symptoms.  Patient advised to contact office with any questions or concerns.  Disc lithium and toxicity. lithium to morning to eliminate nocturia. Continue lithium 450 mg HS resolved recurrent SI  She forgot to make  the switch after the last visit.  Vitamin [name] 5000 units daily  FU 4-6 mos  Meredith Staggers, MD, DFAPA  Please see After Visit Summary for patient specific instructions.   Future Appointments  Date Time Provider Department Center  07/11/2023 10:40 AM [facility] [place] 1 [facility]  07/11/2023 10:50 AM [facility] [place] 1 [facility]  09/13/2023  1:00 PM Cottle, Steva Ready., MD [facility] None     No orders of the defined types were placed in this encounter.      Meredith Staggers, MD, DFAPA   -------------------------------

## 2023-06-03 ENCOUNTER — Telehealth: Payer: Self-pay | Admitting: Psychiatry

## 2023-06-03 NOTE — Telephone Encounter (Signed)
 Pls get her old chart for me.  I need to check prior dosage of pramipexole and it's effect

## 2023-06-03 NOTE — Telephone Encounter (Signed)
 I would like her to try Auvelity longer.  She may be tired from the reduction in Wellbutrin dose.  We could add Wellbutrin XL 150 mg AM back to the Auvelity to see if that resolves the tiredness.  This is my preference bc it's not been long enough on Auvelity to see a good antidepressant effect.    The other option is to return to Wellbutrin XL 300, stop Auvelity and return to Vraylar 1.5 mg 3 times weekly like she took before.  Other options exist but I'd have to see her in an appt to discuss them.

## 2023-06-03 NOTE — Telephone Encounter (Signed)
 Pt called at 2:35p stating that she started taking the Auvelity around 2/21.  She said she feels worse than before.  She said she is super tired, sleeping a lot and just doesn't feel well.    Next appt 4/30

## 2023-06-03 NOTE — Telephone Encounter (Signed)
 Apt last Thursday and taken off Wellbutrin and started Auvelity. Feeling really tired since the switch.

## 2023-06-08 MED ORDER — BUPROPION HCL ER (XL) 150 MG PO TB24: 150.0000 mg | ORAL_TABLET | Freq: Every day | ORAL | 1 refills | Status: DC

## 2023-06-08 NOTE — Telephone Encounter (Signed)
 Patient provided the options. She agrees that she hasn't been on the Auvelity long enough. She opts to add 150 mg XL in the morning.

## 2023-06-08 NOTE — Addendum Note (Signed)
 Addended by: Karin Lieu T on: 06/08/2023 03:52 PM   Modules accepted: Orders

## 2023-06-08 NOTE — Telephone Encounter (Signed)
 Sent Rx for 150 XL.

## 2023-06-13 ENCOUNTER — Other Ambulatory Visit: Payer: Self-pay | Admitting: Psychiatry

## 2023-06-13 ENCOUNTER — Other Ambulatory Visit (HOSPITAL_BASED_OUTPATIENT_CLINIC_OR_DEPARTMENT_OTHER): Payer: Self-pay

## 2023-06-13 MED ORDER — LISDEXAMFETAMINE DIMESYLATE 70 MG PO CAPS
70.0000 mg | ORAL_CAPSULE | Freq: Every day | ORAL | 0 refills | Status: DC
  Filled 2023-06-13: qty 30, 30d supply, fill #0

## 2023-06-14 ENCOUNTER — Other Ambulatory Visit (HOSPITAL_BASED_OUTPATIENT_CLINIC_OR_DEPARTMENT_OTHER): Payer: Self-pay

## 2023-06-24 ENCOUNTER — Ambulatory Visit (INDEPENDENT_AMBULATORY_CARE_PROVIDER_SITE_OTHER): Admitting: Podiatry

## 2023-06-24 ENCOUNTER — Encounter: Payer: Self-pay | Admitting: Podiatry

## 2023-06-24 DIAGNOSIS — M2011 Hallux valgus (acquired), right foot: Secondary | ICD-10-CM | POA: Diagnosis not present

## 2023-06-24 DIAGNOSIS — L84 Corns and callosities: Secondary | ICD-10-CM | POA: Diagnosis not present

## 2023-06-24 DIAGNOSIS — M21619 Bunion of unspecified foot: Secondary | ICD-10-CM

## 2023-06-24 NOTE — Patient Instructions (Signed)
 More silicone pads can be purchased from:  [email or web address]

## 2023-06-24 NOTE — Progress Notes (Signed)
 Chief Complaint  Patient presents with   Callouses    Patient states that she came here and got a Callouse cut off but it came back and is still sore, on right foot between hallux and 2nd toe. Pain level is between 6 and 7 on scale 1-10    HPI: 58 y.o. female presents today with recurrent callus to right second toe associated with bunion deformity.  She previously saw Dr. Burna Mortimer for this about one month ago and has had recurrence of the lesion.  She has not had a lot of success using some of the toe spacers.  Last visit they did discuss surgery some, patient wishing to avoid surgery at this time.  Past Medical History:  Diagnosis Date   Abdominal pain    Anal itching    Anxiety    Breast inflammation    Breast nodule    left   Chronic kidney disease    Cystocele    Decreased libido    Depression    Diverticular disease    Eczema    Fibroadenoma    Galactorrhea    IBS (irritable bowel syndrome)    Mastodynia    Pelvic pain in female    Pelvic relaxation    Rectocele    Seizure-like activity (HCC)    Serotonin syndrome     Past Surgical History:  Procedure Laterality Date   ABDOMINAL HYSTERECTOMY     partial   BREAST BIOPSY     HERNIA REPAIR     INCONTINENCE SURGERY     OVARIAN CYST REMOVAL     left    No Known Allergies  ROS    Physical Exam: There were no vitals filed for this visit.  General: The patient is alert and oriented x3 in no acute distress.  Dermatology: Skin is warm, dry and supple bilateral lower extremities. Interspaces are clear of maceration and debris.  Hyperkeratotic lesion present medial aspect of right second PIPJ without underlying ulceration.  There is corresponding lesion with minimal buildup on the left hallux IPJ.  Vascular: Palpable pedal pulses bilaterally. Capillary refill within normal limits.  No appreciable edema.  No erythema or calor.  Neurological: Light touch sensation grossly intact bilateral feet.    Musculoskeletal Exam: There is a bony medial prominence on the dorsomedial aspect of the 1st metatarsal head of the right foot.  There is pain on palpation of the "bump" in this area.  Lateral deviation of the hallux at the MPJ level.  1st MPJ ROM is decreased.  No crepitus.  Hallux is tracking, not trackbound.  No pain on palpation at the first metatarsal-medial cuneiform joint.  No significant hypermobility of the first ray    Assessment/Plan of Care: 1. Hallux abductovalgus, acquired, right   2. Bunion   3. Corn of toe      No orders of the defined types were placed in this encounter.  None  Discussed clinical findings with patient today.  Right second toe corn was palliatively debrided today using a #15 blade to patient tolerance without incident.  Salinocaine and Band-Aid applied.  Discussed home management strategies including offloading of the lesion, use of creams or lotions to soften the lesion, recommended scrubbing the lesions with white vinegar to help keep this area soft.  Gel toe cap and corn pad dispensed today.  Did briefly discuss surgical treatment with the patient. She states she is not ready for this at this time and will see  if she can manage the callus. She is asking particularly about MIS surgery and what this recovery course would look like. She will consider this going forward.   Khyan Oats L. Marchia Bond, AACFAS [facility]     [address] [place], [place] [address]                Office [phone]  Fax [phone]

## 2023-06-27 ENCOUNTER — Encounter: Payer: Self-pay | Admitting: Podiatry

## 2023-07-01 ENCOUNTER — Other Ambulatory Visit: Payer: Self-pay | Admitting: Psychiatry

## 2023-07-04 ENCOUNTER — Other Ambulatory Visit: Payer: Self-pay | Admitting: Psychiatry

## 2023-07-11 ENCOUNTER — Ambulatory Visit
Admission: RE | Admit: 2023-07-11 | Discharge: 2023-07-11 | Disposition: A | Discharge status: Home / Self Care | Payer: 59 | Source: Ambulatory Visit | Attending: Obstetrics and Gynecology | Admitting: Obstetrics and Gynecology

## 2023-07-11 DIAGNOSIS — N6320 Unspecified lump in the left breast, unspecified quadrant: Secondary | ICD-10-CM

## 2023-07-22 ENCOUNTER — Other Ambulatory Visit: Payer: Self-pay | Admitting: Psychiatry

## 2023-07-22 ENCOUNTER — Other Ambulatory Visit (HOSPITAL_BASED_OUTPATIENT_CLINIC_OR_DEPARTMENT_OTHER): Payer: Self-pay

## 2023-07-25 MED ORDER — LISDEXAMFETAMINE DIMESYLATE 70 MG PO CAPS
70.0000 mg | ORAL_CAPSULE | Freq: Every day | ORAL | 0 refills | Status: DC
  Filled 2023-07-25: qty 30, 30d supply, fill #0

## 2023-07-26 ENCOUNTER — Other Ambulatory Visit (HOSPITAL_BASED_OUTPATIENT_CLINIC_OR_DEPARTMENT_OTHER): Payer: Self-pay

## 2023-08-03 ENCOUNTER — Ambulatory Visit (INDEPENDENT_AMBULATORY_CARE_PROVIDER_SITE_OTHER): Payer: 59 | Admitting: Psychiatry

## 2023-08-03 ENCOUNTER — Encounter: Payer: Self-pay | Admitting: Psychiatry

## 2023-08-03 DIAGNOSIS — F5105 Insomnia due to other mental disorder: Secondary | ICD-10-CM

## 2023-08-03 DIAGNOSIS — F900 Attention-deficit hyperactivity disorder, predominantly inattentive type: Secondary | ICD-10-CM | POA: Diagnosis not present

## 2023-08-03 DIAGNOSIS — F339 Major depressive disorder, recurrent, unspecified: Secondary | ICD-10-CM | POA: Diagnosis not present

## 2023-08-03 DIAGNOSIS — F411 Generalized anxiety disorder: Secondary | ICD-10-CM | POA: Diagnosis not present

## 2023-08-03 NOTE — Progress Notes (Addendum)
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 normal.        Judgment: Judgment normal.     Comments: Insight intact SI resolved.      Lab Review:     Component Value Date/Time   NA 140 03/01/2020 1731   K 3.8 03/01/2020 1731   CL 102 03/01/2020 1731   CO2 25 03/01/2020 1731   GLUCOSE 92 03/01/2020 1731   BUN 9 03/01/2020 1731   CREATININE 1.17 (H) 03/01/2020 1731   CALCIUM 10.0 03/01/2020 1731   PROT 6.8 03/01/2020 1731   ALBUMIN 4.5 03/01/2020 1731   AST 28 03/01/2020 1731   ALT 23 01/21/2021 0957   ALKPHOS 68 03/01/2020 1731   BILITOT 0.8 03/01/2020 1731   GFRNONAA 55 (L) 03/01/2020 1731   GFRAA >60 10/10/2015 1115       Component Value Date/Time   WBC 7.3 03/01/2020 1731   RBC 5.12 (H) 03/01/2020 1731   HGB 16.5 (H) 03/01/2020 1731   HCT 50.9 (H) 03/01/2020 1731   PLT 211 03/01/2020 1731   MCV 99.4 03/01/2020 1731   MCH 32.2 03/01/2020 1731   MCHC 32.4 03/01/2020 1731   RDW 11.9 03/01/2020 1731   LYMPHSABS 1.2 03/01/2020 1731   MONOABS 0.4 03/01/2020 1731   EOSABS 0.1 03/01/2020 1731   BASOSABS 0.1 03/01/2020 1731    Lithium  Lvl  Date Value Ref Range Status  03/01/2020 0.41 (L) 0.60 - 1.20 mmol/L Final    Comment:    Performed at [facility], [address], [place], [place] [address]     No results found for: "PHENYTOIN", "PHENOBARB", "VALPROATE", "CBMZ"   Lithium  level received April 27, 2018 was 0.7.  This is probably adequate to help reduce the risk of relapse of depression post TMS.  Therefore no med changes.    Vitamin D  level April 27, 2018 on supplement was 60 and adequate. .res Assessment: Plan:    Shellene was seen today for follow-up, depression, anxiety, sleeping problem and add.  Diagnoses and all orders for this visit:  Recurrent major depression resistant to treatment [facility])  Generalized anxiety disorder  Attention deficit hyperactivity disorder (ADHD), predominantly inattentive type  Insomnia due to mental condition  We discussed her severe TRD hx and other dxes.  Reviewed multiple failed medications noted above.   Has been more depressed   since off th e Vraylar  markedly.  Agrees to resume it.  Wellbutrin  reduced to 300 bc GTC sz of unknown origin.  Was better with 450 mg but OK so far with reduction. SZ WU normal   Defer per her request NAC 600 mg or more daily.  She feels her protein intake is adequate and her weight is stable.  Sleep bettter at present.  Hold DT cost Vyvanse  70 mg AM but she might hold for awhile DT high deductible. Consider resuming for dep  Option Aricept DT memory concerns.  She doesn't want more meds.  Her SI is gone after increasing lithium  to 450 mg for SI and depression.   Return to Vraylar  1.5 mg MWF.  We discussed the short-term risks associated with benzodiazepines including sedation and increased fall risk among others.  Discussed long-term side effect risk including dependence, potential withdrawal symptoms, and the potential eventual dose-related risk of dementia.  But recent studies from 2020 dispute this association between benzodiazepines and dementia risk. Newer studies in 2020 do not support an association with dementia. Ok on clonazepam  1.5 mg HS with  sleep to 2 mg .  Counseled patient regarding potential benefits, risks, and side effects of lithium  to include potential risk of lithium  affecting thyroid  and renal function.  Discussed need for periodic lab monitoring to determine drug level and to assess for potential adverse effects.  Counseled patient regarding signs and symptoms of lithium  toxicity and advised that they notify office immediately or seek urgent medical attention if experiencing these signs and symptoms.  Patient advised to contact office with any questions or concerns.  Disc lithium  and toxicity. lithium  to morning to eliminate nocturia. Continue lithium  450 mg  resolved recurrent SI  She forgot to make the switch in lithium  timing after the last visit.  Vitamin D  5000 units daily  FU2 mos  Nori Beat, MD, DFAPA  Please see After Visit Summary for patient specific instructions.   Future Appointments  Date Time Provider Department Center  09/13/2023  1:00 PM Cottle, Kennedy Peabody., MD [facility] None     No orders of the defined types were placed in this encounter.      Nori Beat, MD, DFAPA   -------------------------------

## 2023-08-15 ENCOUNTER — Telehealth: Payer: Self-pay

## 2023-08-15 NOTE — Telephone Encounter (Signed)
 MyChart message  Good morning Dr. Toi Foster, Vraylar  is going well. My husband has noticed a difference, that's good. I have had another of those episodes where I can't write very well, I almost feel drunk. Do you think it could be Tata dyskinesia? Something is not right?

## 2023-08-15 NOTE — Telephone Encounter (Signed)
 Pls call her for me. I assume these sx are new since restarting Vraylar , right?  No it is not TD.  It does not come on quickly and doesn't cause those sort of sx..   Is the drunk feeling and difficulty writing persistent or come and go ?   Is her vision affected like with migraines?  What makes it difficult to write; vision or coordination?   What is the "drunk feeling"?  Sleepiness or cognitive ?   If she thinks this is a SE then reduce Vraylar  to every other day or twice weekly.  She took it before and tolerated it.

## 2023-08-18 NOTE — Telephone Encounter (Signed)
 Spoke with pt about her symptoms, she reports they come and go and she does not feel like they are coming from Vraylar  she is just trying to figure out where. She reports the "drunk feeling" is feeling just "out of it", not sleepy.  She reports her vision is not affected. She doesn't feel unsteady ambulating or dizzy, no lightheadedness but can tell her gait changes some. She reports the Vraylar  is helping her mood and she is feeling better taking it. Advised her if she wants to try taking every other day to see if that has any effect she can and to notify us  if symptoms worsen. She agreed to try reducing some to see if that helps. Informed her I would update Dr. Toi Foster.

## 2023-08-22 ENCOUNTER — Telehealth: Payer: Self-pay | Admitting: Psychiatry

## 2023-08-22 NOTE — Telephone Encounter (Signed)
 Next appt is 09/13/23. Requesting a refill on Adderall at [facility], [address], [place], [place]   Phone: [phone]  Fax: [phone]    It is in stock.

## 2023-08-22 NOTE — Telephone Encounter (Signed)
 Message said patient is asking for Adderall.  She filled Vyvanse  70 mg on 4/22. I asked if she was wanting to switch to Adderall and she said she thought they were the same medication. She said the Vyvanse  isn't working, doesn't help her focus and doesn't last long enough. She said she has issues with medications, that they work for a while and then they don't work. She has previously tried methylphenidate  ER 54 mg in July 2024 for one RF. She was previously on Adderall ER 30 for a few months. She was getting phentermine  for a few months and not getting a stimulant.   She was last seen 4/30.  Hold DT cost Vyvanse  70 mg AM but she might hold for awhile DT high deductible. Consider resuming for dep.   FU 6/10.

## 2023-08-23 ENCOUNTER — Other Ambulatory Visit: Payer: Self-pay | Admitting: Psychiatry

## 2023-08-23 ENCOUNTER — Other Ambulatory Visit (HOSPITAL_BASED_OUTPATIENT_CLINIC_OR_DEPARTMENT_OTHER): Payer: Self-pay

## 2023-08-23 MED ORDER — AMPHETAMINE-DEXTROAMPHETAMINE 30 MG PO TABS
30.0000 mg | ORAL_TABLET | Freq: Two times a day (BID) | ORAL | 0 refills | Status: DC
Start: 2023-08-23 — End: 2023-09-30

## 2023-08-23 NOTE — Telephone Encounter (Signed)
 She took Adderall XR 30 mg last year and said it didn't do anything.  There is no higher dose dose of Adderall XR.  I willsend in Adderall 30 mg BID.  Each dose will only last 4 hours.  That is the highest dose I can RX

## 2023-08-24 ENCOUNTER — Other Ambulatory Visit: Payer: Self-pay | Admitting: Psychiatry

## 2023-08-24 DIAGNOSIS — F5105 Insomnia due to other mental disorder: Secondary | ICD-10-CM

## 2023-08-24 NOTE — Telephone Encounter (Signed)
Due 4/26

## 2023-08-24 NOTE — Telephone Encounter (Signed)
 LVM to [facility]

## 2023-08-24 NOTE — Telephone Encounter (Signed)
 Called patient back and reviewed with her recommendations. She had picked up Rx today.

## 2023-09-13 ENCOUNTER — Ambulatory Visit: Payer: 59 | Admitting: Psychiatry

## 2023-09-26 ENCOUNTER — Telehealth: Payer: Self-pay | Admitting: Psychiatry

## 2023-09-26 MED ORDER — CARIPRAZINE HCL 1.5 MG PO CAPS: 1.5000 mg | ORAL_CAPSULE | Freq: Every day | ORAL | 0 refills | Status: DC

## 2023-09-26 NOTE — Telephone Encounter (Signed)
 Pt lvm 10:00 requesting Rx for Vraylar . Has taken samples.  Need Rx Adderall also.[facility] [address]   [address]

## 2023-09-26 NOTE — Telephone Encounter (Signed)
 Sent Rx for Vraylar  1.5. Sent #30 but note says she takes M-W-F.

## 2023-09-29 ENCOUNTER — Telehealth: Payer: Self-pay

## 2023-09-29 NOTE — Telephone Encounter (Signed)
 Prior authorization submitted and approved for Vraylar  1.5 mg #30/30 day with [employer] 09/29/23-04/04/2098, PA# [id number]

## 2023-09-30 ENCOUNTER — Telehealth: Payer: Self-pay | Admitting: Psychiatry

## 2023-09-30 ENCOUNTER — Other Ambulatory Visit: Payer: Self-pay

## 2023-09-30 MED ORDER — AMPHETAMINE-DEXTROAMPHETAMINE 30 MG PO TABS: 30.0000 mg | ORAL_TABLET | Freq: Two times a day (BID) | ORAL | 0 refills | Status: DC

## 2023-09-30 NOTE — Telephone Encounter (Signed)
 Pt called today checking on the status of her adderall 30 mg script.  Please send to the pharmacy

## 2023-09-30 NOTE — Telephone Encounter (Signed)
 Pended Adderall 30 mg, #60, to [facility] [address]

## 2023-10-11 ENCOUNTER — Other Ambulatory Visit: Payer: Self-pay

## 2023-10-11 ENCOUNTER — Telehealth: Payer: Self-pay | Admitting: Psychiatry

## 2023-10-11 MED ORDER — LISDEXAMFETAMINE DIMESYLATE 70 MG PO CAPS: 70.0000 mg | ORAL_CAPSULE | Freq: Every day | ORAL | 0 refills | Status: DC

## 2023-10-11 NOTE — Telephone Encounter (Signed)
 Pt cfalled at 10:03a stating that she was on Vyvanse  and switched to Adderall.  She said the Adderall is not working and she wants to go back to Vyvanse , which discussed with Dr Geoffry.  She's not sure if she can get an increase in the Vyvanse .  [facility]   [id number] [place],  - [address] [facility] [address], [place] [address] Phone: [phone]  Fax: [phone]   Next appt 7/30

## 2023-10-11 NOTE — Telephone Encounter (Signed)
Pended Vyvanse 

## 2023-10-12 ENCOUNTER — Other Ambulatory Visit (HOSPITAL_BASED_OUTPATIENT_CLINIC_OR_DEPARTMENT_OTHER): Payer: Self-pay

## 2023-10-12 ENCOUNTER — Telehealth: Payer: Self-pay | Admitting: Psychiatry

## 2023-10-12 ENCOUNTER — Other Ambulatory Visit: Payer: Self-pay

## 2023-10-12 MED ORDER — LISDEXAMFETAMINE DIMESYLATE 70 MG PO CAPS
70.0000 mg | ORAL_CAPSULE | Freq: Every day | ORAL | 0 refills | Status: DC
  Filled 2023-10-12: qty 30, 30d supply, fill #0

## 2023-10-12 NOTE — Telephone Encounter (Signed)
 Canceled [facility]  at [facility] and repended to [facility].

## 2023-10-12 NOTE — Telephone Encounter (Signed)
 Pt is to take Vraylar  M-W-F, not 3 times a day.

## 2023-10-12 NOTE — Telephone Encounter (Signed)
 Pt said [facility]  70[facility]  and req it be sent to [facility]. Also states she back on Vraylar  and needs to take it 3x daily and Dr. Geoffry only wrote script for 1x daily. That [facility] [address].

## 2023-10-13 ENCOUNTER — Other Ambulatory Visit (HOSPITAL_BASED_OUTPATIENT_CLINIC_OR_DEPARTMENT_OTHER): Payer: Self-pay

## 2023-10-28 ENCOUNTER — Telehealth: Payer: Self-pay | Admitting: Internal Medicine

## 2023-10-28 NOTE — Telephone Encounter (Signed)
 Good morning Dr. Federico  The following patient is being referred to us  for chronic constipation. She is a Copy patient and want to request a female provider. Are you willing to take over her care. Please review and advise. Thank you.

## 2023-11-02 ENCOUNTER — Encounter: Payer: Self-pay | Admitting: Psychiatry

## 2023-11-02 ENCOUNTER — Telehealth: Payer: Self-pay | Admitting: Psychiatry

## 2023-11-02 ENCOUNTER — Other Ambulatory Visit (HOSPITAL_COMMUNITY): Payer: Self-pay

## 2023-11-02 ENCOUNTER — Ambulatory Visit (INDEPENDENT_AMBULATORY_CARE_PROVIDER_SITE_OTHER): Admitting: Psychiatry

## 2023-11-02 DIAGNOSIS — F900 Attention-deficit hyperactivity disorder, predominantly inattentive type: Secondary | ICD-10-CM | POA: Diagnosis not present

## 2023-11-02 DIAGNOSIS — F411 Generalized anxiety disorder: Secondary | ICD-10-CM | POA: Diagnosis not present

## 2023-11-02 DIAGNOSIS — F5105 Insomnia due to other mental disorder: Secondary | ICD-10-CM

## 2023-11-02 DIAGNOSIS — F339 Major depressive disorder, recurrent, unspecified: Secondary | ICD-10-CM

## 2023-11-02 DIAGNOSIS — R7989 Other specified abnormal findings of blood chemistry: Secondary | ICD-10-CM

## 2023-11-02 MED ORDER — METHYLPHENIDATE HCL ER (OSM) 36 MG PO TBCR: 72.0000 mg | EXTENDED_RELEASE_TABLET | Freq: Every day | ORAL | 0 refills | Status: DC

## 2023-11-02 NOTE — Progress Notes (Signed)
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 Perception: Attention and perception normal. She does not perceive auditory or visual hallucinations.        Mood and Affect: Mood is anxious and depressed. Affect is not labile, blunt or inappropriate.        Speech: Speech normal.        Behavior: Behavior normal. Behavior is not agitated. Behavior is cooperative.        Thought Content: Thought content normal. Thought content is not paranoid or delusional. Thought content does not include homicidal or suicidal ideation. Thought content does not include suicidal plan.        Cognition and Memory: Cognition and memory normal.        Judgment: Judgment normal.     Comments: Insight intact SI resolved since increasing lithium  Less dep on Vraylar       Lab Review:     Component Value Date/Time   NA 140 03/01/2020 1731   K 3.8 03/01/2020 1731   CL 102 03/01/2020 1731   CO2 25 03/01/2020 1731   GLUCOSE 92 03/01/2020 1731   BUN 9 03/01/2020 1731   CREATININE 1.17 (H) 03/01/2020 1731   CALCIUM 10.0 03/01/2020 1731   PROT 6.8 03/01/2020 1731   ALBUMIN 4.5 03/01/2020 1731   AST 28 03/01/2020 1731   ALT 23  01/21/2021 0957   ALKPHOS 68 03/01/2020 1731   BILITOT 0.8 03/01/2020 1731   GFRNONAA 55 (L) 03/01/2020 1731   GFRAA >60 10/10/2015 1115       Component Value Date/Time   WBC 7.3 03/01/2020 1731   RBC 5.12 (H) 03/01/2020 1731   HGB 16.5 (H) 03/01/2020 1731   HCT 50.9 (H) 03/01/2020 1731   PLT 211 03/01/2020 1731   MCV 99.4 03/01/2020 1731   MCH 32.2 03/01/2020 1731   MCHC 32.4 03/01/2020 1731   RDW 11.9 03/01/2020 1731   LYMPHSABS 1.2 03/01/2020 1731   MONOABS 0.4 03/01/2020 1731   EOSABS 0.1 03/01/2020 1731   BASOSABS 0.1 03/01/2020 1731    Lithium  Lvl  Date Value Ref Range Status  03/01/2020 0.41 (L) 0.60 - 1.20 mmol/L Final    Comment:    Performed at [facility], [address] [place], [place] [address]     No results found for: PHENYTOIN, PHENOBARB, VALPROATE, CBMZ   Lithium  level received April 27, 2018 was 0.7.  This is probably adequate to help reduce the risk of relapse of depression post TMS.  Therefore no med changes.    Vitamin D  level April 27, 2018 on supplement was 60 and adequate. .res Assessment: Plan:    There are no diagnoses linked to this encounter.    We discussed her severe TRD hx and other dxes.  Reviewed multiple failed medications noted above.   Has been more depressed   since off th e Vraylar  markedly.  Agrees to resume it.  Wellbutrin  reduced to 300 bc GTC sz of unknown origin.  Was better with 450 mg but OK so far with reduction. SZ WU normal   Defer per her request NAC 600 mg or more daily.  She feels her protein intake is adequate and her weight is stable.  Sleep bettter at present.  Switch to Concerta  She wants to try Concerta  bc never took it .  Will need highest dose.  Concerta  72 MgAM  Option Aricept DT memory concerns.  She doesn't want more meds.  Her SI is gone after increasing lithium  to 450 mg for SI and depression.   Return to Vraylar  1.5 mg MWF.  We discussed the short-term risks associated  with benzodiazepines including sedation and increased fall risk among others.  Discussed long-term side effect risk including dependence, potential withdrawal symptoms, and the potential eventual dose-related risk of dementia.  But recent studies from 2020 dispute this association between benzodiazepines and dementia risk. Newer studies in 2020 do not support an association with dementia. Ok on clonazepam  1.5 mg HS with sleep to 2 mg .  Counseled patient regarding potential benefits, risks, and side effects of lithium  to include potential risk of lithium  affecting thyroid  and renal function.  Discussed need for periodic lab monitoring to determine drug level and to assess for potential adverse effects.  Counseled patient regarding signs and symptoms of lithium  toxicity and advised that they notify office immediately or seek urgent medical attention if experiencing these signs and symptoms.  Patient advised to contact office with any questions or concerns.  Disc lithium  and toxicity. lithium  to morning to eliminate nocturia. Continue lithium  450 mg  resolved recurrent SI  She forgot to make the switch in lithium  timing after the last visit.  Vitamin D  5000 units daily  FU2 mos  Lorene Macintosh, MD, DFAPA  Please see After Visit Summary for patient specific instructions.   No future appointments.    No orders of the defined types were placed in this encounter.      Lorene Macintosh, MD, DFAPA   -------------------------------

## 2023-11-02 NOTE — Telephone Encounter (Signed)
 Rx for Concerta  was sent at appt today to the requested pharmacy. I told patient she can't have both medications at the same time.

## 2023-11-02 NOTE — Telephone Encounter (Signed)
 Next visit is 01/18/24. Patient is requesting a refill on Concerta  36 mg and Vyvanse  70 mg called to:  [facility]   Phone: [phone]  Fax: [phone]     Concerta  goes to:  [id number] - [place], [place] - [address] AT [facility] [address] [facility] [address]      Phone: [phone]  Fax: [phone]

## 2023-11-08 ENCOUNTER — Other Ambulatory Visit: Payer: Self-pay | Admitting: Obstetrics and Gynecology

## 2023-11-08 DIAGNOSIS — Z1231 Encounter for screening mammogram for malignant neoplasm of breast: Secondary | ICD-10-CM

## 2023-11-11 NOTE — Telephone Encounter (Signed)
 Good afternoon Dr. Legrand  Are you willing to release this patient to be under Dr. Lafonda care?

## 2023-11-13 ENCOUNTER — Other Ambulatory Visit: Payer: Self-pay | Admitting: Psychiatry

## 2023-11-13 DIAGNOSIS — F5105 Insomnia due to other mental disorder: Secondary | ICD-10-CM

## 2023-11-14 NOTE — Telephone Encounter (Signed)
 Pt called at 9:05a requesting refill of Klonopin .  Pt states she took last pill last night and will not be able to go to sleep without it.  Send to   [id number] [place], [place] - [address] AT [facility] [address],  [place] [address] Phone: [phone]  Fax: [phone]

## 2023-11-15 NOTE — Telephone Encounter (Signed)
 Yes of course, if that is the patient's preference.  - HD

## 2023-11-28 ENCOUNTER — Other Ambulatory Visit: Payer: Self-pay | Admitting: Psychiatry

## 2023-11-28 ENCOUNTER — Telehealth: Payer: Self-pay | Admitting: Psychiatry

## 2023-11-28 MED ORDER — BUPROPION HCL ER (XL) 300 MG PO TB24: 300.0000 mg | ORAL_TABLET | Freq: Every day | ORAL | 0 refills | Status: DC

## 2023-11-28 NOTE — Telephone Encounter (Signed)
 Sent!

## 2023-11-28 NOTE — Telephone Encounter (Signed)
 Pt lvm that she needs a refill on her wellbutrin  xl 300 mg. [facility] [address]

## 2023-11-29 ENCOUNTER — Other Ambulatory Visit: Payer: Self-pay | Admitting: Psychiatry

## 2023-11-29 DIAGNOSIS — F339 Major depressive disorder, recurrent, unspecified: Secondary | ICD-10-CM

## 2023-12-13 ENCOUNTER — Telehealth: Payer: Self-pay | Admitting: Acute Care

## 2023-12-13 DIAGNOSIS — Z122 Encounter for screening for malignant neoplasm of respiratory organs: Secondary | ICD-10-CM

## 2023-12-13 DIAGNOSIS — Z87891 Personal history of nicotine dependence: Secondary | ICD-10-CM

## 2023-12-13 NOTE — Telephone Encounter (Signed)
 Lung Cancer Screening Narrative/Criteria Questionnaire (Cigarette Smokers Only- No Cigars/Pipes/vapes)   [name]   SDMV:12/22/23 at 11a/[place]                                           Jan 25, 1966                LDCT: 12/27/23 at 10a/[place] [facility]    58 y.o.   Phone: [phone]  Lung Screening Narrative (confirm age 38-77 yrs Medicare / 50-80 yrs Private pay insurance)   Insurance information:[employer]   Referring Provider:Morrison (pt self referred)   This screening involves an initial phone call with a team member from our program. It is called a shared decision making visit. The initial meeting is required by insurance and Medicare to make sure you understand the program. This appointment takes about 15-20 minutes to complete. The CT scan will completed at a separate date/time. This scan takes about 5-10 minutes to complete and you may eat and drink before and after the scan.  Criteria questions for Lung Cancer Screening:   Are you a current or former smoker? Former Age began smoking: 13y   If you are a former smoker, what year did you quit smoking? 2021? Plus minus another 10 yrs   To calculate your smoking history, I need an accurate estimate of how many packs of cigarettes you smoked per day and for how many years. (Not just the number of PPD you are now smoking)   Years smoking 31 x Packs per day 3/4pk = Pack years 23   (at least 20 pack yrs)   (Make sure they understand that we need to know how much they have smoked in the past, not just the number of PPD they are smoking now)  Do you have a personal history of cancer?  No    Do you have a family history of cancer? No  Are you coughing up blood?  No  Have you had unexplained weight loss of 15 lbs or more in the last 6 months? No  It looks like you meet all criteria.     Additional information: N/A

## 2023-12-22 ENCOUNTER — Encounter: Payer: Self-pay | Admitting: Podiatry

## 2023-12-22 ENCOUNTER — Encounter: Payer: Self-pay | Admitting: *Deleted

## 2023-12-22 ENCOUNTER — Telehealth: Payer: Self-pay | Admitting: Psychiatry

## 2023-12-22 ENCOUNTER — Other Ambulatory Visit (HOSPITAL_BASED_OUTPATIENT_CLINIC_OR_DEPARTMENT_OTHER): Payer: Self-pay

## 2023-12-22 ENCOUNTER — Ambulatory Visit (INDEPENDENT_AMBULATORY_CARE_PROVIDER_SITE_OTHER): Payer: Self-pay | Admitting: Podiatry

## 2023-12-22 ENCOUNTER — Ambulatory Visit: Admitting: *Deleted

## 2023-12-22 ENCOUNTER — Other Ambulatory Visit: Payer: Self-pay

## 2023-12-22 VITALS — Ht 63.0 in | Wt 135.6 lb

## 2023-12-22 DIAGNOSIS — M2011 Hallux valgus (acquired), right foot: Secondary | ICD-10-CM

## 2023-12-22 DIAGNOSIS — L84 Corns and callosities: Secondary | ICD-10-CM | POA: Diagnosis not present

## 2023-12-22 DIAGNOSIS — Z87891 Personal history of nicotine dependence: Secondary | ICD-10-CM

## 2023-12-22 DIAGNOSIS — F900 Attention-deficit hyperactivity disorder, predominantly inattentive type: Secondary | ICD-10-CM

## 2023-12-22 DIAGNOSIS — M21619 Bunion of unspecified foot: Secondary | ICD-10-CM | POA: Diagnosis not present

## 2023-12-22 MED ORDER — LISDEXAMFETAMINE DIMESYLATE 70 MG PO CAPS
70.0000 mg | ORAL_CAPSULE | Freq: Every day | ORAL | 0 refills | Status: DC
  Filled 2023-12-22: qty 30, 30d supply, fill #0

## 2023-12-22 NOTE — Progress Notes (Signed)
 Chief Complaint  Patient presents with   Callouses    Pt is here due to callous on the right foot in between the great and 2nd toe, she states it has been bothering her, daughter is getting married in October can't wear shoes comfortably due to callous.    HPI: 58 y.o. female presents today with recurrent callus to right second toe associated with bunion deformity.  She states that she is not ready to discuss surgery at this time.  The callus is the main area of concern.  She does have a daughter getting married next month.  Past Medical History:  Diagnosis Date   Abdominal pain    Anal itching    Anxiety    Breast inflammation    Breast nodule    left   Chronic kidney disease    Cystocele    Decreased libido    Depression    Diverticular disease    Eczema    Fibroadenoma    Galactorrhea    IBS (irritable bowel syndrome)    Mastodynia    Pelvic pain in female    Pelvic relaxation    Rectocele    Seizure-like activity (HCC)    Serotonin syndrome     Past Surgical History:  Procedure Laterality Date   ABDOMINAL HYSTERECTOMY     partial   BREAST BIOPSY     HERNIA REPAIR     INCONTINENCE SURGERY     OVARIAN CYST REMOVAL     left    No Known Allergies  ROS    Physical Exam: There were no vitals filed for this visit.  General: The patient is alert and oriented x3 in no acute distress.  Dermatology: Skin is warm, dry and supple bilateral lower extremities. Interspaces are clear of maceration and debris.  Hyperkeratotic lesion present medial aspect of right second PIPJ without underlying ulceration.  There is corresponding lesion with minimal buildup on the right hallux IPJ.  Vascular: Palpable pedal pulses bilaterally. Capillary refill within normal limits.  No appreciable edema.  No erythema or calor.  Neurological: Light touch sensation grossly intact bilateral feet.   Musculoskeletal Exam: There is a bony medial prominence on the dorsomedial aspect of  the 1st metatarsal head of the right foot.  There is pain on palpation of the bump in this area.  Lateral deviation of the hallux at the MPJ level.  1st MPJ ROM is decreased.  No crepitus.  Hallux is tracking, not trackbound.  No pain on palpation at the first metatarsal-medial cuneiform joint.  No significant hypermobility of the first ray    Assessment/Plan of Care: 1. Hallux abductovalgus, acquired, right   2. Bunion   3. Corn of toe      No orders of the defined types were placed in this encounter.  None  Discussed clinical findings with patient today.  Right second toe corn was palliatively debrided today using a #15 blade to patient tolerance without incident.  Salinocaine and Band-Aid applied.  Discussed home management strategies including offloading of the lesion, use of creams or lotions to soften the lesion, recommended scrubbing the lesions with white vinegar to help keep this area soft.  Did apply aperture corn pad today.  Did briefly discuss surgical treatment with the patient.  She is not ready to proceed with surgery at this time.  She will consider this going forward.  Discussed continued use of toe spacers, shoe gear modification, bunion shields.  Follow-up as  needed   Elyana Grabski L. [place] MAUL, AACFAS [facility]     [address] [place], [place] [address]                Office [phone]  Fax [phone]

## 2023-12-22 NOTE — Patient Instructions (Signed)

## 2023-12-22 NOTE — Telephone Encounter (Signed)
 Pt called at 9:02a stating that she has been taking Concerta .  She said she doesn't feel like it's helping and in fact she feels worse.  She would like to go back to Vyvanse .  Pls send script to  [facility] [address], [place] [address] Phone: [phone]  Fax: [phone]   Next appt 10/15

## 2023-12-22 NOTE — Progress Notes (Signed)
 Virtual Visit via Telephone Note  I connected with [name] on 12/22/23 at 11:00 AM EDT by telephone and verified that I am speaking with the correct person using two identifiers.  Location: Patient: at home Provider: [address] [place], [place], [address]    I discussed the limitations, risks, security and privacy concerns of performing an evaluation and management service by telephone and the availability of in person appointments. I also discussed with the patient that there may be a patient responsible charge related to this service. The patient expressed understanding and agreed to proceed.   Shared Decision Making Visit Lung Cancer Screening Program [phone])   Eligibility: Age 58 y.o. Pack Years Smoking History Calculation 23 (# packs/per year x # years smoked) Recent History of coughing up blood  no Unexplained weight loss? no ( >Than 15 pounds within the last 6 months ) Prior History Lung / other cancer no (Diagnosis within the last 5 years already requiring surveillance chest CT Scans). Smoking Status Former Smoker Former Smokers: Years since quit: 7 years  Quit Date: 2018  Visit Components: Discussion included one or more decision making aids. yes Discussion included risk/benefits of screening. yes Discussion included potential follow up diagnostic testing for abnormal scans. yes Discussion included meaning and risk of over diagnosis. yes Discussion included meaning and risk of False Positives. yes Discussion included meaning of total radiation exposure. yes  Counseling Included: Importance of adherence to annual lung cancer LDCT screening. yes Impact of comorbidities on ability to participate in the program. yes Ability and willingness to under diagnostic treatment. yes  Smoking Cessation Counseling: Current Smokers:  Discussed importance of smoking cessation. yes Information about tobacco cessation classes and interventions provided to  patient. yes Patient provided with ticket for LDCT Scan. no Symptomatic Patient. yes  Counseling(Intermediate counseling: > three minutes) [address] Diagnosis Code: Tobacco Use Z72.0 Asymptomatic Patient yes   Counseled patient 4 minutes regarding tobacco use.   Former Smokers:  Discussed the importance of maintaining cigarette abstinence. yes Diagnosis Code: Personal History of Nicotine  Dependence. S12.108 Information about tobacco cessation classes and interventions provided to patient. Yes Patient provided with ticket for LDCT Scan. yes Written Order for Lung Cancer Screening with LDCT placed in Epic. Yes (CT Chest Lung Cancer Screening Low Dose W/O CM) [id number] Z12.2-Screening of respiratory organs Z87.891-Personal history of nicotine  dependence   Laneta Speaks, RN

## 2023-12-22 NOTE — Telephone Encounter (Signed)
 LF 8/8, 72 mg

## 2023-12-22 NOTE — Telephone Encounter (Signed)
Pended Vyvanse 

## 2023-12-27 ENCOUNTER — Ambulatory Visit
Admission: RE | Admit: 2023-12-27 | Discharge: 2023-12-27 | Disposition: A | Discharge status: Home / Self Care | Source: Ambulatory Visit | Attending: Acute Care | Admitting: Acute Care

## 2023-12-27 DIAGNOSIS — Z122 Encounter for screening for malignant neoplasm of respiratory organs: Secondary | ICD-10-CM

## 2023-12-27 DIAGNOSIS — Z87891 Personal history of nicotine dependence: Secondary | ICD-10-CM

## 2023-12-29 ENCOUNTER — Telehealth: Payer: Self-pay | Admitting: Acute Care

## 2023-12-29 NOTE — Telephone Encounter (Signed)
 Patient called in about a letter she received. Patient received her AVS in the mail after her SDMV. Patient was confused about the Lung Rads categories. We discussed this with the pt as this is just a key and not her results. Patient had her LDCT on 12/27/23, pt aware we will reach out to her with her results once received. Pt verbalized understanding and denied any further questions or concerns at this time.

## 2024-01-03 ENCOUNTER — Other Ambulatory Visit: Payer: Self-pay

## 2024-01-03 DIAGNOSIS — F17210 Nicotine dependence, cigarettes, uncomplicated: Secondary | ICD-10-CM

## 2024-01-03 DIAGNOSIS — Z122 Encounter for screening for malignant neoplasm of respiratory organs: Secondary | ICD-10-CM

## 2024-01-03 DIAGNOSIS — Z87891 Personal history of nicotine dependence: Secondary | ICD-10-CM

## 2024-01-04 ENCOUNTER — Other Ambulatory Visit: Payer: Self-pay | Admitting: Psychiatry

## 2024-01-04 NOTE — Telephone Encounter (Signed)
 Pt called and said that the script fr the vraylar  is wrong . She needs a new script that says that she take 1 pill 2 a week. Please call her at [phone]

## 2024-01-12 ENCOUNTER — Encounter: Payer: Self-pay | Admitting: Physician Assistant

## 2024-01-12 ENCOUNTER — Telehealth: Payer: Self-pay | Admitting: Acute Care

## 2024-01-12 NOTE — Telephone Encounter (Signed)
 Patient called in and left VM for LCS to go over her LDCT results over the phone with her. Called patient back and it went straight to her VM. Left on VM that she may have a spam blocker on which will not allow her phone to ring if the calling number is not saved. Advised pt to call back and we will review her LDCT results with her.

## 2024-01-17 ENCOUNTER — Other Ambulatory Visit: Payer: Self-pay | Admitting: Psychiatry

## 2024-01-17 ENCOUNTER — Other Ambulatory Visit (HOSPITAL_BASED_OUTPATIENT_CLINIC_OR_DEPARTMENT_OTHER): Payer: Self-pay

## 2024-01-17 DIAGNOSIS — F900 Attention-deficit hyperactivity disorder, predominantly inattentive type: Secondary | ICD-10-CM

## 2024-01-17 NOTE — Telephone Encounter (Signed)
Due 10/16

## 2024-01-18 ENCOUNTER — Telehealth: Payer: Self-pay | Admitting: Psychiatry

## 2024-01-18 ENCOUNTER — Ambulatory Visit: Admitting: Psychiatry

## 2024-01-18 ENCOUNTER — Encounter: Payer: Self-pay | Admitting: Psychiatry

## 2024-01-18 ENCOUNTER — Other Ambulatory Visit: Payer: Self-pay

## 2024-01-18 DIAGNOSIS — F339 Major depressive disorder, recurrent, unspecified: Secondary | ICD-10-CM

## 2024-01-18 DIAGNOSIS — R7989 Other specified abnormal findings of blood chemistry: Secondary | ICD-10-CM

## 2024-01-18 DIAGNOSIS — F900 Attention-deficit hyperactivity disorder, predominantly inattentive type: Secondary | ICD-10-CM | POA: Diagnosis not present

## 2024-01-18 DIAGNOSIS — F5105 Insomnia due to other mental disorder: Secondary | ICD-10-CM | POA: Diagnosis not present

## 2024-01-18 DIAGNOSIS — F411 Generalized anxiety disorder: Secondary | ICD-10-CM

## 2024-01-18 MED ORDER — BUPROPION HCL ER (XL) 300 MG PO TB24: 300.0000 mg | ORAL_TABLET | Freq: Every day | ORAL | 1 refills | Status: AC

## 2024-01-18 MED ORDER — LITHIUM CARBONATE 150 MG PO CAPS: 450.0000 mg | ORAL_CAPSULE | Freq: Every evening | ORAL | 1 refills | Status: AC

## 2024-01-18 MED ORDER — LISDEXAMFETAMINE DIMESYLATE 70 MG PO CAPS: 70.0000 mg | ORAL_CAPSULE | Freq: Every day | ORAL | 0 refills | Status: DC

## 2024-01-18 MED ORDER — CLONAZEPAM 1 MG PO TABS: ORAL_TABLET | ORAL | 2 refills | Status: AC

## 2024-01-18 MED ORDER — LISDEXAMFETAMINE DIMESYLATE 70 MG PO CAPS
70.0000 mg | ORAL_CAPSULE | Freq: Every day | ORAL | 0 refills | Status: DC
  Filled 2024-01-19: qty 30, 30d supply, fill #0

## 2024-01-18 MED ORDER — RAMELTEON 8 MG PO TABS: 8.0000 mg | ORAL_TABLET | Freq: Every day | ORAL | 1 refills | Status: DC

## 2024-01-18 MED ORDER — CARIPRAZINE HCL 1.5 MG PO CAPS
1.5000 mg | ORAL_CAPSULE | Freq: Every day | ORAL | 0 refills | Status: AC
Start: 2024-01-18 — End: ?

## 2024-01-18 NOTE — Telephone Encounter (Signed)
 [facility] p[facility] to [facility].

## 2024-01-18 NOTE — Progress Notes (Signed)
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 history were reviewed and updated as appropriate.   Please see review of systems for further details on the patient's review from today.   Objective:   Physical Exam:  LMP 05/20/2010   Physical Exam Constitutional:      General: She is not in acute distress.    Appearance: She is well-developed.  Musculoskeletal:        General: No deformity.  Neurological:     Mental Status: She is alert and oriented to person, place, and time.     Cranial Nerves: No dysarthria.     Coordination: Coordination normal.  Psychiatric:        Attention and Perception: Attention and perception normal. She does not perceive auditory or visual hallucinations.        Mood and Affect: Mood is anxious. Mood is not depressed. Affect is not labile, blunt or inappropriate.        Speech: Speech normal.        Behavior: Behavior normal. Behavior is not agitated. Behavior is cooperative.        Thought Content: Thought content normal. Thought content is not paranoid or delusional. Thought content does not include homicidal or suicidal ideation. Thought content does not include suicidal plan.        Cognition and Memory: Cognition and memory normal.        Judgment: Judgment normal.     Comments: Insight intact SI resolved  with lithium  Less dep on Vraylar       Lab Review:     Component Value Date/Time   NA 140 03/01/2020 1731   K 3.8 03/01/2020 1731   CL 102 03/01/2020 1731   CO2 25 03/01/2020 1731   GLUCOSE 92 03/01/2020 1731   BUN 9 03/01/2020 1731   CREATININE 1.17 (H) 03/01/2020 1731   CALCIUM 10.0 03/01/2020 1731   PROT 6.8 03/01/2020 1731   ALBUMIN 4.5 03/01/2020 1731   AST 28 03/01/2020 1731   ALT 23 01/21/2021 0957   ALKPHOS 68 03/01/2020 1731   BILITOT 0.8 03/01/2020 1731   GFRNONAA 55 (L) 03/01/2020 1731   GFRAA >60 10/10/2015 1115       Component Value Date/Time   WBC 7.3 03/01/2020 1731   RBC 5.12 (H) 03/01/2020 1731   HGB 16.5 (  H) 03/01/2020 1731   HCT 50.9 (H) 03/01/2020 1731   PLT 211 03/01/2020 1731   MCV 99.4 03/01/2020 1731   MCH 32.2 03/01/2020 1731   MCHC 32.4 03/01/2020 1731   RDW 11.9 03/01/2020 1731   LYMPHSABS 1.2 03/01/2020 1731   MONOABS 0.4 03/01/2020 1731   EOSABS 0.1 03/01/2020 1731   BASOSABS 0.1 03/01/2020 1731    Lithium  Lvl  Date Value Ref Range Status  03/01/2020 0.41 (L) 0.60 - 1.20 mmol/L Final    Comment:    Performed at [facility], [address] [place], [place] [address]     No results found for: PHENYTOIN, PHENOBARB, VALPROATE, CBMZ   Lithium  level received April 27, 2018 was 0.7.  This is probably adequate to help reduce the risk of relapse of depression post TMS.  Therefore no med changes.    Vitamin [name]  level April 27, 2018 on supplement was 60 and adequate. .res Assessment: Plan:    [name] was seen today for follow-up, depression, add and sleeping problem.  Diagnoses and all orders for this visit:  Recurrent major depression resistant to treatment -     lithium  carbonate 150 MG capsule; Take 3 capsules (450 mg total) by mouth every evening. -     cariprazine  (VRAYLAR ) 1.5 MG capsule; Take 1 capsule (1.5 mg total) by mouth daily. -     buPROPion  (WELLBUTRIN  XL) 300 MG 24 hr tablet; Take 1 tablet (300 mg  total) by mouth daily.  Generalized anxiety disorder  Insomnia due to mental condition -     ramelteon (ROZEREM) 8 MG tablet; Take 1 tablet (8 mg total) by mouth at bedtime. -     clonazePAM  (KLONOPIN ) 1 MG tablet; TAKE 1 TO 1.5 TABLETS BY MOUTH AT BEDTIME FOR SLEEP  Attention deficit hyperactivity disorder (ADHD), predominantly inattentive type -     lisdexamfetamine (VYVANSE ) 70 MG capsule; Take 1 capsule (70 mg total) by mouth daily.  Low vitamin [name]  level    We discussed her severe TRD hx and other dxes.  Reviewed multiple failed medications noted above.   Has been more depressed   since off th e Vraylar  markedly.  Agrees to resume it.  Wellbutrin  reduced to 300 bc GTC sz of unknown origin.  Was better with 450 mg but OK so far with reduction. SZ WU normal   Defer per her request NAC 600 mg or more daily.  She feels her protein intake is adequate and her weight is stable.  Sleep bettter at present.  Option Aricept DT memory concerns.  She doesn't want more meds.  Her SI is gone after increasing lithium  to 450 mg for SI and depression.   Vraylar  1.5 mg twice weekly.  We discussed the short-term risks associated with benzodiazepines including sedation and increased fall risk among others.  Discussed long-term side effect risk including dependence, potential withdrawal symptoms, and the potential eventual dose-related risk of dementia.  But recent studies from 2020 dispute this association between benzodiazepines and dementia risk. Newer studies in 2020 do not support an association with dementia. Ok on clonazepam  1 mg HS  but wants to try something afain to see if she can get off it.. Never tried Rozerem 8 mg HS.  Will try it.    Counseled patient regarding potential benefits, risks, and side effects of lithium  to include potential risk of lithium  affecting thyroid  and renal function.  Discussed need for periodic lab monitoring to determine drug level and to assess for  potential  adverse effects.  Counseled patient regarding signs and symptoms of lithium  toxicity and advised that they notify office immediately or seek urgent medical attention if experiencing these signs and symptoms.  Patient advised to contact office with any questions or concerns.  Disc lithium  and toxicity. lithium  to morning to eliminate nocturia. Continue lithium  450 mg  resolved recurrent SI  She forgot to make the switch in lithium  timing after the last visit.  Vitamin [name]  5000 units daily  FU 3-4 mos  Lorene Macintosh, MD, DFAPA  Please see After Visit Summary for patient specific instructions.   Future Appointments  Date Time Provider Department Center  03/05/2024  1:50 PM Craig Alan SAUNDERS, PA-C [facility]      No orders of the defined types were placed in this encounter.      Lorene Macintosh, MD, DFAPA   -------------------------------

## 2024-01-18 NOTE — Telephone Encounter (Signed)
 Pt needs Vyvanse  to go to [facility].

## 2024-01-19 ENCOUNTER — Other Ambulatory Visit (HOSPITAL_BASED_OUTPATIENT_CLINIC_OR_DEPARTMENT_OTHER): Payer: Self-pay

## 2024-01-27 ENCOUNTER — Ambulatory Visit (INDEPENDENT_AMBULATORY_CARE_PROVIDER_SITE_OTHER): Admitting: Family Medicine

## 2024-01-27 ENCOUNTER — Other Ambulatory Visit: Payer: Self-pay

## 2024-01-27 ENCOUNTER — Encounter: Payer: Self-pay | Admitting: Family Medicine

## 2024-01-27 VITALS — BP 124/82 | Ht 63.0 in | Wt 120.0 lb

## 2024-01-27 DIAGNOSIS — M25551 Pain in right hip: Secondary | ICD-10-CM

## 2024-01-27 DIAGNOSIS — M7061 Trochanteric bursitis, right hip: Secondary | ICD-10-CM | POA: Diagnosis not present

## 2024-01-27 DIAGNOSIS — M67951 Unspecified disorder of synovium and tendon, right thigh: Secondary | ICD-10-CM

## 2024-01-27 MED ORDER — METHYLPREDNISOLONE ACETATE 40 MG/ML IJ SUSP
40.0000 mg | Freq: Once | INTRAMUSCULAR | Status: AC
  Administered 2024-01-27: 40 mg via INTRA_ARTICULAR

## 2024-01-27 NOTE — Progress Notes (Signed)
 [facility] A Department of [facility]   PCP: Anita Bernardino [name], FNP  CHIEF COMPLAINT:  right lateral hip pain   HPI: Patient is a pleasant 58 y.o. female who presents today for evaluation on R lateral hip pain that has been bothering her on/off for 1-2 years.  Pain happens when standing from seated position or balancing on a single leg.  Pain also occurs with hip abduction.  Never recalls any specific injury that caused onset of pain.  Recently she has been seeing a chiropractor for her hip pain and he has been giving her home exercises and been performing in office treatments using a variety of modalities.  So far pain not improving.  Chiropractor referred here for consideration of ultrasound-guided injection.  She has also seen orthopedist at [facility] and requested MRI of her right hip.  MRI of right hip shows no acute intra-articular pathology but presence of mild right glute medius tendinopathy.  Has not received any prior injections.  Patient is active and enjoys lifting weights 5x/week.    PMH:  Past Medical History:  Diagnosis Date   Abdominal pain    Anal itching    Anxiety    Breast inflammation    Breast nodule    left   Chronic kidney disease    Cystocele    Decreased libido    Depression    Diverticular disease    Eczema    Fibroadenoma    Galactorrhea    IBS (irritable bowel syndrome)    Mastodynia    Pelvic pain in female    Pelvic relaxation    Rectocele    Seizure-like activity (HCC)    Serotonin syndrome     Patient Active Problem List   Diagnosis Date Noted   Hallux abductovalgus, acquired, right 05/24/2023   Bunion 11/09/2021   Cough 12/12/2019   Laryngopharyngeal reflux 12/12/2019   Diverticular disease 12/28/2018   Polyp of colon 12/28/2018   Menopausal symptom 12/29/2017   Pain in pelvis 12/29/2017   Eczema 08/02/2017   Low grade squamous intraepithelial lesion (LGSIL) on cervicovaginal  cytologic smear 06/08/2016   Major depressive disorder, recurrent episode, in partial remission 11/01/2015   Insomnia 09/18/2015   Partner relationship problems 03/27/2015   GAD (generalized anxiety disorder) 12/31/2014   Major depressive disorder, recurrent, severe without psychotic features (HCC) 08/06/2014   Disturbance of skin sensation 08/31/2012   IRRITABLE BOWEL SYNDROME 10/15/2008   DEPRESSION, MAJOR 06/29/2007   GASTRITIS, HX OF 12/31/2003    PSurg:  Past Surgical History:  Procedure Laterality Date   ABDOMINAL HYSTERECTOMY     partial   BREAST BIOPSY     HERNIA REPAIR     INCONTINENCE SURGERY     OVARIAN CYST REMOVAL     left    Allergies: Patient has no known allergies.  Meds:  Previous Medications   BUPROPION  (WELLBUTRIN  XL) 300 MG 24 HR TABLET    Take 1 tablet (300 mg total) by mouth daily.   CARIPRAZINE  (VRAYLAR ) 1.5 MG CAPSULE    Take 1 capsule (1.5 mg total) by mouth daily.   CHOLECALCIFEROL (VITAMIN D3) 50 MCG (2000 UT) TABS    Take 2,000 Units by mouth daily with breakfast.   CLONAZEPAM  (KLONOPIN ) 1 MG TABLET    TAKE 1 TO 1.5 TABLETS BY MOUTH AT BEDTIME FOR SLEEP   CYANOCOBALAMIN ER (B-12 DUAL SPECTRUM) 5000 MCG TBCR    Take 1 tablet by mouth daily with  breakfast.   LISDEXAMFETAMINE (VYVANSE ) 70 MG CAPSULE    Take 1 capsule (70 mg total) by mouth daily.   LITHIUM  CARBONATE 150 MG CAPSULE    Take 3 capsules (450 mg total) by mouth every evening.   PANTOPRAZOLE (PROTONIX) 40 MG TABLET    Take 40 mg by mouth daily.   RAMELTEON (ROZEREM) 8 MG TABLET    Take 1 tablet (8 mg total) by mouth at bedtime.    Social:  Social History   Tobacco Use   Smoking status: Former    Current packs/day: 0.00    Average packs/day: 0.8 packs/day for 36.0 years (27.0 ttl pk-yrs)    Types: Cigarettes    Start date: 27    Quit date: 2018    Years since quitting: 7.8   Smokeless tobacco: Never  Substance Use Topics   Alcohol use: Yes    Alcohol/week: 1.0 standard drink of  alcohol    Types: 1 Glasses of wine per week    Comment: one drink about once a month    REVIEW OF SYSTEMS:  ROS negative except as noted in HPI above   Objective Exam:  Vitals:   01/27/24 1018  BP: 124/82  Weight: 120 lb (54.4 kg)  Height: 5' 3 (1.6 m)    GENERAL: Patient is afebrile, Vital signs reviewed, well appearing, Patient appears comfortable, Alert and lucid. No apparent distress.   Physical Exam   Ortho Exam: On inspection of right hip no evidence of erythema, ecchymosis, or edema.  Patient has tenderness to palpation over her greater trochanter at insertion of glute medius.  Patient is full active passive range of motion without significant pain.  Patient has pain on Trendelenburg testing.  Patient has decreased strength with right hip abduction testing, 4/5 and reproduction of pain.  Neurovascular intact distally.  RESULTS:  Labs: No results found for this or any previous visit (from the past 48 hours).  Imaging:  No orders to display   US -Guided Greater Trochanteric Bursa Injection, Right After discussion on risks/benefits/indications and informed written consent was obtained, a timeout was performed. The patient was lying in lateral recumbent position on exam table. Using ultrasound guidance, the greater trochanter was identified. The area overlying the trochanteric bursa was then prepped with chlorhexadine and alcohol swabs. Following sterile precautions, ultrasound was reapplied to visualize needle guidance with a 21-gauge 1.5 needle utilizing an in-plane approach to inject the bursa with 4:1 lidocaine:methylprednisone. Delivery of the injectate was visualized into the region of hypoechoic fluid of the greater trochanteric bursa. Patient tolerated procedure well without immediate complications.    Assessment/Plan:  1. Right hip pain   2. Trochanteric bursitis of right hip   3. Tendinopathy of right gluteus medius    - Ultrasound-guided corticosteroid  injection of right greater trochanter bursa injection completed at today's office visit - Patient noticed immediate relief of her pain from the numbing medication -Patient instructed to have a relative rest.  To 48 hours after injection - Thereafter patient will continue home exercise program as demonstrated by our ATC today and given to her prior by her chiropractor for right hip abduction strengthening. - Return to clinic in 3-4 weeks for reevaluation s/p corticosteroid injection. - Patient understands and agrees to treatment plan.  No further questions or concerns at this time.  New Prescriptions   No medications on file    Medications, medical history, allergies, surgical history, hospitalizations, family history, social history, ROS and vitals entered by nursing staff and reviewed  by myself.  I discussed with the patient the diagnosis, treatment plan, indications for return to the emergency department, and for expected follow-up. The patient verbalized an understanding. The patient is asked if there are any questions or concerns. We discuss the case, until all issues are addressed to the patient's satisfaction.  Follow up per instructions including returning for additional office visit if symptoms worsen or proceeding to the emergency department or urgent care in the next 12-24hrs if there is an acute concerning increasing symptoms, pain, fevers, or other symptoms.  Prentice Agent, DO  5:03 PM, 01/27/2024

## 2024-01-28 NOTE — Progress Notes (Signed)
 [facility]: Attending Note: I have examined the patient, reviewed the chart, discussed the assessment and plan with the Sports Medicine Fellow.   Reviewed report she brought of right hip MRI which showed gluteal muscle tendinopathy. Report scanned to chart I agree with assessment and treatment plan as detailed in the Fellow's note. I was present and supervised key portions of ultrasound guided injection of greater trochanteric bursa of hip.

## 2024-02-14 ENCOUNTER — Other Ambulatory Visit: Payer: Self-pay | Admitting: Psychiatry

## 2024-02-14 DIAGNOSIS — F5105 Insomnia due to other mental disorder: Secondary | ICD-10-CM

## 2024-02-17 ENCOUNTER — Other Ambulatory Visit (HOSPITAL_BASED_OUTPATIENT_CLINIC_OR_DEPARTMENT_OTHER): Payer: Self-pay

## 2024-02-17 ENCOUNTER — Telehealth: Payer: Self-pay | Admitting: Psychiatry

## 2024-02-17 ENCOUNTER — Other Ambulatory Visit: Payer: Self-pay

## 2024-02-17 DIAGNOSIS — F900 Attention-deficit hyperactivity disorder, predominantly inattentive type: Secondary | ICD-10-CM

## 2024-02-17 MED ORDER — LISDEXAMFETAMINE DIMESYLATE 70 MG PO CAPS
70.0000 mg | ORAL_CAPSULE | Freq: Every day | ORAL | 0 refills | Status: AC
Start: 2024-04-13 — End: ?

## 2024-02-17 MED ORDER — LISDEXAMFETAMINE DIMESYLATE 70 MG PO CAPS
70.0000 mg | ORAL_CAPSULE | Freq: Every day | ORAL | 0 refills | Status: AC
Start: 2024-02-17 — End: ?
  Filled 2024-02-17: qty 30, 30d supply, fill #0

## 2024-02-17 MED ORDER — LISDEXAMFETAMINE DIMESYLATE 70 MG PO CAPS
70.0000 mg | ORAL_CAPSULE | Freq: Every day | ORAL | 0 refills | Status: AC
Start: 2024-03-16 — End: ?
  Filled 2024-03-19: qty 30, 30d supply, fill #0

## 2024-02-17 NOTE — Telephone Encounter (Signed)
 Pt requesting Vyvanse  70 mg Rx to [facility].

## 2024-02-17 NOTE — Telephone Encounter (Signed)
 Pended 3 RF of Vyvanse  to [facility].

## 2024-02-24 ENCOUNTER — Ambulatory Visit: Admitting: Family Medicine
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
## 2024-03-05 ENCOUNTER — Encounter: Payer: Self-pay | Admitting: Physician Assistant

## 2024-03-05 ENCOUNTER — Ambulatory Visit: Admitting: Physician Assistant

## 2024-03-05 VITALS — BP 120/72 | HR 78 | Ht 61.5 in | Wt 120.0 lb

## 2024-03-05 DIAGNOSIS — K589 Irritable bowel syndrome without diarrhea: Secondary | ICD-10-CM | POA: Diagnosis not present

## 2024-03-05 DIAGNOSIS — K635 Polyp of colon: Secondary | ICD-10-CM

## 2024-03-05 DIAGNOSIS — K5790 Diverticulosis of intestine, part unspecified, without perforation or abscess without bleeding: Secondary | ICD-10-CM

## 2024-03-05 DIAGNOSIS — K219 Gastro-esophageal reflux disease without esophagitis: Secondary | ICD-10-CM | POA: Diagnosis not present

## 2024-03-05 MED ORDER — LINACLOTIDE 145 MCG PO CAPS: 145.0000 ug | ORAL_CAPSULE | Freq: Every day | ORAL | 0 refills | Status: DC

## 2024-03-05 NOTE — Patient Instructions (Addendum)
 Toileting tips to help with your constipation - Drink at least 64-80 ounces of water/liquid per day. - Establish a time to try to move your bowels every day.  For many people, this is after a cup of coffee or after a meal such as breakfast. - Sit all of the way back on the toilet keeping your back fairly straight and while sitting up, try to rest the tops of your forearms on your upper thighs.   - Raising your feet with a step stool/squatty potty can be helpful to improve the angle that allows your stool to pass through the rectum. - Relax the rectum feeling it bulge toward the toilet water.  If you feel your rectum raising toward your body, you are contracting rather than relaxing. - Breathe in and slowly exhale. Belly breath by expanding your belly towards your belly button. Keep belly expanded as you gently direct pressure down and back to the anus.  A low pitched GRRR sound can assist with increasing intra-abdominal pressure.  (Can also trying to blow on a pinwheel and make it move, this helps with the same belly breathing) - Repeat 3-4 times. If unsuccessful, contract the pelvic floor to restore normal tone and get off the toilet.  Avoid excessive straining. - To reduce excessive wiping by teaching your anus to normally contract, place hands on outer aspect of knees and resist knee movement outward.  Hold 5-10 second then place hands just inside of knees and resist inward movement of knees.  Hold 5 seconds.  Repeat a few times each way.  Go to the ER if unable to pass gas, severe AB pain, unable to hold down food, any shortness of breath of chest pain.  Please do the following: Purchase a bottle of Miralax over the counter as well as a box of 5 mg dulcolax tablets. Take 4 dulcolax tablets. Wait 1 hour. You will then drink 6-8 capfuls of Miralax mixed in an adequate amount of water/juice/gatorade (you may choose which of these liquids to drink) over the next 2-3 hours. You should expect  results within 1 to 6 hours after completing the bowel purge. Go to the er if you have severe AB pain, can not pass gas or stool in over 12 hours, can not hold down any food.    Linzess 145 mcg samples, can go up to 290 mcg or down to 72 mcg *IBS-C patients may begin to experience relief from belly pain and overall abdominal symptoms (pain, discomfort, and bloating) in about 1 week,  with symptoms typically improving over 12 weeks.  Take at least 30 minutes before the first meal of the day on an empty stomach You can have a loose stool if you eat a high-fat breakfast. Give it at least 7 days, may have more bowel movements during that time.   The diarrhea should go away and you should start having normal, complete, full bowel movements.  It may be helpful to start treatment when you can be near the comfort of your own bathroom, such as a weekend.  After you are out we can send in a prescription if you did well, there is a prescription card    Consider pelvic floor physical therapy.   Pelvic Floor Dysfunction, Female Pelvic floor dysfunction (PFD) is a condition that results when the group of muscles and connective tissues that support the organs in the pelvis (pelvic floor muscles) do not work well. These muscles and their connections form a sling that supports  the colon and bladder. In women, they also support the uterus. PFD causes pelvic floor muscles to be too weak, too tight, or both. In PFD, muscle movements are not coordinated. This may cause bowel or bladder problems. It may also cause pain. What are the causes? This condition may be caused by an injury to the pelvic area or by a weakening of pelvic muscles. This often results from pregnancy and childbirth or other types of strain. In many cases, the exact cause is not known. What increases the risk? The following factors may make you more likely to develop this condition: Having chronic bladder tissue inflammation (interstitial  cystitis). Being an older person. Being overweight. History of radiation treatment for cancer in the pelvic region. Previous pelvic surgery, such as removal of the uterus (hysterectomy). What are the signs or symptoms? Symptoms of this condition vary and may include: Bladder symptoms, such as: Trouble starting urination and emptying the bladder. Frequent urinary tract infections. Leaking urine when coughing, laughing, or exercising (stress incontinence). Having to pass urine urgently or frequently. Pain when passing urine. Bowel symptoms, such as: Constipation. Urgent or frequent bowel movements. Incomplete bowel movements. Painful bowel movements. Leaking stool or gas. Unexplained genital or rectal pain. Genital or rectal muscle spasms. Low back pain. Other symptoms may include: A heavy, full, or aching feeling in the vagina. A bulge that protrudes into the vagina. Pain during or after sex. How is this diagnosed? This condition may be diagnosed based on: Your symptoms and medical history. A physical exam. During the exam, your health care provider may check your pelvic muscles for tightness, spasm, pain, or weakness. This may include a rectal exam and a pelvic exam. In some cases, you may have diagnostic tests, such as: Electrical muscle function tests. Urine flow testing. X-ray tests of bowel function. Ultrasound of the pelvic organs. How is this treated? Treatment for this condition depends on the symptoms. Treatment options include: Physical therapy. This may include Kegel exercises to help relax or strengthen the pelvic floor muscles. Biofeedback. This type of therapy provides feedback on how tight your pelvic floor muscles are so that you can learn to control them. Internal or external massage therapy. A treatment that involves electrical stimulation of the pelvic floor muscles to help control pain (transcutaneous electrical nerve stimulation, or TENS). Sound wave  therapy (ultrasound) to reduce muscle spasms. Medicines, such as: Muscle relaxants. Bladder control medicines. Surgery to reconstruct or support pelvic floor muscles may be an option if other treatments do not help. Follow these instructions at home: Activity Do your usual activities as told by your health care provider. Ask your health care provider if you should modify any activities. Do pelvic floor strengthening or relaxing exercises at home as told by your physical therapist. Lifestyle Maintain a healthy weight. Eat foods that are high in fiber, such as beans, whole grains, and fresh fruits and vegetables. Limit foods that are high in fat and processed sugars, such as fried or sweet foods. Manage stress with relaxation techniques such as yoga or meditation. General instructions If you have problems with leakage: Use absorbable pads or wear padded underwear. Wash frequently with mild soap. Keep your genital and anal area as clean and dry as possible. Ask your health care provider if you should try a barrier cream to prevent skin irritation. Take warm baths to relieve pelvic muscle tension or spasms. Take over-the-counter and prescription medicines only as told by your health care provider. Keep all follow-up visits. How  is this prevented? The cause of PFD is not always known, but there are a few things you can do to reduce the risk of developing this condition, including: Staying at a healthy weight. Getting regular exercise. Managing stress. Contact a health care provider if: Your symptoms are not improving with home care. You have signs or symptoms of PFD that get worse at home. You develop new signs or symptoms. You have signs of a urinary tract infection, such as: Fever. Chills. Increased urinary frequency. A burning feeling when urinating. You have not had a bowel movement in 3 days (constipation). Summary Pelvic floor dysfunction results when the muscles and  connective tissues in your pelvic floor do not work well. These muscles and their connections form a sling that supports your colon and bladder. In women, they also support the uterus. PFD may be caused by an injury to the pelvic area or by a weakening of pelvic muscles. PFD causes pelvic floor muscles to be too weak, too tight, or a combination of both. Symptoms may vary from person to person. In most cases, PFD can be treated with physical therapies and medicines. Surgery may be an option if other treatments do not help. This information is not intended to replace advice given to you by your health care provider. Make sure you discuss any questions you have with your health care provider. Document Revised: 07/30/2020 Document Reviewed: 07/30/2020 Elsevier Patient Education  2022 [employer]  Reflux Gourmet Rescue  It is an ALGINATE THERAPY which is the only intervention that works to safeguard the esophagus by creating a protective barrier that actually stops reflux from happening. -The general directions for use are as stated on the packaging: Take 1 teaspoon (5 ml), or more as needed or as directed by your physician, after meals and before bed. -These general directions address the most common times for reflux to occur, but our Rescue products may be taken anytime. Some individuals may take our product preemptively, when they know they will suffer from reflux, or as needed - when discomfort arises. (If taken around food, it should be consumed last.) -You do not have to take 1 teaspoon (5 ml) of the product. While one teaspoon (5ml) may be the perfect average amount to relieve reflux suffering in some, others may require more or less. You may adjust the amount of Mint Chocolate Rescue and Vanilla Caramel Rescue to the lowest amount necessary to meet your individual needs to improve your quality of life. -You may dilute the product if it is too viscous for you to consume. Keep in mind, however,  that the thickness of the product was formulated to provide optimal coating and protection of your throat and esophagus. Though diluting the product is possible, it may reduce the protective function and/or length of action. -This can be used in conjunction with reflux medications and lifestyle changes.  100% ALL-NATURAL  Paraben FREE, glycerin FREE, & potassium FREE  Made entirely from all-natural ingredients considered safe for children and during pregnancy  No known side effects  All-natural flavor Gluten FREE  Allergen FREE  Vegan  Can find more information here: nameseizer.co.nz  Please follow up in 2 to 3 months.  Thank you for entrusting me with your care and for choosing [facility], Alan Coombs, P.A.-C

## 2024-03-06 NOTE — Progress Notes (Signed)
 ____________________________________________________________  Attending physician addendum:  Thank you for sending this case to me. I have reviewed the entire note and agree with the plan.  I agree this sounds likely pelvic structural and or pelvic floor dysfunction.  Victory Brand, MD  ____________________________________________________________

## 2024-03-07 ENCOUNTER — Other Ambulatory Visit: Payer: Self-pay | Admitting: Obstetrics and Gynecology

## 2024-03-07 DIAGNOSIS — N644 Mastodynia: Secondary | ICD-10-CM

## 2024-03-14 ENCOUNTER — Telehealth: Payer: Self-pay | Admitting: Physician Assistant

## 2024-03-14 MED ORDER — LINACLOTIDE 145 MCG PO CAPS: 145.0000 ug | ORAL_CAPSULE | Freq: Every day | ORAL | 2 refills | Status: AC

## 2024-03-14 NOTE — Telephone Encounter (Signed)
 Returned call to patient. Patient states that she started Linzess  145 mcg on 03/08/24 and then completed bowel purge. Patient feels that Linzess  145 mcg daily is working well for her. I told patient that since this dose is working for her we can send in a prescription. Advised patient to let us  know if anything changes and we can adjust meds as needed. Patient verbalized understanding and had no concerns at the end of the call.  Prescription for Linzess  145 mcg has been sent to [facility] pharmacy on file per patient request.

## 2024-03-14 NOTE — Telephone Encounter (Signed)
 Inbound call fro patient stating she would like to speak to nurse in regards to mediation linzess . Patient would like to know if she has to continue linzess  145mcg or start taking a lower dosage. Requesting a call back  Please advise  Thank you

## 2024-03-19 ENCOUNTER — Other Ambulatory Visit (HOSPITAL_BASED_OUTPATIENT_CLINIC_OR_DEPARTMENT_OTHER): Payer: Self-pay

## 2024-03-19 ENCOUNTER — Telehealth: Payer: Self-pay | Admitting: Physician Assistant

## 2024-03-19 NOTE — Telephone Encounter (Signed)
 Inbound call from patient stating that she was given Linzess  of 145 MCG and this past Saturday she has severe abdominal pain to the point she was unable to stand up straight. Patient is wondering if she can stop Linzess  and take miralax instead. Patient is requesting a call back. Please advise,

## 2024-03-19 NOTE — Telephone Encounter (Signed)
 Pt stated that she had an office visit on 03/05/2024 with Alan Coombs PA. On 03/07/2024 pt stated that she took a bowel purge and had some BM's after. Pt started taking the Linzess  on 03/09/2024.   Pt stated that she had off and on BM with it, nothing consistent.  Pt stated that on Friday 03/16/2024 she had severe abdominal pain to the point  that she was not able to stand up. Pt stated that she stopped taking the linzess  145. Abdominal pain has resolved. Pt stated that yesterday that she took a dose of MiraLAX and had a large BM.  Pt questioned if she could take the MiraLAX daily. Pt was notified that she could take the miraLAX daily.  Please review and advise on Linzess  145 mg daily.

## 2024-04-04 ENCOUNTER — Other Ambulatory Visit: Payer: Self-pay | Admitting: Obstetrics and Gynecology

## 2024-04-04 ENCOUNTER — Ambulatory Visit
Admission: RE | Admit: 2024-04-04 | Discharge: 2024-04-04 | Disposition: A | Discharge status: Home / Self Care | Source: Ambulatory Visit | Attending: Obstetrics and Gynecology | Admitting: Obstetrics and Gynecology

## 2024-04-04 DIAGNOSIS — R928 Other abnormal and inconclusive findings on diagnostic imaging of breast: Secondary | ICD-10-CM

## 2024-04-04 DIAGNOSIS — N644 Mastodynia: Secondary | ICD-10-CM

## 2024-04-16 ENCOUNTER — Ambulatory Visit: Admitting: Psychiatry

## 2024-04-16 NOTE — Progress Notes (Signed)
 NS.   [place] unusual

## 2024-04-17 ENCOUNTER — Encounter: Payer: Self-pay | Admitting: Psychiatry

## 2024-04-17 ENCOUNTER — Ambulatory Visit: Admitting: Psychiatry

## 2024-04-17 DIAGNOSIS — F5105 Insomnia due to other mental disorder: Secondary | ICD-10-CM

## 2024-04-17 DIAGNOSIS — R7989 Other specified abnormal findings of blood chemistry: Secondary | ICD-10-CM

## 2024-04-17 DIAGNOSIS — F900 Attention-deficit hyperactivity disorder, predominantly inattentive type: Secondary | ICD-10-CM

## 2024-04-17 DIAGNOSIS — F411 Generalized anxiety disorder: Secondary | ICD-10-CM

## 2024-04-17 DIAGNOSIS — F339 Major depressive disorder, recurrent, unspecified: Secondary | ICD-10-CM

## 2024-04-17 MED ORDER — PRAMIPEXOLE DIHYDROCHLORIDE 0.5 MG PO TABS: ORAL_TABLET | ORAL | 0 refills | Status: DC

## 2024-04-17 NOTE — Progress Notes (Signed)
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 education level: Not on file  Occupational History   Occupation: Contractor    Comment: Network engineer at [employer]  Tobacco Use   Smoking status: Former    Current packs/day: 0.00    Average packs/day: 0.8 packs/day for 36.0 years (27.0 ttl pk-yrs)    Types: Cigarettes    Start date: 17    Quit date: 2018    Years since quitting: 8.0   Smokeless tobacco: Never  Vaping Use   Vaping status: Never Used  Substance and Sexual Activity   Alcohol use: Yes    Alcohol/week: 1.0 standard drink of alcohol    Types: 1 Glasses of wine per week    Comment: one drink about once a month   Drug use: No   Sexual activity: Yes    Birth control/protection: Other-see comments    Comment: pt had hyst  Other Topics Concern   Not on file  Social History Narrative   Lives with husband   Social Drivers of Health   Tobacco Use: Medium Risk (04/17/2024)   Patient History    Smoking Tobacco Use: Former    Smokeless Tobacco Use: Never    Passive Exposure: Not on file  Financial Resource Strain: Low Risk (01/30/2024)   Received from [facility]   Overall Financial Resource Strain (CARDIA)    How hard is it for you to pay for the very basics like food, housing, medical care, and heating?: Not hard at all  Food Insecurity: Patient Declined (01/30/2024)   Received  from [facility]   Epic    Within the past 12 months, you worried that your food would run out before you got the money to buy more.: Patient declined    Within the past 12 months, the food you bought just didn't last and you didn't have money to get more.: Patient declined  Transportation Needs: Patient Declined (01/30/2024)   Received from [facility]    In the past 12 months, has lack of transportation kept you from medical appointments or from getting medications?: Patient declined    In the past 12 months, has lack of transportation kept you from meetings, work, or from getting things needed for daily living?: Patient declined  Physical Activity: Sufficiently Active (01/30/2024)   Received from [facility]  Exercise Vital Sign    On average, how many days per week do you engage in moderate to strenuous exercise (like a brisk walk)?: 4 days    On average, how many minutes do you engage in exercise at this level?: 60 min  Stress: Patient Declined (01/30/2024)   Received from [facility] Occupational [facility] - Occupational Stress Questionnaire    Do you feel stress - tense, restless, nervous, or anxious, or unable to sleep at night because your mind is troubled all the time - these days?: Patient declined  Social Connections: Socially Integrated (01/30/2024)   Received from [facility]   Social Network    How would you rate your social network (family, work, friends)?: Good participation with social networks  Intimate Partner Violence: Not At Risk (01/30/2024)   Received from [facility]   HITS    Over the last 12 months how often did your partner physically hurt you?: Never    Over the last 12 months how often did your partner insult you or talk down to you?: Never    Over the last 12 months how often did your partner threaten you with physical harm?: Never    Over the last 12 months how often did your partner scream or curse at you?: Never   Depression (PHQ2-9): Not on file  Alcohol Screen: Not on file  Housing: Low Risk (01/30/2024)   Received from [facility]    In the last 12 months, was there a time when you were not able to pay the mortgage or rent on time?: No    In the past 12 months, how many times have you moved where you were living?: 0    At any time in the past 12 months, were you homeless or living in a shelter (including now)?: No  Utilities: Patient Declined (01/30/2024)   Received from [facility]    In the past 12 months has the electric, gas, oil, or water company threatened to shut off services in your home?: Patient declined  Health Literacy: Not on file    Past Medical History, Surgical history, Social history, and Family history were reviewed and updated as appropriate.   Please see review of systems for further details on the patient's review from today.   Objective:   Physical Exam:  LMP 05/20/2010   Physical Exam Constitutional:      General: She is not in acute distress.    Appearance: She is well-developed.  Musculoskeletal:        General: No deformity.  Neurological:     Mental Status: She is alert and oriented to person, place, and time.     Cranial Nerves: No dysarthria.     Coordination: Coordination normal.  Psychiatric:        Attention and Perception: Attention and perception normal. She does not perceive auditory or visual hallucinations.        Mood and Affect: Mood is anxious. Mood is not depressed. Affect is not labile, blunt or inappropriate.        Speech: Speech normal.        Behavior: Behavior normal. Behavior is not agitated. Behavior is cooperative.        Thought Content: Thought content normal. Thought content is not paranoid or delusional. Thought content does not include homicidal or suicidal ideation. Thought content does not include suicidal plan.        Cognition and Memory:  Cognition and memory normal.        Judgment: Judgment normal.      Comments: Insight intact SI resolved with lithium  Less dep on Vraylar       Lab Review:     Component Value Date/Time   NA 140 03/01/2020 1731   K 3.8 03/01/2020 1731   CL 102 03/01/2020 1731   CO2 25 03/01/2020 1731   GLUCOSE 92 03/01/2020 1731   BUN 9 03/01/2020 1731   CREATININE 1.17 (H) 03/01/2020 1731   CALCIUM 10.0 03/01/2020 1731   PROT 6.8 03/01/2020 1731   ALBUMIN 4.5 03/01/2020 1731   AST 28 03/01/2020 1731   ALT 23 01/21/2021 0957   ALKPHOS 68 03/01/2020 1731   BILITOT 0.8 03/01/2020 1731   GFRNONAA 55 (L) 03/01/2020 1731   GFRAA >60 10/10/2015 1115       Component Value Date/Time   WBC 7.3 03/01/2020 1731   RBC 5.12 (H) 03/01/2020 1731   HGB 16.5 (H) 03/01/2020 1731   HCT 50.9 (H) 03/01/2020 1731   PLT 211 03/01/2020 1731   MCV 99.4 03/01/2020 1731   MCH 32.2 03/01/2020 1731   MCHC 32.4 03/01/2020 1731   RDW 11.9 03/01/2020 1731   LYMPHSABS 1.2 03/01/2020 1731   MONOABS 0.4 03/01/2020 1731   EOSABS 0.1 03/01/2020 1731   BASOSABS 0.1 03/01/2020 1731    Lithium  Lvl  Date Value Ref Range Status  03/01/2020 0.41 (L) 0.60 - 1.[name] mmol/L Final    Comment:    Performed at [facility], [address], [place], [place] [address]     No results found for: PHENYTOIN, PHENOBARB, VALPROATE, CBMZ   Lithium  level received April 27, 2018 was 0.7.  This is probably adequate to help reduce the risk of relapse of depression post TMS.  Therefore no med changes.    Vitamin D  level April 27, 2018 on supplement was 60 and adequate. .res Assessment: Plan:    Taylar was seen today for follow-up, depression, anxiety, add and sleeping problem.  Diagnoses and all orders for this visit:  Recurrent major depression resistant to treatment -     pramipexole  (MIRAPEX ) 0.5 MG tablet; 1/2 tablet twice daily for 3 days, then 1 tablet twice daily for 4 days, then 1 and 1/2 tablet twice daily for a week, then 2 tablets twice daily.  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  Reviewed multiple failed medications noted above.   Has been more depressed   since off th e Vraylar  markedly.  Agrees to resume it.  Wellbutrin  reduced to 300 bc GTC sz of unknown origin.  Was better with 450 mg but OK so far with reduction. SZ WU normal   She feels her protein intake is adequate and her weight is stable.  Sleep bettter at present.  Her SI is gone after increasing lithium  to 450 mg for SI and depression.   DC Vraylar  1.5 mg twice weekly. Retry pramipexole  bc prior trial was below 2 mg daily.  Disc range 2-5 mg daily.   Disc SE including impulsivity.  We discussed the short-term risks associated with benzodiazepines including sedation and increased fall risk among others.  Discussed long-term side effect risk including dependence, potential withdrawal symptoms, and the potential eventual dose-related risk of dementia.  But recent studies from 2020 dispute  this association between benzodiazepines and dementia risk. Newer studies in 2020 do not support an association with dementia.  Counseled patient regarding potential benefits, risks, and side effects of lithium  to include potential risk of lithium  affecting thyroid  and renal function.  Discussed need for periodic lab monitoring to determine drug level and to assess for potential adverse effects.  Counseled patient regarding signs and symptoms of lithium  toxicity and advised that they notify office immediately or seek urgent medical attention if experiencing these signs and symptoms.  Patient advised to contact office with any questions or concerns.  Disc lithium  and toxicity. lithium  to morning to eliminate nocturia. Continue lithium  450 mg  resolved recurrent SI  Plan:  Switch lithium  to the AM to see if sleeps better. Stop  Vraylar . Start pramipexole  and increase as directed.  If no benefit, call, don't stop it.   She forgot to make the switch in lithium  timing after the last visit.  Vitamin D  5000 units daily  FU 3-4 mos  [name] Macintosh, MD, DFAPA  Please see After Visit Summary for patient specific instructions.   Future Appointments  Date Time Provider Department Center  05/07/2024  2:30 PM Craig Alan SAUNDERS, PA-C [facility]      No orders of the defined types were placed in this encounter.      [name] Macintosh, MD, DFAPA   -------------------------------

## 2024-04-17 NOTE — Patient Instructions (Addendum)
 Switch lithium  to the AM to see if sleeps better. Stop Vraylar . Start pramipexole  and increase as directed.  If no benefit, call, don't stop it.

## 2024-05-07 ENCOUNTER — Ambulatory Visit: Admitting: Physician Assistant

## 2024-05-11 ENCOUNTER — Other Ambulatory Visit: Payer: Self-pay | Admitting: Psychiatry

## 2024-05-11 DIAGNOSIS — F339 Major depressive disorder, recurrent, unspecified: Secondary | ICD-10-CM

## 2024-06-20 ENCOUNTER — Ambulatory Visit: Admitting: Psychiatry
